# Patient Record
Sex: Male | Born: 1940 | Race: White | Hispanic: No | Marital: Married | Smoking: Never smoker
Health system: Southern US, Community
[De-identification: ages and names within clinical notes are randomized; demographics above are authoritative.]

## PROBLEM LIST (undated history)

## (undated) DIAGNOSIS — Z8546 Personal history of malignant neoplasm of prostate: Secondary | ICD-10-CM

## (undated) DIAGNOSIS — R55 Syncope and collapse: Secondary | ICD-10-CM

## (undated) DIAGNOSIS — T7840XA Allergy, unspecified, initial encounter: Secondary | ICD-10-CM

## (undated) DIAGNOSIS — E785 Hyperlipidemia, unspecified: Secondary | ICD-10-CM

## (undated) DIAGNOSIS — I2510 Atherosclerotic heart disease of native coronary artery without angina pectoris: Secondary | ICD-10-CM

## (undated) DIAGNOSIS — C801 Malignant (primary) neoplasm, unspecified: Secondary | ICD-10-CM

## (undated) DIAGNOSIS — R011 Cardiac murmur, unspecified: Secondary | ICD-10-CM

## (undated) DIAGNOSIS — N189 Chronic kidney disease, unspecified: Secondary | ICD-10-CM

## (undated) DIAGNOSIS — H269 Unspecified cataract: Secondary | ICD-10-CM

## (undated) DIAGNOSIS — Z9289 Personal history of other medical treatment: Secondary | ICD-10-CM

## (undated) DIAGNOSIS — M1990 Unspecified osteoarthritis, unspecified site: Secondary | ICD-10-CM

## (undated) DIAGNOSIS — R001 Bradycardia, unspecified: Secondary | ICD-10-CM

## (undated) DIAGNOSIS — Z923 Personal history of irradiation: Secondary | ICD-10-CM

## (undated) HISTORY — DX: Personal history of malignant neoplasm of prostate: Z85.46

## (undated) HISTORY — DX: Malignant (primary) neoplasm, unspecified: C80.1

## (undated) HISTORY — DX: Chronic kidney disease, unspecified: N18.9

## (undated) HISTORY — DX: Unspecified cataract: H26.9

## (undated) HISTORY — PX: TONSILLECTOMY: SUR1361

## (undated) HISTORY — PX: PROSTATECTOMY: SHX69

## (undated) HISTORY — PX: SKIN GRAFT: SHX250

## (undated) HISTORY — PX: ROBOT ASSISTED LAPAROSCOPIC RADICAL PROSTATECTOMY: SHX5141

## (undated) HISTORY — DX: Cardiac murmur, unspecified: R01.1

## (undated) HISTORY — DX: Allergy, unspecified, initial encounter: T78.40XA

## (undated) HISTORY — DX: Unspecified osteoarthritis, unspecified site: M19.90

## (undated) HISTORY — PX: SHOULDER SURGERY: SHX246

---

## 1999-08-05 ENCOUNTER — Encounter: Payer: Self-pay | Admitting: Neurological Surgery

## 1999-08-05 ENCOUNTER — Ambulatory Visit (HOSPITAL_COMMUNITY): Admission: RE | Admit: 1999-08-05 | Discharge: 1999-08-05 | Payer: Self-pay | Admitting: Neurological Surgery

## 2005-01-30 ENCOUNTER — Ambulatory Visit: Payer: Self-pay

## 2005-08-02 ENCOUNTER — Ambulatory Visit: Payer: Self-pay | Admitting: Gastroenterology

## 2005-08-12 ENCOUNTER — Encounter (INDEPENDENT_AMBULATORY_CARE_PROVIDER_SITE_OTHER): Payer: Self-pay | Admitting: Specialist

## 2005-08-12 ENCOUNTER — Ambulatory Visit: Payer: Self-pay | Admitting: Gastroenterology

## 2006-02-04 ENCOUNTER — Ambulatory Visit: Payer: Self-pay

## 2006-04-02 ENCOUNTER — Ambulatory Visit: Payer: Self-pay | Admitting: Cardiology

## 2006-11-15 ENCOUNTER — Ambulatory Visit (HOSPITAL_COMMUNITY): Admission: RE | Admit: 2006-11-15 | Discharge: 2006-11-15 | Payer: Self-pay | Admitting: Gastroenterology

## 2007-01-08 ENCOUNTER — Ambulatory Visit (HOSPITAL_COMMUNITY): Admission: RE | Admit: 2007-01-08 | Discharge: 2007-01-08 | Payer: Self-pay | Admitting: Urology

## 2007-01-28 ENCOUNTER — Ambulatory Visit (HOSPITAL_COMMUNITY): Admission: RE | Admit: 2007-01-28 | Discharge: 2007-01-28 | Payer: Self-pay | Admitting: Urology

## 2007-02-06 ENCOUNTER — Encounter (INDEPENDENT_AMBULATORY_CARE_PROVIDER_SITE_OTHER): Payer: Self-pay | Admitting: Urology

## 2007-02-06 ENCOUNTER — Inpatient Hospital Stay (HOSPITAL_COMMUNITY): Admission: RE | Admit: 2007-02-06 | Discharge: 2007-02-07 | Payer: Self-pay | Admitting: Urology

## 2008-03-22 ENCOUNTER — Ambulatory Visit: Admission: RE | Admit: 2008-03-22 | Discharge: 2008-06-20 | Payer: Self-pay | Admitting: Radiation Oncology

## 2010-05-29 ENCOUNTER — Encounter (INDEPENDENT_AMBULATORY_CARE_PROVIDER_SITE_OTHER): Payer: Self-pay | Admitting: *Deleted

## 2010-06-22 ENCOUNTER — Encounter (INDEPENDENT_AMBULATORY_CARE_PROVIDER_SITE_OTHER): Payer: Self-pay | Admitting: *Deleted

## 2010-06-26 ENCOUNTER — Ambulatory Visit: Payer: Self-pay | Admitting: Gastroenterology

## 2010-07-11 ENCOUNTER — Ambulatory Visit: Payer: Self-pay | Admitting: Gastroenterology

## 2010-07-11 DIAGNOSIS — Z8546 Personal history of malignant neoplasm of prostate: Secondary | ICD-10-CM

## 2010-07-12 LAB — CONVERTED CEMR LAB
ALT: 15 units/L (ref 0–53)
AST: 22 units/L (ref 0–37)
Albumin: 4 g/dL (ref 3.5–5.2)
Alkaline Phosphatase: 38 units/L — ABNORMAL LOW (ref 39–117)
BUN: 9 mg/dL (ref 6–23)
Basophils Absolute: 0 10*3/uL (ref 0.0–0.1)
Basophils Relative: 1.2 % (ref 0.0–3.0)
Bilirubin, Direct: 0.1 mg/dL (ref 0.0–0.3)
CO2: 30 meq/L (ref 19–32)
Calcium: 9.3 mg/dL (ref 8.4–10.5)
Chloride: 105 meq/L (ref 96–112)
Creatinine, Ser: 1.1 mg/dL (ref 0.4–1.5)
Eosinophils Absolute: 0.1 10*3/uL (ref 0.0–0.7)
Eosinophils Relative: 2.1 % (ref 0.0–5.0)
Ferritin: 69.5 ng/mL (ref 22.0–322.0)
Folate: 16.7 ng/mL
GFR calc non Af Amer: 72.98 mL/min (ref >60)
Glucose, Bld: 86 mg/dL (ref 70–99)
HCT: 42 % (ref 39.0–52.0)
Hemoglobin: 14.6 g/dL (ref 13.0–17.0)
Iron: 89 ug/dL (ref 42–165)
Lymphocytes Relative: 22.1 % (ref 12.0–46.0)
Lymphs Abs: 0.7 10*3/uL (ref 0.7–4.0)
MCHC: 34.8 g/dL (ref 30.0–36.0)
MCV: 93.4 fL (ref 78.0–100.0)
Monocytes Absolute: 0.3 10*3/uL (ref 0.1–1.0)
Monocytes Relative: 7.6 % (ref 3.0–12.0)
Neutro Abs: 2.2 10*3/uL (ref 1.4–7.7)
Neutrophils Relative %: 67 % (ref 43.0–77.0)
Platelets: 197 10*3/uL (ref 150.0–400.0)
Potassium: 5.1 meq/L (ref 3.5–5.1)
RBC: 4.5 M/uL (ref 4.22–5.81)
RDW: 13.7 % (ref 11.5–14.6)
Saturation Ratios: 29.4 % (ref 20.0–50.0)
Sodium: 141 meq/L (ref 135–145)
TSH: 1.57 microintl units/mL (ref 0.35–5.50)
Total Bilirubin: 1.1 mg/dL (ref 0.3–1.2)
Total Protein: 6.3 g/dL (ref 6.0–8.3)
Transferrin: 216.3 mg/dL (ref 212.0–360.0)
Vitamin B-12: 671 pg/mL (ref 211–911)
WBC: 3.4 10*3/uL — ABNORMAL LOW (ref 4.5–10.5)

## 2010-10-02 NOTE — Letter (Signed)
Summary: Pre Visit Letter Revised  [facility]  [address] [place], [place] [address]   Phone: [phone]  Fax: [phone]        05/29/2010 MRN: [id number] [place] Under[place] Under[place] Underwood, [place]  Underwood             Procedure Date:  07-11-10   Welcome to the Gastroenterology Division at [facility].    You are scheduled to see a nurse for your pre-procedure visit on 06-26-10 at 8:30a.m. on the 3rd floor at [facility], [address]  We ask that you try to arrive at our office 15 minutes prior to your appointment time to allow for check-in.  Please take a minute to review the attached form.  If you answer "Yes" to one or more of the questions on the first page, we ask that you call the person listed at your earliest opportunity.  If you answer "No" to all of the questions, please complete the rest of the form and bring it to your appointment.    Your nurse visit will consist of discussing your medical and surgical history, your immediate family medical history, and your medications.   If you are unable to list all of your medications on the form, please bring the medication bottles to your appointment and we will list them.  We will need to be aware of both prescribed and over the counter drugs.  We will need to know exact dosage information as well.    Please be prepared to read and sign documents such as consent forms, a financial agreement, and acknowledgement forms.  If necessary, and with your consent, a friend or relative is welcome to sit-in on the nurse visit with you.  Please bring your insurance card so that we may make a copy of it.  If your insurance requires a referral to see a specialist, please bring your referral form from your primary care physician.  No co-pay is required for this nurse visit.     If you cannot keep your appointment, please call [phone] to cancel or reschedule prior to your appointment date.  This allows [place]  the opportunity to schedule an appointment for another patient in need of care.    Thank you for choosing [facility] for your medical needs.  We appreciate the opportunity to care for you.  Please visit [place] at our website  to learn more about our practice.  Sincerely, The Gastroenterology Division

## 2010-10-02 NOTE — Miscellaneous (Signed)
Summary: [facility] Previsit/prep  Clinical Lists Changes  Medications: Added new medication of MOVIPREP 100 GM  SOLR (PEG-KCL-NACL-NASULF-NA ASC-C) As per prep instructions. - Signed Rx of MOVIPREP 100 GM  SOLR (PEG-KCL-NACL-NASULF-NA ASC-C) As per prep instructions.;  #1 x 0;  Signed;  Entered by: Wyona Almas RN;  Authorized by: Mardella Layman MD [facility];  Method used: [facility] [address] [phone]*, [address], [place], [place], [place]  [address], Ph: [phone] or [phone], Fax: [phone] Observations: Added new observation of NKA: T (06/26/2010 8:19)    Prescriptions: MOVIPREP 100 GM  SOLR (PEG-KCL-NACL-NASULF-NA ASC-C) As per prep instructions.  #1 x 0   Entered by:   Wyona Almas RN   Authorized by:   Mardella Layman MD [facility]   Signed by:   Wyona Almas RN on 06/26/2010   Method used:   [facility]        [facility] [address] [phone]* (retail)       [address]       [place], [place]  [address]       Ph: [phone] or [phone]       Fax: [phone]   RxID:   [id number]

## 2010-10-02 NOTE — Letter (Signed)
Summary: [facility] Instructions  [facility]  [address] [place], [place] [address]   Phone: [phone]  Fax: [phone]       [place]    1940/12/27    MRN: [id number]        Procedure Day /Date:  07/11/10  Wednesday     Arrival Time:  12:30pm      Procedure Time: 1:30pm     Location of Procedure:                    _ x_  [facility] (4th Floor)                        PREPARATION FOR COLONOSCOPY WITH MOVIPREP   Starting 5 days prior to your procedure _11/4/11 _ do not eat nuts, seeds, popcorn, corn, beans, peas,  salads, or any raw vegetables.  Do not take any fiber supplements (e.g. Metamucil, Citrucel, and Benefiber).  THE DAY BEFORE YOUR PROCEDURE         DATE:  07/10/10 DAY: Tuesday  1.  Drink clear liquids the entire day-NO SOLID FOOD  2.  Do not drink anything colored red or purple.  Avoid juices with pulp.  No orange juice.  3.  Drink at least 64 oz. (8 glasses) of fluid/clear liquids during the day to prevent dehydration and help the prep work efficiently.  CLEAR LIQUIDS INCLUDE: Water Jello Ice Popsicles Tea (sugar ok, no milk/cream) Powdered fruit flavored drinks Coffee (sugar ok, no milk/cream) Gatorade Juice: apple, white grape, white cranberry  Lemonade Clear bullion, consomm, broth Carbonated beverages (any kind) Strained chicken noodle soup Hard Candy                             4.  In the morning, mix first dose of MoviPrep solution:    Empty 1 Pouch A and 1 Pouch B into the disposable container    Add lukewarm drinking water to the top line of the container. Mix to dissolve    Refrigerate (mixed solution should be used within 24 hrs)  5.  Begin drinking the prep at 5:00 p.m. The MoviPrep container is divided by 4 marks.   Every 15 minutes drink the solution down to the next mark (approximately 8 oz) until the full liter is complete.   6.  Follow completed prep with 16 oz of clear liquid of your choice  (Nothing red or purple).  Continue to drink clear liquids until bedtime.  7.  Before going to bed, mix second dose of MoviPrep solution:    Empty 1 Pouch A and 1 Pouch B into the disposable container    Add lukewarm drinking water to the top line of the container. Mix to dissolve    Refrigerate  THE DAY OF YOUR PROCEDURE      DATE:  07/11/10  DAY:  Wednesday  Beginning at  8:30a.m. (5 hours before procedure):         1. Every 15 minutes, drink the solution down to the next mark (approx 8 oz) until the full liter is complete.  2. Follow completed prep with 16 oz. of clear liquid of your choice.    3. You may drink clear liquids until  11:30am  (2 HOURS BEFORE PROCEDURE).   MEDICATION INSTRUCTIONS  Unless otherwise instructed, you should take regular prescription medications with a small sip of water  as early as possible the morning of your procedure.        OTHER INSTRUCTIONS  You will need a responsible adult at least 69 years of age to accompany you and drive you home.   This person must remain in the waiting room during your procedure.  Wear loose fitting clothing that is easily removed.  Leave jewelry and other valuables at home.  However, you may wish to bring a book to read or  an iPod/MP3 player to listen to music as you wait for your procedure to start.  Remove all body piercing jewelry and leave at home.  Total time from sign-in until discharge is approximately 2-3 hours.  You should go home directly after your procedure and rest.  You can resume normal activities the  day after your procedure.  The day of your procedure you should not:   Drive   Make legal decisions   Operate machinery   Drink alcohol   Return to work  You will receive specific instructions about eating, activities and medications before you leave.    The above instructions have been reviewed and explained to me by  Wyona Almas RN  June 26, 2010 8:52 AM     I fully  understand and can verbalize these instructions _____________________________ Date _________

## 2010-10-02 NOTE — Procedures (Signed)
Summary: Colonoscopy  Patient: Coleby Yett Note: All result statuses are Final unless otherwise noted.  Tests: (1) Colonoscopy (COL)   COL Colonoscopy           DONE (C)     [facility]     [address]     [place], [place]  [address]           COLONOSCOPY PROCEDURE REPORT           PATIENT:  Anuar, Walgren  MR#:  [id number]     BIRTHDATE:  April 17, 1941, 69 yrs. old  GENDER:  male     ENDOSCOPIST:  Vania Rea. Jarold Motto, MD, [facility]     REF. BY:  Vania Rea. Jarold Motto, M.D.     PROCEDURE DATE:  07/11/2010     PROCEDURE:  Higher-risk screening colonoscopy G0105           ASA CLASS:  Class II     INDICATIONS:  Elevated Risk Screening AUNT WITH COLON CA.HX. OF     PROSRATE CA AND PRIOR SURGERY, AND EXTERNAL BEAM RADIATION.     MEDICATIONS:   Fentanyl 100 mcg IV, Versed 9 mg IV           DESCRIPTION OF PROCEDURE:   After the risks benefits and     alternatives of the procedure were thoroughly explained, informed     consent was obtained.  Digital rectal exam was performed and     revealed no abnormalities.   The LB 180AL [id number] endoscope was     introduced through the anus and advanced to the cecum, which was     identified by both the appendix and ileocecal valve, without     limitations.  The quality of the prep was good, using MoviPrep.     The instrument was then slowly withdrawn as the colon was fully     examined.     <<PROCEDUREIMAGES>>           FINDINGS:  No polyps or cancers were seen.  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     VASCULAR TELANGIECTASIAE IN DISTAL 5 CM. OF RECTUM.NO BLEEDING OR     FRIABILITY NOTED.SEE PICTURES.   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    The scope was then withdrawn from     the patient and the procedure completed.           COMPLICATIONS:  None     ENDOSCOPIC IMPRESSION:     1) No polyps or cancers     2) Radiation proctitis     RECOMMENDATIONS:  1) Continue current colorectal screening recommendations for     "routine risk" patients with a repeat colonoscopy in 10 years.     3 NEGATIVE COLON EXAMS OVER 17 YEARS.     REPEAT EXAM:  No           ______________________________     Vania Rea. Jarold Motto, MD, [facility]           CC:  Rodrigo Ran, MD           n.     REVISED:  07/11/2010 02:15 PM     eSIGNED:   Vania Rea. Olivya Sobol at 07/11/2010 02:15 PM           [name], [id number]  Note: An exclamation mark (!) indicates a result that was not dispersed into the flowsheet. 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) Orders: Added new Test order of TLB-B12, Serum-Total ONLY [phone]) - Signed Added new Test order of TLB-Ferritin (82728-FER) - Signed Added new Test order of TLB-Folic Acid (Folate) (82746-FOL) - Signed Added new Test order of TLB-Iron, (Fe) Total (83540-FE) - Signed Added new Test order of TLB-IBC Pnl (Iron/FE;Transferrin) (83550-IBC) - Signed Added new Test order of TLB-CBC Platelet - w/Differential (85025-CBCD) - Signed Added new Test order of TLB-TSH (Thyroid Stimulating Hormone) (84443-TSH) - Signed Added new Test order of TLB-BMP (Basic Metabolic Panel-BMET) (80048-METABOL) - Signed Added new Test order of TLB-Hepatic/Liver Function Pnl (80076-HEPATIC) - Signed      Appended Document: Colonoscopy    Clinical Lists Changes  Observations: Added new observation of COLONNXTDUE: 07/2020  (07/11/2010 15:08)

## 2010-12-18 ENCOUNTER — Other Ambulatory Visit: Payer: Self-pay | Admitting: Gastroenterology

## 2010-12-18 MED ORDER — MESALAMINE 1000 MG RE SUPP: 1000.0000 mg | Freq: Every day | RECTAL | Status: DC

## 2010-12-18 NOTE — Telephone Encounter (Signed)
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 I prescribed Canasa 1 g suppositories at bedtime as needed

## 2011-01-15 NOTE — Op Note (Signed)
NAME:  [place], [place] Under[place]              ACCOUNT NO.:  [id number]   MEDICAL RECORD NO.:  [id number]          PATIENT TYPE:  INP   LOCATION:  0001                         FACILITY:  [facility]   PHYSICIAN:  Ronald L. Earlene Plater, M.D.  DATE OF BIRTH:  1940/11/25   DATE OF PROCEDURE:  02/06/2007  DATE OF DISCHARGE:                               OPERATIVE REPORT   PREOPERATIVE DIAGNOSIS:  Adenocarcinoma of the prostate.   POSTOPERATIVE DIAGNOSIS:  Adenocarcinoma of the prostate.   OPERATIVE PROCEDURE:  Robotic radical prostatectomy with bilateral  pelvic lymphadenectomy.   SURGEON:  Lucrezia Starch. Earlene Plater, M.D.   ASSISTANT:  Heloise Purpura, M.D.   ANESTHESIA:  General endotracheal.   ESTIMATED BLOOD LOSS:  150 mL.   TUBES:  20-French Coude Foley catheter and large round Blake drain.   COMPLICATIONS:  None.   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  On  examination he had some firmness on the right apical and middle portion  of his prostate.  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  Metastatic workup was normal,  and after  understanding the risks, benefits, and alternatives, he has elected to  proceed with the above procedure.   DESCRIPTION OF PROCEDURE:  The patient was placed in the supine position  after proper general endotracheal anesthesia.  He was placed in  exaggerated lithotomy position and prepped and draped with Betadine in a  sterile fashion.  A 22-French Foley catheter was inserted.  A 30 mL  balloon in the bladder was drained.   The left periumbilical incision approximately 1.5 cm in diameter was  made.  With sharp dissection, this was carried down.  The anterior  rectus sheath was incised.  The peritoneum was punctured, and a 12-mm  port was placed and the peritoneum  was insufflated with carbon dioxide.  Next under direct vision, right and left robotic arm ports were placed  for 8 mm under direct vision as was the fourth arm port lateral to the  left arm port.  Two working ports were placed lateral and medial to the  right robotic arm port.  The right port was 12 mm and the left port was  5 mm.  Each had been injected with Xylocaine with epinephrine  previously, and the spot was visualized intraperitoneally.  The machine  was connected and the dissection was begun.  The bladder was filled with  approximately 200 mL of sterile water.  The anterior bladder flap was  taken down.  The space of Retzius was entered and dissected free of the  prostate.  The endopelvic fascia was taken down bilaterally.  The  puboprostatic ligaments were partially incised.  The pudendal vessels  were taken down lateral to the apex of the prostate, and the dorsal vein  complex was clipped with an Endo GIA staple, creating mobility of the  prostate.  Next, the bladder neck was approached and resected sharply  with  the hot scissors down to the Foley catheter.  It was used as a  traction device.  The posterior prostate was taken down.  The ampulla  and the vas deferens were dissected free and incised, and both seminal  vesicles were dissected and retracted anteriorly.  Denonvilliers' fascia  was then taken down posteriorly to the apex of prostate and endopelvic  fascia, and the lateral pedicles were developed.  Next utilizing cold  scissors, the neurovascular bundles were taken down posterolaterally  bilaterally utilizing a Vail technique, and the pedicles were taken in  serial clips with Hem-o-lock clips and taken down, maintaining a good  margin on the prostate but also maintaining the neurovascular bundles  intact.  This was taken down to the apex of the prostate.  The urethra  was sharply incised with the cold shears.  The specimen was placed in  the right lower quadrant.   Good hemostasis was noted to be present.  The  pelvis was irrigated, and the rectum was insufflated with air and there  was no leakage noted.  Next, bilateral pelvic lymphadenectomy was  performed.  The right pelvic lymphadenectomy was first performed.  All  lymphatics were clipped.  The obturator nerve was identified and  protected, and both obturator and external iliac nodes were taken and  grasped with the scoop and sent to pathology.  A similar node dissection  was performed on the left side at the apex.  The nerve was adherent to  the pedicle of the lymph nodes.  A Hem-o-lock clip was placed initially  across the nerve, was quickly removed, and the nerve was dissected off  and noted to be totally intact.  The specimen was submitted in a similar  manner.  Good hemostasis was noted to be present.  An ampule of indigo  carmine was given IV, and the anastomosis was performed.  A support  stitch was placed with a 3-0 Vicryl suture, tacking Denonvilliers'  fascia to the posterior urethral plate to decrease mobility.  A holding  stitch was then placed in the 6 o'clock position from the urethra to the  bladder neck, and the bladder neck anastomosis was performed with a  running 3-0 Monocryl suture, utilizing dyed and undyed suture with the  knot below the bladder neck.  The suture was tied anteriorly.  A 20-  French Coude catheter was passed.  The bladder was noted to irrigate  clear with blue dye.  Good hemostasis was noted to be present.  There  was no leakage.  A large round Blake drain was placed through the fourth  arm port and positioned in the pelvis.  The specimen was grasped with  the EndoCatch bag through the port, and under direct vision, the 12-mm  right working port was closed with a suture passer and 2-0 Vicryl  suture.  All ports were removed, and it was visualized that good  hemostasis was present.  The specimen was removed through the periumbilical camera port.  The fascia  was well-visualized and closed  with a running 2-0 Vicryl suture.  Each wound was thoroughly irrigated.  Good hemostasis was noted be present.  They were injected with 0.25%  Marcaine, and the skin was closed with skin staples.  The specimen was  submitted to pathology.   The patient was taken to the recovery room stable.      Ronald L. Earlene Plater, M.D.  Electronically Signed     RLD/MEDQ  D:  02/06/2007  T:  02/06/2007  Job:  [id number]

## 2011-01-18 NOTE — Assessment & Plan Note (Signed)
[facility]                            CARDIOLOGY OFFICE NOTE   [place] Under[place], [place] Underwood                       MRN:          [id number]  DATE:09/29/2006                            DOB:          11-26-1940    I spoke with [name] today about his Lipitor.  This is being  given for primary prevention.  He is having some potential side effects  from Lipitor with a low testosterone and some sexual dysfunction.  He  questions whether these effects may be related to the Lipitor and  whether his risk profile is low enough that he could come off of the  Lipitor.  We performed a risk profile on [place] Underwood through 1 of the web  sites, and his risk for cardiovascular events over the next 10 years is  relatively low (about 9%).  I think this is sufficiently low that it is  quite reasonable to stop the Lipitor, especially in view of the fact  that he is having some side effects.  I have recommended he have a  followup lipid profile in 4 to 8 weeks and we can reassess his lipid  profile and his risk at that time.     Bruce Elvera Lennox Juanda Chance, MD, [facility]  Electronically Signed    BRB/MedQ  DD: 09/29/2006  DT: 09/29/2006  Job #: [id number]

## 2011-01-18 NOTE — Assessment & Plan Note (Signed)
[facility]                              CARDIOLOGY OFFICE NOTE   MAITLAND, LESIAK                  MRN:          [id number]  DATE:04/02/2006                            DOB:          [birth year]    PRIMARY CARE PHYSICIAN:  Loraine Leriche A. Perini, MD   CLINICAL HISTORY:  S[place] Underwood is 70 years old, and has no prior history  of heart disease.  He recently developed some mild symptoms of erectile  dysfunction, and knowing the association with vascular disease, he called me  about further evaluation.  We decided to evaluate him with a stress  Cardiolite scan.  He has had no symptoms of chest pain, shortness of breath  or palpitations.   PAST MEDICAL HISTORY:  His risk factor profile is positive only in that his  father had a heart attack in his 58s, but he lived to his 40s.  He also has  hyperlipidemia, which has been treated with Lipitor.   ADDENDUM:  S[place] Underwood had a history of a heart murmur heard by Dr. Waynard Edwards in  2006, and we did an echocardiogram, and this showed only some mild  thickening of the mitral valve.   CURRENT MEDICATIONS:  Include Lipitor, Actonel, calcium, vitamins and  aspirin.   PHYSICAL EXAMINATION:  VITAL SIGNS:  Blood pressure is 126/82, the pulse is  53 and regular.  NECK:  There was no venous distention.  The carotid pulses were full, and  there were no bruits.  CHEST:  Clear.  CARDIAC:  Rhythm was regular.  The first and second heart sounds were  normal.  I can hear no murmurs or gallops.  ABDOMEN:  Soft without organomegaly.  EXTREMITIES:  Peripheral pulses were full, and there is no peripheral edema.   An electrocardiogram today was normal.   The recent stress test showed some inferior thinning, but no redistribution  and no evidence of ischemia.  The exercise capacity was excellent, and there  were no ST-T changes or chest pain.  The ejection fraction was 56%.   IMPRESSION:  1.  Nonischemic stress Myoview with no  evidence of ischemia.  2.  Hyperlipidemia, treated.  3.  Positive family history for coronary heart disease.   RECOMMENDATIONS:  I reassured [place] Underwood that his stress test looked quite good,  and there was no evidence of any ischemic heart disease.  His risk factors  are under excellent control with a very good lipid profile, which showed a  total cholesterol of 138, an HDL of 53, and an LDL of 56.  He also had a  previous Lipomed which showed a good pattern with low risk for LDL particle  number and LDL size.  His blood pressure is also under good control, and he  is at ideal weight and exercises regularly.  I reassured him that there was  no evidence of any significant vascular disease at present, and encouraged  him to continue his primary risk factor modification.  I will plan to see  him back on a p.r.n. basis.  Bruce Elvera Lennox Juanda Chance, MD, [facility]    BRB/MedQ  DD:  04/02/2006  DT:  04/03/2006  Job #:  [id number]   cc:   Loraine Leriche A. Perini, MD

## 2011-06-20 LAB — DIFFERENTIAL
Eosinophils Relative: 0
Lymphocytes Relative: 15
Lymphs Abs: 1.3
Monocytes Absolute: 0.7
Monocytes Relative: 8
Neutro Abs: 6.7

## 2011-06-20 LAB — COMPREHENSIVE METABOLIC PANEL
Alkaline Phosphatase: 39
BUN: 17
Glucose, Bld: 109 — ABNORMAL HIGH
Potassium: 4.5
Total Protein: 7

## 2011-06-20 LAB — CBC
HCT: 38.9 — ABNORMAL LOW
HCT: 43.8
Hemoglobin: 13.3
Hemoglobin: 14.8
MCHC: 33.9
RBC: 4.26
RDW: 14
RDW: 14.1 — ABNORMAL HIGH
WBC: 8.8

## 2011-06-20 LAB — BASIC METABOLIC PANEL
Calcium: 8 — ABNORMAL LOW
GFR calc Af Amer: >60
GFR calc non Af Amer: >60
Glucose, Bld: 123 — ABNORMAL HIGH
Potassium: 3.7
Sodium: 135

## 2011-06-20 LAB — PROTIME-INR
INR: 1
Prothrombin Time: 13.1

## 2011-06-20 LAB — TYPE AND SCREEN

## 2011-08-15 ENCOUNTER — Ambulatory Visit (INDEPENDENT_AMBULATORY_CARE_PROVIDER_SITE_OTHER): Payer: Medicare Other | Admitting: Cardiovascular Disease

## 2011-08-15 ENCOUNTER — Encounter: Payer: Self-pay | Admitting: Cardiovascular Disease

## 2011-08-15 VITALS — BP 136/82 | HR 58 | Resp 18 | Ht 70.0 in | Wt 166.1 lb

## 2011-08-15 DIAGNOSIS — E785 Hyperlipidemia, unspecified: Secondary | ICD-10-CM | POA: Insufficient documentation

## 2011-08-15 DIAGNOSIS — I2510 Atherosclerotic heart disease of native coronary artery without angina pectoris: Secondary | ICD-10-CM

## 2011-08-15 MED ORDER — ATORVASTATIN CALCIUM 10 MG PO TABS: 10.0000 mg | ORAL_TABLET | Freq: Every day | ORAL | Status: DC

## 2011-08-15 NOTE — Patient Instructions (Signed)
Your physician recommends that you schedule a follow-up appointment with Dr. Excell Seltzer as needed after [facility].  Your physician has requested that you have en exercise stress myoview. For further information please visit [email or web address] Please follow instruction sheet, as given.

## 2011-08-15 NOTE — Progress Notes (Signed)
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 He is a very nice 70 year old gentleman who underwent a CT scan of the chest, abdomen, and pelvis approximately one month ago. This was done to followup prostate cancer. On that study there was incidental notation of "atheromatous plaque identified along the course of the coronary vessels." He presents today for further evaluation.  The patient is completely asymptomatic from a cardiovascular perspective. He is physically active with regular exercise. He is able to carry a golf bag and walk 18 holes. He also does aerobic exercise at the gym. He specifically denies exertional chest pain or pressure. He denies orthopnea, PND, palpitations, lightheadedness, or syncope. He follows a healthy diet.  The patient brings in recent lab work which shows a fasting glucose of 88, total cholesterol 216, triglycerides 84, HDL cholesterol 59, and LDL cholesterol 140.  Outpatient Encounter Prescriptions as of 08/15/2011  Medication Sig Dispense Refill  . aspirin 81 MG tablet Take 81 mg by mouth daily.        . calcium-vitamin D (OSCAL WITH D) 500-200 MG-UNIT per tablet Take 1 tablet by mouth daily.        . Misc Natural Products (OSTEO BI-FLEX ADV DOUBLE ST PO) Take by mouth.        . Misc Natural Products (RED WINE EXTRACT PO) Take by mouth.        . STUDY MEDICATION Drug: [place]  @@ [facility].       . vitamin E (VITAMIN E) 1000 UNIT capsule Take 1,000 Units by mouth daily.        Marland Kitchen zolpidem (AMBIEN) 10 MG tablet Take 10 mg by mouth as needed.       Marland Kitchen atorvastatin (LIPITOR) 10 MG tablet Take 1 tablet (10 mg total) by mouth daily.  30 tablet  11  . mesalamine (CANASA) 1000 MG suppository Place 1 suppository (1,000 mg total) rectally at bedtime.  30 suppository  1    Review of patient's allergies indicates no known allergies.  Past Medical History  Diagnosis Date  . Personal history of malignant neoplasm of prostate     No  past surgical history on file.  History   Social History  . Marital Status: Married    Spouse Name: N/A    Number of Children: N/A  . Years of Education: N/A   Occupational History  . Not on file.   Social History Main Topics  . Smoking status: Never Smoker   . Smokeless tobacco: Not on file  . Alcohol Use: Not on file  . Drug Use: Not on file  . Sexually Active: Not on file   Other Topics Concern  . Not on file   Social History Narrative  . No narrative on file    No family history on file.  ROS: General: no fevers/chills/night sweats Eyes: no blurry vision, diplopia, or amaurosis ENT: no sore throat or hearing loss Resp: no cough, wheezing, or hemoptysis CV: no edema or palpitations GI: no abdominal pain, nausea, vomiting, diarrhea, or constipation GU: no dysuria, frequency, or hematuria Skin: no rash Neuro: no headache, numbness, tingling, or weakness of extremities Musculoskeletal: no joint pain or swelling Heme: no bleeding, DVT, or easy bruising Endo: no polydipsia or polyuria  BP 136/82  Pulse 58  Resp 18  Ht 5\' 10"  (1.778 m)  Wt 75.352 kg (166 lb 1.9 oz)  BMI 23.84 kg/m2  PHYSICAL EXAM: Pt is alert and oriented, Physically fit, WD, WN,  in no distress. HEENT: normal Neck: JVP normal. Carotid upstrokes normal without bruits. No thyromegaly. Lungs: equal expansion, clear bilaterally CV: Apex is discrete and nondisplaced, RRR with a grade 2/6 systolic ejection murmur at the left sternal border Abd: soft, NT, +BS, no bruit, no hepatosplenomegaly Back: no CVA tenderness Ext: no C/C/E        Femoral pulses 2+= without bruits        DP/PT pulses intact and = Skin: warm and dry without rash Neuro: CNII-XII intact             Strength intact = bilaterally  EKG:  Sinus bradycardia 58 beats per minute, otherwise within normal limits.  ASSESSMENT AND PLAN:

## 2011-08-15 NOTE — Assessment & Plan Note (Signed)
This is a 70 year old gentleman with subclinical coronary atherosclerosis. We discussed the implications of this. He has previously been on atorvastatin and tolerated it relatively well. This was discontinued several years ago because overall benefit was questioned. Now that the patient has a diagnosis of coronary disease, I would recommend treating him to a goal LDL of less than 100. We discussed a trial of low dose atorvastatin 10 mg. His lipids are followed regularly by Dr. Waynard Edwards.  I have also recommended an exercise nuclear scan to rule out silent ischemia in this patient with CT evidence of coronary atherosclerosis. I will plan on seeing him back as needed as long as his nuclear study is normal. I would be happy to see him back in the future if problems arise.

## 2011-08-20 ENCOUNTER — Encounter (HOSPITAL_COMMUNITY): Payer: Medicare Other | Admitting: Radiology

## 2011-08-21 ENCOUNTER — Ambulatory Visit (HOSPITAL_COMMUNITY): Payer: Medicare Other | Attending: Cardiology | Admitting: Radiology

## 2011-08-21 VITALS — BP 126/70 | Ht 70.0 in | Wt 158.0 lb

## 2011-08-21 DIAGNOSIS — Z8249 Family history of ischemic heart disease and other diseases of the circulatory system: Secondary | ICD-10-CM | POA: Insufficient documentation

## 2011-08-21 DIAGNOSIS — R9439 Abnormal result of other cardiovascular function study: Secondary | ICD-10-CM

## 2011-08-21 DIAGNOSIS — I2510 Atherosclerotic heart disease of native coronary artery without angina pectoris: Secondary | ICD-10-CM | POA: Insufficient documentation

## 2011-08-21 DIAGNOSIS — E785 Hyperlipidemia, unspecified: Secondary | ICD-10-CM | POA: Insufficient documentation

## 2011-08-21 DIAGNOSIS — I4949 Other premature depolarization: Secondary | ICD-10-CM

## 2011-08-21 MED ORDER — TECHNETIUM TC 99M TETROFOSMIN IV KIT
11.0000 | PACK | Freq: Once | INTRAVENOUS | Status: AC | PRN
  Administered 2011-08-21: 11 via INTRAVENOUS

## 2011-08-21 MED ORDER — TECHNETIUM TC 99M TETROFOSMIN IV KIT
33.0000 | PACK | Freq: Once | INTRAVENOUS | Status: AC | PRN
  Administered 2011-08-21: 33 via INTRAVENOUS

## 2011-08-21 NOTE — Progress Notes (Signed)
[facility] SITE 3 NUCLEAR MED [address] [place] [address] [phone]  Cardiology Nuclear Med Study  Spencer Underwood is a 70 y.o. male [id number] 17-Feb-1941   Nuclear Med Background Indication for Stress Test:  Evaluation for Ischemia and Recent CT scan with evidence of coronary atherosclerosis History:  '06 Echo:Normal LVF; '07 MPS:No ischemia, EF=56% Cardiac Risk Factors: Family History - CAD and Lipids  Symptoms:  No cardiac symptoms.   Nuclear Pre-Procedure Caffeine/Decaff Intake:  None NPO After: 9:00pm   Lungs:  Clear IV 0.9% NS with Angio Cath:  20g  IV Site: L Antecubital x 1, tolerated well IV Started by:  Irean Hong, RN  Chest Size (in):  40 Cup Size: N/A  Height: 5\' 10"  (1.778 m)  Weight:  158 lb (71.668 kg)  BMI:  Body mass index is 22.67 kg/(m^2). Tech Comments:  N/A    Nuclear Med Study 1 or 2 day study: 1 day  Stress Test Type:  Stress  Reading MD: Willa Rough, MD  Order Authorizing Provider:  Tonny Bollman, MD  Resting Radionuclide: Technetium 23m Tetrofosmin  Resting Radionuclide Dose: 11.0 mCi   Stress Radionuclide:  Technetium 75m Tetrofosmin  Stress Radionuclide Dose: 33.0 mCi           Stress Protocol Rest HR: 44 Stress HR: 131  Rest BP: Sitting:126/70  Standing:124/85 Stress BP: 161/72  Exercise Time (min): 12:01 METS: 13.4   Predicted Max HR: 151 bpm % Max HR: 86.75 bpm Rate Pressure Product: 52841   Dose of Adenosine (mg):  n/a Dose of Lexiscan: n/a mg  Dose of Atropine (mg): n/a Dose of Dobutamine: n/a mcg/kg/min (at max HR)  Stress Test Technologist: Smiley Houseman, CMA-N  Nuclear Technologist:  Domenic Polite, CNMT     Rest Procedure:  Myocardial perfusion imaging was performed at rest 45 minutes following the intravenous administration of Technetium 75m Tetrofosmin.   Rest ECG: Marked sinus bradycardia.  Stress Procedure:  The patient exercised for twelve minutes on the treadmill utilizing the  Bruce protocol.  The patient stopped due to fatigue and denied any chest pain.  There were no diagnostic ST-T wave changes.  There were occasional PAC's.  Technetium 19m Tetrofosmin was injected at peak exercise and myocardial perfusion imaging was performed after a brief delay.  Stress ECG: No significant change from baseline ECG  QPS Raw Data Images:  Normal; no motion artifact; normal heart/lung ratio. Stress Images:  Mild septal and inferior thinning Rest Images:  Same as stress Subtraction (SDS):  No evidence of ischemia. Transient Ischemic Dilatation (Normal <1.22):  0.94 Lung/Heart Ratio (Normal <0.45):  0.18  Quantitative Gated Spect Images QGS EDV:  109 ml QGS ESV:  41 ml QGS cine images:  Normal Wall Motion QGS EF: 63%  Impression Exercise Capacity:  Excellent exercise capacity. BP Response:  Normal blood pressure response. Clinical Symptoms:  No chest pain. ECG Impression:  No significant ST segment change suggestive of ischemia. Comparison with Prior Nuclear Study: No significant change from previous study (2007)  Overall Impression:  Normal stress nuclear study. There is mild septal and inferiior thinning. This has been seen on prior studies. There is no change. I doubt any significant abnormality.  Willa Rough, MD

## 2011-09-10 DIAGNOSIS — Z8249 Family history of ischemic heart disease and other diseases of the circulatory system: Secondary | ICD-10-CM | POA: Diagnosis not present

## 2011-10-16 DIAGNOSIS — Z006 Encounter for examination for normal comparison and control in clinical research program: Secondary | ICD-10-CM | POA: Diagnosis not present

## 2011-10-16 DIAGNOSIS — C61 Malignant neoplasm of prostate: Secondary | ICD-10-CM | POA: Diagnosis not present

## 2011-10-25 ENCOUNTER — Encounter: Payer: Self-pay | Admitting: Cardiovascular Disease

## 2011-10-25 DIAGNOSIS — E785 Hyperlipidemia, unspecified: Secondary | ICD-10-CM | POA: Diagnosis not present

## 2011-11-05 DIAGNOSIS — H259 Unspecified age-related cataract: Secondary | ICD-10-CM | POA: Diagnosis not present

## 2011-11-05 DIAGNOSIS — H52209 Unspecified astigmatism, unspecified eye: Secondary | ICD-10-CM | POA: Diagnosis not present

## 2011-11-05 DIAGNOSIS — H35379 Puckering of macula, unspecified eye: Secondary | ICD-10-CM | POA: Diagnosis not present

## 2011-11-14 DIAGNOSIS — H35379 Puckering of macula, unspecified eye: Secondary | ICD-10-CM | POA: Diagnosis not present

## 2011-11-14 DIAGNOSIS — H2510 Age-related nuclear cataract, unspecified eye: Secondary | ICD-10-CM | POA: Diagnosis not present

## 2012-01-08 DIAGNOSIS — C61 Malignant neoplasm of prostate: Secondary | ICD-10-CM | POA: Diagnosis not present

## 2012-01-08 DIAGNOSIS — Z006 Encounter for examination for normal comparison and control in clinical research program: Secondary | ICD-10-CM | POA: Diagnosis not present

## 2012-03-02 DIAGNOSIS — H2510 Age-related nuclear cataract, unspecified eye: Secondary | ICD-10-CM | POA: Diagnosis not present

## 2012-03-02 DIAGNOSIS — H35379 Puckering of macula, unspecified eye: Secondary | ICD-10-CM | POA: Diagnosis not present

## 2012-04-08 DIAGNOSIS — Z006 Encounter for examination for normal comparison and control in clinical research program: Secondary | ICD-10-CM | POA: Diagnosis not present

## 2012-04-08 DIAGNOSIS — C61 Malignant neoplasm of prostate: Secondary | ICD-10-CM | POA: Diagnosis not present

## 2012-05-12 DIAGNOSIS — Z23 Encounter for immunization: Secondary | ICD-10-CM | POA: Diagnosis not present

## 2012-05-25 DIAGNOSIS — C61 Malignant neoplasm of prostate: Secondary | ICD-10-CM | POA: Diagnosis not present

## 2012-05-25 DIAGNOSIS — Z8546 Personal history of malignant neoplasm of prostate: Secondary | ICD-10-CM | POA: Diagnosis not present

## 2012-05-25 DIAGNOSIS — Z006 Encounter for examination for normal comparison and control in clinical research program: Secondary | ICD-10-CM | POA: Diagnosis not present

## 2012-07-07 DIAGNOSIS — L821 Other seborrheic keratosis: Secondary | ICD-10-CM | POA: Diagnosis not present

## 2012-07-07 DIAGNOSIS — L570 Actinic keratosis: Secondary | ICD-10-CM | POA: Diagnosis not present

## 2012-08-03 ENCOUNTER — Other Ambulatory Visit: Payer: Self-pay | Admitting: Cardiovascular Disease

## 2012-08-10 ENCOUNTER — Other Ambulatory Visit: Payer: Self-pay | Admitting: Cardiovascular Disease

## 2012-09-07 DIAGNOSIS — H35379 Puckering of macula, unspecified eye: Secondary | ICD-10-CM | POA: Diagnosis not present

## 2012-09-07 DIAGNOSIS — H2510 Age-related nuclear cataract, unspecified eye: Secondary | ICD-10-CM | POA: Diagnosis not present

## 2012-10-29 DIAGNOSIS — C61 Malignant neoplasm of prostate: Secondary | ICD-10-CM | POA: Diagnosis not present

## 2012-10-29 DIAGNOSIS — M810 Age-related osteoporosis without current pathological fracture: Secondary | ICD-10-CM | POA: Diagnosis not present

## 2012-11-09 DIAGNOSIS — N529 Male erectile dysfunction, unspecified: Secondary | ICD-10-CM | POA: Diagnosis not present

## 2012-11-11 DIAGNOSIS — C61 Malignant neoplasm of prostate: Secondary | ICD-10-CM | POA: Diagnosis not present

## 2012-11-11 DIAGNOSIS — Z79899 Other long term (current) drug therapy: Secondary | ICD-10-CM | POA: Diagnosis not present

## 2012-11-25 DIAGNOSIS — H52209 Unspecified astigmatism, unspecified eye: Secondary | ICD-10-CM | POA: Diagnosis not present

## 2012-11-25 DIAGNOSIS — H35379 Puckering of macula, unspecified eye: Secondary | ICD-10-CM | POA: Diagnosis not present

## 2012-11-25 DIAGNOSIS — H25019 Cortical age-related cataract, unspecified eye: Secondary | ICD-10-CM | POA: Diagnosis not present

## 2013-03-08 DIAGNOSIS — H2510 Age-related nuclear cataract, unspecified eye: Secondary | ICD-10-CM | POA: Diagnosis not present

## 2013-03-08 DIAGNOSIS — H35379 Puckering of macula, unspecified eye: Secondary | ICD-10-CM | POA: Diagnosis not present

## 2013-03-16 DIAGNOSIS — L821 Other seborrheic keratosis: Secondary | ICD-10-CM | POA: Diagnosis not present

## 2013-03-16 DIAGNOSIS — L820 Inflamed seborrheic keratosis: Secondary | ICD-10-CM | POA: Diagnosis not present

## 2013-04-07 ENCOUNTER — Other Ambulatory Visit: Payer: Self-pay | Admitting: *Deleted

## 2013-05-12 DIAGNOSIS — C61 Malignant neoplasm of prostate: Secondary | ICD-10-CM | POA: Diagnosis not present

## 2013-05-13 DIAGNOSIS — Z23 Encounter for immunization: Secondary | ICD-10-CM | POA: Diagnosis not present

## 2013-07-19 DIAGNOSIS — L570 Actinic keratosis: Secondary | ICD-10-CM | POA: Diagnosis not present

## 2013-07-19 DIAGNOSIS — L821 Other seborrheic keratosis: Secondary | ICD-10-CM | POA: Diagnosis not present

## 2013-07-19 DIAGNOSIS — D239 Other benign neoplasm of skin, unspecified: Secondary | ICD-10-CM | POA: Diagnosis not present

## 2013-07-19 DIAGNOSIS — D1801 Hemangioma of skin and subcutaneous tissue: Secondary | ICD-10-CM | POA: Diagnosis not present

## 2013-07-19 DIAGNOSIS — L738 Other specified follicular disorders: Secondary | ICD-10-CM | POA: Diagnosis not present

## 2013-07-19 DIAGNOSIS — L819 Disorder of pigmentation, unspecified: Secondary | ICD-10-CM | POA: Diagnosis not present

## 2013-09-06 DIAGNOSIS — H2510 Age-related nuclear cataract, unspecified eye: Secondary | ICD-10-CM | POA: Diagnosis not present

## 2013-09-06 DIAGNOSIS — H35379 Puckering of macula, unspecified eye: Secondary | ICD-10-CM | POA: Diagnosis not present

## 2013-09-06 DIAGNOSIS — H33109 Unspecified retinoschisis, unspecified eye: Secondary | ICD-10-CM | POA: Diagnosis not present

## 2013-11-12 DIAGNOSIS — E785 Hyperlipidemia, unspecified: Secondary | ICD-10-CM | POA: Diagnosis not present

## 2013-11-12 DIAGNOSIS — Z79899 Other long term (current) drug therapy: Secondary | ICD-10-CM | POA: Diagnosis not present

## 2013-11-12 DIAGNOSIS — Z125 Encounter for screening for malignant neoplasm of prostate: Secondary | ICD-10-CM | POA: Diagnosis not present

## 2013-11-12 DIAGNOSIS — M810 Age-related osteoporosis without current pathological fracture: Secondary | ICD-10-CM | POA: Diagnosis not present

## 2013-11-17 DIAGNOSIS — C61 Malignant neoplasm of prostate: Secondary | ICD-10-CM | POA: Diagnosis not present

## 2013-11-19 DIAGNOSIS — M810 Age-related osteoporosis without current pathological fracture: Secondary | ICD-10-CM | POA: Diagnosis not present

## 2013-11-19 DIAGNOSIS — E785 Hyperlipidemia, unspecified: Secondary | ICD-10-CM | POA: Diagnosis not present

## 2013-11-19 DIAGNOSIS — C61 Malignant neoplasm of prostate: Secondary | ICD-10-CM | POA: Diagnosis not present

## 2013-11-19 DIAGNOSIS — K520 Gastroenteritis and colitis due to radiation: Secondary | ICD-10-CM | POA: Diagnosis not present

## 2013-11-19 DIAGNOSIS — R011 Cardiac murmur, unspecified: Secondary | ICD-10-CM | POA: Diagnosis not present

## 2013-11-19 DIAGNOSIS — Z1331 Encounter for screening for depression: Secondary | ICD-10-CM | POA: Diagnosis not present

## 2013-11-19 DIAGNOSIS — Z0000 Encounter for general adult medical examination without abnormal findings: Secondary | ICD-10-CM | POA: Diagnosis not present

## 2013-11-19 DIAGNOSIS — N529 Male erectile dysfunction, unspecified: Secondary | ICD-10-CM | POA: Diagnosis not present

## 2013-11-29 DIAGNOSIS — H52209 Unspecified astigmatism, unspecified eye: Secondary | ICD-10-CM | POA: Diagnosis not present

## 2013-11-29 DIAGNOSIS — H35379 Puckering of macula, unspecified eye: Secondary | ICD-10-CM | POA: Diagnosis not present

## 2013-11-29 DIAGNOSIS — H2510 Age-related nuclear cataract, unspecified eye: Secondary | ICD-10-CM | POA: Diagnosis not present

## 2014-03-21 DIAGNOSIS — H35379 Puckering of macula, unspecified eye: Secondary | ICD-10-CM | POA: Diagnosis not present

## 2014-03-21 DIAGNOSIS — H2510 Age-related nuclear cataract, unspecified eye: Secondary | ICD-10-CM | POA: Diagnosis not present

## 2014-03-21 DIAGNOSIS — H33109 Unspecified retinoschisis, unspecified eye: Secondary | ICD-10-CM | POA: Diagnosis not present

## 2014-04-10 ENCOUNTER — Other Ambulatory Visit: Payer: Self-pay | Admitting: Cardiovascular Disease

## 2014-05-23 DIAGNOSIS — Z23 Encounter for immunization: Secondary | ICD-10-CM | POA: Diagnosis not present

## 2014-05-23 DIAGNOSIS — C61 Malignant neoplasm of prostate: Secondary | ICD-10-CM | POA: Diagnosis not present

## 2014-07-05 DIAGNOSIS — H5213 Myopia, bilateral: Secondary | ICD-10-CM | POA: Diagnosis not present

## 2014-07-05 DIAGNOSIS — H25042 Posterior subcapsular polar age-related cataract, left eye: Secondary | ICD-10-CM | POA: Diagnosis not present

## 2014-07-05 DIAGNOSIS — H35372 Puckering of macula, left eye: Secondary | ICD-10-CM | POA: Diagnosis not present

## 2014-07-07 DIAGNOSIS — H33102 Unspecified retinoschisis, left eye: Secondary | ICD-10-CM | POA: Diagnosis not present

## 2014-07-07 DIAGNOSIS — H35372 Puckering of macula, left eye: Secondary | ICD-10-CM | POA: Diagnosis not present

## 2014-07-07 DIAGNOSIS — H2513 Age-related nuclear cataract, bilateral: Secondary | ICD-10-CM | POA: Diagnosis not present

## 2014-07-11 DIAGNOSIS — C61 Malignant neoplasm of prostate: Secondary | ICD-10-CM | POA: Diagnosis not present

## 2014-07-11 DIAGNOSIS — R935 Abnormal findings on diagnostic imaging of other abdominal regions, including retroperitoneum: Secondary | ICD-10-CM | POA: Diagnosis not present

## 2014-07-11 DIAGNOSIS — Z006 Encounter for examination for normal comparison and control in clinical research program: Secondary | ICD-10-CM | POA: Diagnosis not present

## 2014-07-19 DIAGNOSIS — D225 Melanocytic nevi of trunk: Secondary | ICD-10-CM | POA: Diagnosis not present

## 2014-07-19 DIAGNOSIS — L812 Freckles: Secondary | ICD-10-CM | POA: Diagnosis not present

## 2014-07-19 DIAGNOSIS — D1801 Hemangioma of skin and subcutaneous tissue: Secondary | ICD-10-CM | POA: Diagnosis not present

## 2014-07-19 DIAGNOSIS — L853 Xerosis cutis: Secondary | ICD-10-CM | POA: Diagnosis not present

## 2014-07-19 DIAGNOSIS — L821 Other seborrheic keratosis: Secondary | ICD-10-CM | POA: Diagnosis not present

## 2014-07-19 DIAGNOSIS — L570 Actinic keratosis: Secondary | ICD-10-CM | POA: Diagnosis not present

## 2014-07-20 DIAGNOSIS — Z006 Encounter for examination for normal comparison and control in clinical research program: Secondary | ICD-10-CM | POA: Diagnosis not present

## 2014-07-20 DIAGNOSIS — C61 Malignant neoplasm of prostate: Secondary | ICD-10-CM | POA: Diagnosis not present

## 2014-08-17 DIAGNOSIS — C61 Malignant neoplasm of prostate: Secondary | ICD-10-CM | POA: Diagnosis not present

## 2014-08-17 DIAGNOSIS — Z006 Encounter for examination for normal comparison and control in clinical research program: Secondary | ICD-10-CM | POA: Diagnosis not present

## 2014-09-14 DIAGNOSIS — C61 Malignant neoplasm of prostate: Secondary | ICD-10-CM | POA: Diagnosis not present

## 2014-09-14 DIAGNOSIS — Z006 Encounter for examination for normal comparison and control in clinical research program: Secondary | ICD-10-CM | POA: Diagnosis not present

## 2014-09-29 ENCOUNTER — Other Ambulatory Visit (HOSPITAL_COMMUNITY): Payer: Self-pay | Admitting: Cardiovascular Disease

## 2014-11-09 DIAGNOSIS — Z006 Encounter for examination for normal comparison and control in clinical research program: Secondary | ICD-10-CM | POA: Diagnosis not present

## 2014-11-09 DIAGNOSIS — C61 Malignant neoplasm of prostate: Secondary | ICD-10-CM | POA: Diagnosis not present

## 2014-11-09 DIAGNOSIS — R232 Flushing: Secondary | ICD-10-CM | POA: Diagnosis not present

## 2014-11-10 DIAGNOSIS — H25042 Posterior subcapsular polar age-related cataract, left eye: Secondary | ICD-10-CM | POA: Diagnosis not present

## 2014-11-10 DIAGNOSIS — H25812 Combined forms of age-related cataract, left eye: Secondary | ICD-10-CM | POA: Diagnosis not present

## 2014-12-05 DIAGNOSIS — E785 Hyperlipidemia, unspecified: Secondary | ICD-10-CM | POA: Diagnosis not present

## 2014-12-05 DIAGNOSIS — R7301 Impaired fasting glucose: Secondary | ICD-10-CM | POA: Diagnosis not present

## 2014-12-05 DIAGNOSIS — M810 Age-related osteoporosis without current pathological fracture: Secondary | ICD-10-CM | POA: Diagnosis not present

## 2014-12-05 DIAGNOSIS — Z125 Encounter for screening for malignant neoplasm of prostate: Secondary | ICD-10-CM | POA: Diagnosis not present

## 2014-12-05 DIAGNOSIS — Z008 Encounter for other general examination: Secondary | ICD-10-CM | POA: Diagnosis not present

## 2014-12-12 DIAGNOSIS — Z0000 Encounter for general adult medical examination without abnormal findings: Secondary | ICD-10-CM | POA: Diagnosis not present

## 2014-12-12 DIAGNOSIS — D126 Benign neoplasm of colon, unspecified: Secondary | ICD-10-CM | POA: Diagnosis not present

## 2014-12-12 DIAGNOSIS — R011 Cardiac murmur, unspecified: Secondary | ICD-10-CM | POA: Diagnosis not present

## 2014-12-12 DIAGNOSIS — C61 Malignant neoplasm of prostate: Secondary | ICD-10-CM | POA: Diagnosis not present

## 2014-12-12 DIAGNOSIS — R7301 Impaired fasting glucose: Secondary | ICD-10-CM | POA: Diagnosis not present

## 2014-12-12 DIAGNOSIS — N529 Male erectile dysfunction, unspecified: Secondary | ICD-10-CM | POA: Diagnosis not present

## 2014-12-12 DIAGNOSIS — H6121 Impacted cerumen, right ear: Secondary | ICD-10-CM | POA: Diagnosis not present

## 2014-12-12 DIAGNOSIS — M810 Age-related osteoporosis without current pathological fracture: Secondary | ICD-10-CM | POA: Diagnosis not present

## 2014-12-12 DIAGNOSIS — Z6822 Body mass index (BMI) 22.0-22.9, adult: Secondary | ICD-10-CM | POA: Diagnosis not present

## 2014-12-12 DIAGNOSIS — Z1389 Encounter for screening for other disorder: Secondary | ICD-10-CM | POA: Diagnosis not present

## 2014-12-12 DIAGNOSIS — E785 Hyperlipidemia, unspecified: Secondary | ICD-10-CM | POA: Diagnosis not present

## 2014-12-12 DIAGNOSIS — D7589 Other specified diseases of blood and blood-forming organs: Secondary | ICD-10-CM | POA: Diagnosis not present

## 2014-12-12 DIAGNOSIS — K627 Radiation proctitis: Secondary | ICD-10-CM | POA: Diagnosis not present

## 2014-12-28 ENCOUNTER — Other Ambulatory Visit (HOSPITAL_COMMUNITY): Payer: Self-pay | Admitting: Cardiovascular Disease

## 2015-01-02 DIAGNOSIS — H33102 Unspecified retinoschisis, left eye: Secondary | ICD-10-CM | POA: Diagnosis not present

## 2015-01-02 DIAGNOSIS — H35372 Puckering of macula, left eye: Secondary | ICD-10-CM | POA: Diagnosis not present

## 2015-01-04 DIAGNOSIS — Z006 Encounter for examination for normal comparison and control in clinical research program: Secondary | ICD-10-CM | POA: Diagnosis not present

## 2015-01-04 DIAGNOSIS — C61 Malignant neoplasm of prostate: Secondary | ICD-10-CM | POA: Diagnosis not present

## 2015-03-08 DIAGNOSIS — H33102 Unspecified retinoschisis, left eye: Secondary | ICD-10-CM | POA: Diagnosis not present

## 2015-03-08 DIAGNOSIS — H35372 Puckering of macula, left eye: Secondary | ICD-10-CM | POA: Diagnosis not present

## 2015-03-29 DIAGNOSIS — Z79899 Other long term (current) drug therapy: Secondary | ICD-10-CM | POA: Diagnosis not present

## 2015-03-29 DIAGNOSIS — C61 Malignant neoplasm of prostate: Secondary | ICD-10-CM | POA: Diagnosis not present

## 2015-03-29 DIAGNOSIS — Z006 Encounter for examination for normal comparison and control in clinical research program: Secondary | ICD-10-CM | POA: Diagnosis not present

## 2015-04-24 DIAGNOSIS — Z006 Encounter for examination for normal comparison and control in clinical research program: Secondary | ICD-10-CM | POA: Diagnosis not present

## 2015-04-24 DIAGNOSIS — C61 Malignant neoplasm of prostate: Secondary | ICD-10-CM | POA: Diagnosis not present

## 2015-05-19 DIAGNOSIS — E785 Hyperlipidemia, unspecified: Secondary | ICD-10-CM | POA: Diagnosis not present

## 2015-05-19 DIAGNOSIS — Z23 Encounter for immunization: Secondary | ICD-10-CM | POA: Diagnosis not present

## 2015-06-28 ENCOUNTER — Encounter: Payer: Self-pay | Admitting: Internal Medicine

## 2015-06-28 NOTE — Telephone Encounter (Signed)
Error

## 2015-06-29 ENCOUNTER — Telehealth: Payer: Self-pay

## 2015-06-29 NOTE — Telephone Encounter (Signed)
Appt cancelled and recall in epic for 2021.

## 2015-06-29 NOTE — Telephone Encounter (Signed)
-----   Message from Irene Shipper, MD sent at 06/29/2015  3:37 PM EDT ----- Regarding: RE: ? 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 He would not be due for colonoscopy until 2021 previously recommended by Dr. Sharlett Iles. Please cancel his appointment in January. Thank you and convert phone note for future reference   ----- Message -----    From: Algernon Huxley, RN    Sent: 06/29/2015  10:57 AM      To: Irene Shipper, MD Subject: ? recall colon                                 Dr. Henrene Pastor,  [name] would like for you to call him on his cell phone at [phone]. 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  Thanks, [employer]

## 2015-07-19 DIAGNOSIS — Z79899 Other long term (current) drug therapy: Secondary | ICD-10-CM | POA: Diagnosis not present

## 2015-07-19 DIAGNOSIS — C61 Malignant neoplasm of prostate: Secondary | ICD-10-CM | POA: Diagnosis not present

## 2015-08-01 DIAGNOSIS — L570 Actinic keratosis: Secondary | ICD-10-CM | POA: Diagnosis not present

## 2015-08-01 DIAGNOSIS — D1801 Hemangioma of skin and subcutaneous tissue: Secondary | ICD-10-CM | POA: Diagnosis not present

## 2015-08-01 DIAGNOSIS — L812 Freckles: Secondary | ICD-10-CM | POA: Diagnosis not present

## 2015-08-01 DIAGNOSIS — L821 Other seborrheic keratosis: Secondary | ICD-10-CM | POA: Diagnosis not present

## 2015-09-06 DIAGNOSIS — H33102 Unspecified retinoschisis, left eye: Secondary | ICD-10-CM | POA: Diagnosis not present

## 2015-09-06 DIAGNOSIS — H26492 Other secondary cataract, left eye: Secondary | ICD-10-CM | POA: Diagnosis not present

## 2015-09-06 DIAGNOSIS — H35372 Puckering of macula, left eye: Secondary | ICD-10-CM | POA: Diagnosis not present

## 2015-09-06 DIAGNOSIS — H2511 Age-related nuclear cataract, right eye: Secondary | ICD-10-CM | POA: Diagnosis not present

## 2015-09-11 ENCOUNTER — Ambulatory Visit: Payer: BLUE CROSS/BLUE SHIELD | Admitting: Internal Medicine

## 2015-09-11 DIAGNOSIS — G5603 Carpal tunnel syndrome, bilateral upper limbs: Secondary | ICD-10-CM | POA: Diagnosis not present

## 2015-09-11 DIAGNOSIS — G5601 Carpal tunnel syndrome, right upper limb: Secondary | ICD-10-CM | POA: Diagnosis not present

## 2015-09-11 DIAGNOSIS — M79642 Pain in left hand: Secondary | ICD-10-CM | POA: Diagnosis not present

## 2015-09-11 DIAGNOSIS — M79641 Pain in right hand: Secondary | ICD-10-CM | POA: Diagnosis not present

## 2015-10-11 DIAGNOSIS — C61 Malignant neoplasm of prostate: Secondary | ICD-10-CM | POA: Diagnosis not present

## 2016-01-09 ENCOUNTER — Encounter: Payer: Self-pay | Admitting: Gastroenterology

## 2016-01-17 DIAGNOSIS — C61 Malignant neoplasm of prostate: Secondary | ICD-10-CM | POA: Diagnosis not present

## 2016-01-19 DIAGNOSIS — M810 Age-related osteoporosis without current pathological fracture: Secondary | ICD-10-CM | POA: Diagnosis not present

## 2016-01-19 DIAGNOSIS — R7301 Impaired fasting glucose: Secondary | ICD-10-CM | POA: Diagnosis not present

## 2016-01-19 DIAGNOSIS — E784 Other hyperlipidemia: Secondary | ICD-10-CM | POA: Diagnosis not present

## 2016-01-31 DIAGNOSIS — Z0000 Encounter for general adult medical examination without abnormal findings: Secondary | ICD-10-CM | POA: Diagnosis not present

## 2016-01-31 DIAGNOSIS — C61 Malignant neoplasm of prostate: Secondary | ICD-10-CM | POA: Diagnosis not present

## 2016-01-31 DIAGNOSIS — R7301 Impaired fasting glucose: Secondary | ICD-10-CM | POA: Diagnosis not present

## 2016-01-31 DIAGNOSIS — J3089 Other allergic rhinitis: Secondary | ICD-10-CM | POA: Diagnosis not present

## 2016-01-31 DIAGNOSIS — E784 Other hyperlipidemia: Secondary | ICD-10-CM | POA: Diagnosis not present

## 2016-01-31 DIAGNOSIS — Z1389 Encounter for screening for other disorder: Secondary | ICD-10-CM | POA: Diagnosis not present

## 2016-01-31 DIAGNOSIS — N528 Other male erectile dysfunction: Secondary | ICD-10-CM | POA: Diagnosis not present

## 2016-01-31 DIAGNOSIS — M810 Age-related osteoporosis without current pathological fracture: Secondary | ICD-10-CM | POA: Diagnosis not present

## 2016-01-31 DIAGNOSIS — K627 Radiation proctitis: Secondary | ICD-10-CM | POA: Diagnosis not present

## 2016-01-31 DIAGNOSIS — Z6823 Body mass index (BMI) 23.0-23.9, adult: Secondary | ICD-10-CM | POA: Diagnosis not present

## 2016-01-31 DIAGNOSIS — D7589 Other specified diseases of blood and blood-forming organs: Secondary | ICD-10-CM | POA: Diagnosis not present

## 2016-01-31 DIAGNOSIS — R011 Cardiac murmur, unspecified: Secondary | ICD-10-CM | POA: Diagnosis not present

## 2016-02-01 DIAGNOSIS — Z1212 Encounter for screening for malignant neoplasm of rectum: Secondary | ICD-10-CM | POA: Diagnosis not present

## 2016-03-04 DIAGNOSIS — H26492 Other secondary cataract, left eye: Secondary | ICD-10-CM | POA: Diagnosis not present

## 2016-03-04 DIAGNOSIS — C61 Malignant neoplasm of prostate: Secondary | ICD-10-CM | POA: Diagnosis not present

## 2016-03-04 DIAGNOSIS — H35372 Puckering of macula, left eye: Secondary | ICD-10-CM | POA: Diagnosis not present

## 2016-03-04 DIAGNOSIS — H5213 Myopia, bilateral: Secondary | ICD-10-CM | POA: Diagnosis not present

## 2016-03-04 DIAGNOSIS — H2511 Age-related nuclear cataract, right eye: Secondary | ICD-10-CM | POA: Diagnosis not present

## 2016-03-06 DIAGNOSIS — H33102 Unspecified retinoschisis, left eye: Secondary | ICD-10-CM | POA: Diagnosis not present

## 2016-03-06 DIAGNOSIS — H33309 Unspecified retinal break, unspecified eye: Secondary | ICD-10-CM | POA: Diagnosis not present

## 2016-03-06 DIAGNOSIS — H35372 Puckering of macula, left eye: Secondary | ICD-10-CM | POA: Diagnosis not present

## 2016-03-20 DIAGNOSIS — C61 Malignant neoplasm of prostate: Secondary | ICD-10-CM | POA: Diagnosis not present

## 2016-04-23 DIAGNOSIS — Z9079 Acquired absence of other genital organ(s): Secondary | ICD-10-CM | POA: Diagnosis not present

## 2016-04-23 DIAGNOSIS — C61 Malignant neoplasm of prostate: Secondary | ICD-10-CM | POA: Diagnosis not present

## 2016-04-24 DIAGNOSIS — C61 Malignant neoplasm of prostate: Secondary | ICD-10-CM | POA: Diagnosis not present

## 2016-05-27 DIAGNOSIS — H35372 Puckering of macula, left eye: Secondary | ICD-10-CM | POA: Diagnosis not present

## 2016-05-27 DIAGNOSIS — Z09 Encounter for follow-up examination after completed treatment for conditions other than malignant neoplasm: Secondary | ICD-10-CM | POA: Diagnosis not present

## 2016-06-01 DIAGNOSIS — Z23 Encounter for immunization: Secondary | ICD-10-CM | POA: Diagnosis not present

## 2016-06-26 DIAGNOSIS — C61 Malignant neoplasm of prostate: Secondary | ICD-10-CM | POA: Diagnosis not present

## 2016-08-02 DIAGNOSIS — D225 Melanocytic nevi of trunk: Secondary | ICD-10-CM | POA: Diagnosis not present

## 2016-08-02 DIAGNOSIS — L821 Other seborrheic keratosis: Secondary | ICD-10-CM | POA: Diagnosis not present

## 2016-08-02 DIAGNOSIS — L570 Actinic keratosis: Secondary | ICD-10-CM | POA: Diagnosis not present

## 2016-08-02 DIAGNOSIS — D1801 Hemangioma of skin and subcutaneous tissue: Secondary | ICD-10-CM | POA: Diagnosis not present

## 2016-08-21 DIAGNOSIS — C61 Malignant neoplasm of prostate: Secondary | ICD-10-CM | POA: Diagnosis not present

## 2016-09-04 DIAGNOSIS — H35372 Puckering of macula, left eye: Secondary | ICD-10-CM | POA: Diagnosis not present

## 2016-09-04 DIAGNOSIS — H33302 Unspecified retinal break, left eye: Secondary | ICD-10-CM | POA: Diagnosis not present

## 2016-09-04 DIAGNOSIS — H26492 Other secondary cataract, left eye: Secondary | ICD-10-CM | POA: Diagnosis not present

## 2016-09-04 DIAGNOSIS — H40003 Preglaucoma, unspecified, bilateral: Secondary | ICD-10-CM | POA: Diagnosis not present

## 2016-12-16 DIAGNOSIS — Z79899 Other long term (current) drug therapy: Secondary | ICD-10-CM | POA: Diagnosis not present

## 2016-12-16 DIAGNOSIS — C61 Malignant neoplasm of prostate: Secondary | ICD-10-CM | POA: Diagnosis not present

## 2016-12-26 ENCOUNTER — Observation Stay (HOSPITAL_COMMUNITY): Payer: Medicare Other

## 2016-12-26 ENCOUNTER — Emergency Department (HOSPITAL_COMMUNITY): Payer: Medicare Other

## 2016-12-26 ENCOUNTER — Observation Stay (HOSPITAL_COMMUNITY)
Admission: EM | Admit: 2016-12-26 | Discharge: 2016-12-27 | Disposition: A | Discharge status: Home / Self Care | Payer: Medicare Other | Attending: Internal Medicine | Admitting: Internal Medicine

## 2016-12-26 ENCOUNTER — Encounter (HOSPITAL_COMMUNITY): Payer: Self-pay

## 2016-12-26 DIAGNOSIS — M50221 Other cervical disc displacement at C4-C5 level: Secondary | ICD-10-CM | POA: Diagnosis not present

## 2016-12-26 DIAGNOSIS — R7989 Other specified abnormal findings of blood chemistry: Secondary | ICD-10-CM | POA: Diagnosis not present

## 2016-12-26 DIAGNOSIS — Z79899 Other long term (current) drug therapy: Secondary | ICD-10-CM | POA: Diagnosis not present

## 2016-12-26 DIAGNOSIS — M542 Cervicalgia: Secondary | ICD-10-CM | POA: Diagnosis not present

## 2016-12-26 DIAGNOSIS — R778 Other specified abnormalities of plasma proteins: Secondary | ICD-10-CM

## 2016-12-26 DIAGNOSIS — R55 Syncope and collapse: Principal | ICD-10-CM | POA: Insufficient documentation

## 2016-12-26 DIAGNOSIS — Z8249 Family history of ischemic heart disease and other diseases of the circulatory system: Secondary | ICD-10-CM

## 2016-12-26 DIAGNOSIS — Z8546 Personal history of malignant neoplasm of prostate: Secondary | ICD-10-CM | POA: Diagnosis not present

## 2016-12-26 DIAGNOSIS — E785 Hyperlipidemia, unspecified: Secondary | ICD-10-CM | POA: Diagnosis present

## 2016-12-26 HISTORY — DX: Personal history of other medical treatment: Z92.89

## 2016-12-26 HISTORY — DX: Atherosclerotic heart disease of native coronary artery without angina pectoris: I25.10

## 2016-12-26 HISTORY — DX: Personal history of irradiation: Z92.3

## 2016-12-26 HISTORY — DX: Hyperlipidemia, unspecified: E78.5

## 2016-12-26 HISTORY — DX: Bradycardia, unspecified: R00.1

## 2016-12-26 HISTORY — DX: Syncope and collapse: R55

## 2016-12-26 LAB — TROPONIN I: TROPONIN I: 0.03 ng/mL — AB (ref <0.03)

## 2016-12-26 LAB — CBG MONITORING, ED: GLUCOSE-CAPILLARY: 86 mg/dL (ref 65–99)

## 2016-12-26 LAB — CBC
HCT: 40.7 % (ref 39.0–52.0)
HEMATOCRIT: 38.5 % — AB (ref 39.0–52.0)
HEMOGLOBIN: 13.1 g/dL (ref 13.0–17.0)
Hemoglobin: 13.5 g/dL (ref 13.0–17.0)
MCH: 30.3 pg (ref 26.0–34.0)
MCH: 30.9 pg (ref 26.0–34.0)
MCHC: 33.2 g/dL (ref 30.0–36.0)
MCHC: 34 g/dL (ref 30.0–36.0)
MCV: 91.5 fL (ref 78.0–100.0)
MCV: 90.8 fL (ref 78.0–100.0)
PLATELETS: 198 10*3/uL (ref 150–400)
Platelets: 182 10*3/uL (ref 150–400)
RBC: 4.45 MIL/uL (ref 4.22–5.81)
RBC: 4.24 MIL/uL (ref 4.22–5.81)
RDW: 13.3 % (ref 11.5–15.5)
RDW: 13.4 % (ref 11.5–15.5)
WBC: 5.9 10*3/uL (ref 4.0–10.5)
WBC: 6.6 10*3/uL (ref 4.0–10.5)

## 2016-12-26 LAB — URINALYSIS, ROUTINE W REFLEX MICROSCOPIC
Bilirubin Urine: NEGATIVE
Glucose, UA: NEGATIVE mg/dL
HGB URINE DIPSTICK: NEGATIVE
Ketones, ur: NEGATIVE mg/dL
LEUKOCYTES UA: NEGATIVE
Nitrite: NEGATIVE
PROTEIN: NEGATIVE mg/dL
Specific Gravity, Urine: 1.013 (ref 1.005–1.030)
pH: 6 (ref 5.0–8.0)

## 2016-12-26 LAB — BASIC METABOLIC PANEL
Anion gap: 7 (ref 5–15)
BUN: 20 mg/dL (ref 6–20)
CALCIUM: 9.3 mg/dL (ref 8.9–10.3)
CO2: 26 mmol/L (ref 22–32)
Chloride: 105 mmol/L (ref 101–111)
Creatinine, Ser: 0.93 mg/dL (ref 0.61–1.24)
Glucose, Bld: 97 mg/dL (ref 65–99)
Potassium: 4 mmol/L (ref 3.5–5.1)
SODIUM: 138 mmol/L (ref 135–145)

## 2016-12-26 LAB — CREATININE, SERUM
Creatinine, Ser: 0.97 mg/dL (ref 0.61–1.24)
GFR calc Af Amer: >60 mL/min (ref >60)

## 2016-12-26 LAB — MAGNESIUM: Magnesium: 1.8 mg/dL (ref 1.7–2.4)

## 2016-12-26 LAB — PHOSPHORUS: Phosphorus: 3.3 mg/dL (ref 2.5–4.6)

## 2016-12-26 MED ORDER — VITAMIN D 1000 UNITS PO TABS
1000.0000 [IU] | ORAL_TABLET | Freq: Every day | ORAL | Status: DC
  Administered 2016-12-27: 1000 [IU] via ORAL
  Filled 2016-12-26 (×2): qty 1

## 2016-12-26 MED ORDER — ENOXAPARIN SODIUM 40 MG/0.4ML ~~LOC~~ SOLN
40.0000 mg | SUBCUTANEOUS | Status: DC
  Administered 2016-12-26: 40 mg via SUBCUTANEOUS
  Filled 2016-12-26 (×2): qty 0.4

## 2016-12-26 MED ORDER — ZOLPIDEM TARTRATE 5 MG PO TABS: 10.0000 mg | ORAL_TABLET | Freq: Every evening | ORAL | Status: DC | PRN

## 2016-12-26 MED ORDER — ASPIRIN EC 81 MG PO TBEC
81.0000 mg | DELAYED_RELEASE_TABLET | Freq: Every day | ORAL | Status: DC
  Administered 2016-12-27: 81 mg via ORAL
  Filled 2016-12-26: qty 1

## 2016-12-26 MED ORDER — CYCLOBENZAPRINE HCL 10 MG PO TABS: 5.0000 mg | ORAL_TABLET | Freq: Three times a day (TID) | ORAL | Status: DC | PRN

## 2016-12-26 MED ORDER — IBUPROFEN 200 MG PO TABS
400.0000 mg | ORAL_TABLET | ORAL | Status: DC | PRN
  Administered 2016-12-26 (×2): 400 mg via ORAL
  Filled 2016-12-26 (×2): qty 2

## 2016-12-26 MED ORDER — SODIUM CHLORIDE 0.9% FLUSH
3.0000 mL | Freq: Two times a day (BID) | INTRAVENOUS | Status: DC
  Administered 2016-12-26 – 2016-12-27 (×2): 3 mL via INTRAVENOUS

## 2016-12-26 MED ORDER — ZOLPIDEM TARTRATE 5 MG PO TABS
5.0000 mg | ORAL_TABLET | Freq: Every evening | ORAL | Status: DC | PRN
  Administered 2016-12-26: 5 mg via ORAL
  Filled 2016-12-26: qty 1

## 2016-12-26 MED ORDER — ATORVASTATIN CALCIUM 10 MG PO TABS
10.0000 mg | ORAL_TABLET | Freq: Every day | ORAL | Status: DC
  Administered 2016-12-27: 10 mg via ORAL
  Filled 2016-12-26: qty 1

## 2016-12-26 NOTE — ED Notes (Signed)
Dr. Hobbs at bedside  

## 2016-12-26 NOTE — ED Notes (Signed)
Lattie Haw, [place], made aware of troponin.  EDP at bedside at this time.

## 2016-12-26 NOTE — ED Triage Notes (Signed)
Pt arrives POV with c/o syncope x 2. 1st episode after noting pain at right neck and shoulder and  Sat  at side of bed, passed out and  fell to back on bed. Then eased to knees on floor to get back on bed and again passed out landing on back on floor. Pt states hx of left neck and shoulder pain yesterday treated with physical therapy. c/o lights being very bright and c/o sweating and paleness during episode. Pt states pulse 41 10 minutes after episode with bp of 124/67.

## 2016-12-26 NOTE — ED Provider Notes (Signed)
[facility] DEPT Provider Note   CSN: [id number] Arrival date & time: 12/26/16  0831     History   Chief Complaint Chief Complaint  Patient presents with  . Near Syncope    HPI Spencer Underwood is a 76 y.o. male.  The history is provided by the patient and medical records.    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  Patient reports he has been having some trouble with spasms in the right side of his neck. States he did see physical therapy yesterday and had some massage therapy as well which really seem to help with his pain. States he slept well last night but when he tried to get up this morning he had some increased pain in his neck, again along the right side, mildly on the left. States he sat up and got very lightheaded and fell back onto the bed. He did not lose consciousness, eyes remained open per wife who is present. States he laid there for a minute and tried to sit up and get out of bed, however fell to his knees and lost consciousness. Wife denies any significant head injury. States he went to the floor for management but ultimately was able to get up and walk to the bathroom. States once he got there he noticed that the lites appeared very bright and he felt somewhat sweaty and appeared pale.  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  Wife checked his BP which was stable, but HR was low in the 40's.  Patient is very active-- plays golf a few times a week and was previously an avid runner and reports his baseline heart rate is in the 50s. He denies any chest pain or shortness of breath. He denies any focal numbness or weakness. He has no known cardiac history. Does have a family cardiac history-- father and uncle both  had MIs. He is not a smoker. He is on ADT therapy. He has no history of DVT or PE. No history of peripheral vascular disease. He denies any current headache, dizziness, confusion, changes in speech, or blurred vision presently.  Past Medical History:  Diagnosis Date  . Hx of radiation therapy   . Hyperlipidemia   . Personal history of malignant neoplasm of prostate     Patient Active Problem List   Diagnosis Date Noted  . Coronary atherosclerosis of native coronary artery 08/15/2011  . PERSONAL HISTORY MALIGNANT NEOPLASM PROSTATE 07/11/2010    Past Surgical History:  Procedure Laterality Date  . PROSTATECTOMY         Home Medications    Prior to Admission medications   Medication Sig Start Date End Date Taking? Authorizing Provider  aspirin 81 MG tablet Take 81 mg by mouth daily.      Historical Provider, MD  atorvastatin (LIPITOR) 10 MG tablet TAKE 1 TABLET EVERY DAY 09/30/14   Sherren Mocha, MD  calcium-vitamin D (OSCAL WITH D) 500-200 MG-UNIT per tablet Take 1 tablet by mouth daily.      Historical Provider, MD  mesalamine (CANASA) 1000 MG suppository Place 1 suppository (1,000 mg total) rectally at bedtime. 12/18/10 12/18/11  Sable Feil, MD  Misc Natural Products (OSTEO BI-FLEX ADV DOUBLE ST PO) Take by mouth.  Historical Provider, MD  Misc Natural Products (RED WINE EXTRACT PO) Take by mouth.      Historical Provider, MD  STUDY MEDICATION Drug: [place]  [phone] [place] Provider, MD  vitamin E (VITAMIN E) 1000 UNIT capsule Take 1,000 Units by mouth daily.      Historical Provider, MD  zolpidem (AMBIEN) 10 MG tablet Take 10 mg by mouth as needed.  07/19/11   Historical Provider, MD    Family History Family History  Problem Relation Age of Onset  . Hypertension Mother   . Thyroid disease Mother   . Stroke Father   . Hypertension Father   . Coronary artery disease Father     Social History Social History  Substance Use Topics  . Smoking  status: Never Smoker  . Smokeless tobacco: Never Used  . Alcohol use 0.6 oz/week    1 Cans of beer per week     Comment: daily     Allergies   Patient has no known allergies.   Review of Systems Review of Systems  Musculoskeletal: Positive for neck pain.  Neurological: Positive for syncope.  All other systems reviewed and are negative.    Physical Exam Updated Vital Signs BP (!) 143/84   Pulse (!) 50   Temp 97.6 F (36.4 C) (Oral)   Resp 13   Ht 5\' 10"  (1.778 m)   Wt 72.6 kg   SpO2 100%   BMI 22.96 kg/m   Physical Exam  Constitutional: He is oriented to person, place, and time. He appears well-developed and well-nourished.  HENT:  Head: Normocephalic and atraumatic.  Mouth/Throat: Oropharynx is clear and moist.  Eyes: Conjunctivae and EOM are normal. Pupils are equal, round, and reactive to light.  Neck: Normal range of motion.  No JVD, carotid pulses intact, no bruits noted  Cardiovascular: Normal rate, regular rhythm and normal heart sounds.   Pulmonary/Chest: Effort normal and breath sounds normal. No respiratory distress. He has no wheezes.  Abdominal: Soft. Bowel sounds are normal. There is no tenderness. There is no rebound.  Musculoskeletal: Normal range of motion.  Tenderness and apparent spasm along the right cervical paraspinal muscles with extension to right shoulder  Neurological: He is alert and oriented to person, place, and time.  AAOx3, answering questions and following commands appropriately; equal strength UE and LE bilaterally; CN grossly intact; moves all extremities appropriately without ataxia; no focal neuro deficits or facial asymmetry appreciated  Skin: Skin is warm and dry.  Psychiatric: He has a normal mood and affect.  Nursing note and vitals reviewed.    ED Treatments / Results  Labs (all labs ordered are listed, but only abnormal results are displayed) Labs Reviewed  TROPONIN I - Abnormal; Notable for the following:       Result  Value   Troponin I 0.03 (*)    All other components within normal limits  CBC - Abnormal; Notable for the following:    HCT 38.5 (*)    All other components within normal limits  BASIC METABOLIC PANEL  CBC  MAGNESIUM  PHOSPHORUS  URINALYSIS, ROUTINE W REFLEX MICROSCOPIC  CREATININE, SERUM  TROPONIN I  TROPONIN I  TROPONIN I  CBG MONITORING, ED    EKG  EKG Interpretation  Date/Time:  Thursday December 26 2016 08:39:25 EDT Ventricular Rate:  47 PR Interval:  210 QRS Duration: 88 QT Interval:  438 QTC Calculation: 387 R Axis:   64 Text Interpretation:  Sinus bradycardia with  1st degree A-V block Cannot rule out Anterior infarct , age undetermined Abnormal ECG Confirmed by Jeneen Rinks  MD, [place] ([id number]) on 12/26/2016 9:31:08 AM       Radiology Dg Chest 2 View  Result Date: 12/26/2016 CLINICAL DATA:  Syncope, no cardiac history.  Neck spasms. EXAM: CHEST  2 VIEW COMPARISON:  01/28/2007 FINDINGS: The heart size and mediastinal contours are within normal limits. Both lungs are clear. The visualized skeletal structures are unremarkable. IMPRESSION: No active cardiopulmonary disease. Electronically Signed   By: Kathreen Devoid   On: 12/26/2016 09:46    Procedures Procedures (including critical care time)  Medications Ordered in ED Medications - No data to display   Initial Impression / Assessment and Plan / ED Course  I have reviewed the triage vital signs and the nursing notes.  Pertinent labs & imaging results that were available during my care of the patient were reviewed by me and considered in my medical decision making (see chart for details).  76 year old male here with syncope. Reports some right-sided neck pain which she describes as spasm. This does seem reproducible on exam. He is awake, alert, fully oriented. He denies any chest pain or shortness of breath. His heart rate is now in the 50s which he reports is normal for him. He denies any known cardiac history. Has history of  prostate cancer status post prostatectomy, currently in remission on maintenance Lupron. No history of DVT or PE.  EKG without acute ischemic changes. Labwork as above, troponin is mildly elevated at 0.03. Chest x-ray is clear. He remains without any tachycardia or hypoxia to suggest PE.  Given his elevated troponin, I do recommend observation admission for cycling of enzymes. He does report that he has seen Dr. Sherren Mocha with cardiology in the past. Will also consult cardiology.  Patient in agreement with this plan.  Admitted to hospitalist service.  Cardiology consult pending.  Final Clinical Impressions(s) / ED Diagnoses   Final diagnoses:  Syncope, unspecified syncope type  Elevated troponin  Neck pain  History of prostate cancer    New Prescriptions New Prescriptions   No medications on file     Kathryne Hitch 12/26/16 1333    Tanna Furry, MD 01/08/17 [phone]

## 2016-12-26 NOTE — ED Notes (Signed)
Per Lattie Haw, RN, okay to feed pt. Pt given [place] sandwich and a soda.

## 2016-12-26 NOTE — ED Notes (Signed)
States his neck is feeling a little better, warm compress applied to neck.

## 2016-12-26 NOTE — H&P (Signed)
History and Physical    Dodd Schmid GMW:[id number] DOB: 04/10/1941 DOA: 12/26/2016  PCP: Jerlyn Ly, MD  Patient coming from: Home  Chief Complaint: near syncope  HPI: Spencer Underwood is a 76 y.o. male, retired urologist with medical history significant of Prostate CA treated in 2008 with prostatectomy, followed by radiation therapy and currently on Lupron injections, hyperlipidemia who presented to the ED [facility] with complaints of syncope.  Patient is very physically active and has been a runner and golf player all his life. He lately started having issues with right sided neck pain and now goes through PT sessions. The last session was yesterday and he felt well after it. The patient  reported having an uneventful last night, however, this morning when he attempted to get up from bed, he started having severe right  sided neck pain and lightheadedness forcing him to lay back to bed. He did not lose his consciousness, but was very weak.  After few minutes he was trying to get up from bed by turning on the stomach and attempting to slide to his knees in order to be able to support himself with the arms when trying to stand up, but fell on his back. At that time he became unconscious. His wife who witnessed the event and did not report any significant head injury or jerking. After a brief period of unconsciousness patient came around and managed to go to the bathroom where the light was too bright for his eyes, he felt really diaphoretic and noticed that his skin was very pale.  ED Course: on arrival patient was noted to be bradycardic, with HR 46-52, otherwise VS were stable EKG showed normal sinus rhythm without ischemic changes Blood work was unremarkable except mildly abnormal troponin 0.03 Chest XRay without acute findings  Review of Systems: As per HPI otherwise 10 point review of systems negative.   Ambulatory Status: Independent  Past Medical History:  Diagnosis Date  .  Hx of radiation therapy   . Hyperlipidemia   . Personal history of malignant neoplasm of prostate     Past Surgical History:  Procedure Laterality Date  . PROSTATECTOMY      Social History   Social History  . Marital status: Married    Spouse name: N/A  . Number of children: N/A  . Years of education: N/A   Occupational History  . Not on file.   Social History Main Topics  . Smoking status: Never Smoker  . Smokeless tobacco: Never Used  . Alcohol use 0.6 oz/week    1 Cans of beer per week     Comment: daily  . Drug use: No  . Sexual activity: Not on file   Other Topics Concern  . Not on file   Social History Narrative  . No narrative on file    No Known Allergies  Family History  Problem Relation Age of Onset  . Hypertension Mother   . Thyroid disease Mother   . Stroke Father   . Hypertension Father   . Coronary artery disease Father     Prior to Admission medications   Medication Sig Start Date End Date Taking? Authorizing Provider  aspirin 81 MG tablet Take 81 mg by mouth daily.      Historical Provider, MD  atorvastatin (LIPITOR) 10 MG tablet TAKE 1 TABLET EVERY DAY 09/30/14   Sherren Mocha, MD  calcium-vitamin D (OSCAL WITH D) 500-200 MG-UNIT per tablet Take 1 tablet by mouth daily.  Historical Provider, MD  mesalamine (CANASA) 1000 MG suppository Place 1 suppository (1,000 mg total) rectally at bedtime. 12/18/10 12/18/11  Sable Feil, MD  Misc Natural Products (OSTEO BI-FLEX ADV DOUBLE ST PO) Take by mouth.      Historical Provider, MD  Misc Natural Products (RED WINE EXTRACT PO) Take by mouth.      Historical Provider, MD  STUDY MEDICATION Drug: [place]  [phone] [place] Provider, MD  vitamin E (VITAMIN E) 1000 UNIT capsule Take 1,000 Units by mouth daily.      Historical Provider, MD  zolpidem (AMBIEN) 10 MG tablet Take 10 mg by mouth as needed.  07/19/11   Historical Provider, MD    Physical Exam: Vitals:   12/26/16 0915  12/26/16 1000 12/26/16 1030 12/26/16 1045  BP: (!) 142/82 134/86 128/83 (!) 127/98  Pulse: (!) 52 (!) 46 (!) 51 (!) 51  Resp: 15 17 14 12   Temp:      TempSrc:      SpO2: 100% 100% 100% 98%  Weight:      Height:         General: Appears calm and comfortable Eyes: PERRLA, EOMI, normal lids, iris ENT:  grossly normal hearing, lips & tongue, mucous membranes moist and intact Neck: no lymphoadenopathy, masses or thyromegaly, muscle rigidity on both sides of the neck noted with limited range of motion Cardiovascular: RRR, no m/r/g. No JVD, carotid bruits. No LE edema.  Respiratory: bilateral no wheezes, rales, rhonchi or cracles. Normal respiratory effort. No accessory muscle use observed Abdomen: soft, non-tender, non-distended, no organomegaly or masses appreciated. BS present in all quadrants Skin: no rash, ulcers or induration seen on limited exam Musculoskeletal: grossly normal tone BUE/BLE, good ROM, no bony abnormality or joint deformities observed Psychiatric: grossly normal mood and affect, speech fluent and appropriate, alert and oriented x3 Neurologic: CN II-XII grossly intact, moves all extremities in coordinated fashion, sensation intact  Labs on Admission: I have personally reviewed following labs and imaging studies  CBC, BMP  GFR: Estimated Creatinine Clearance: 71.6 mL/min (by C-G formula based on SCr of 0.93 mg/dL).   Creatinine Clearance: Estimated Creatinine Clearance: 71.6 mL/min (by C-G formula based on SCr of 0.93 mg/dL).   Radiological Exams on Admission: Dg Chest 2 View  Result Date: 12/26/2016 CLINICAL DATA:  Syncope, no cardiac history.  Neck spasms. EXAM: CHEST  2 VIEW COMPARISON:  01/28/2007 FINDINGS: The heart size and mediastinal contours are within normal limits. Both lungs are clear. The visualized skeletal structures are unremarkable. IMPRESSION: No active cardiopulmonary disease. Electronically Signed   By: Kathreen Devoid   On: 12/26/2016 09:46     EKG: Independently reviewed - sinus bradycardia without ischemic changes  Assessment/Plan Principal Problem:   Syncope Active Problems:   PERSONAL HISTORY MALIGNANT NEOPLASM PROSTATE   Hyperlipidemia   Family history of early CAD   Syncope - etiology unclear, could be associated with profound bradycardia and severe pain vs. TIA vs structural abnormality Will check orthostatic vital signs Obtain brain CT w/o contrast and carotid ultrasound, Echo, electrolytes - Magnesium and phosphorus  Hyperlipidemia - continue statin therapy  Abnormal troponin - possibly related to brief episode of hypoperfusion He has been seen by Dr. Burt Knack in the past and had normal stress test Given his strong family history of CAD, cardiology consult was requested by EDP   DVT prophylaxis: Lovenox Code Status: full Family Communication: none Disposition Plan: cardiac tele Consults called: cardiology by [facility] Admission  status: observation   York Grice, [place] Pager: [phone] [facility]  If 7PM-7AM, please contact night-coverage [email or web address] Password [facility]  12/26/2016, 12:22 PM

## 2016-12-26 NOTE — Consult Note (Signed)
Cardiology Consult    Patient ID: Draven Natter MRN: [id number], DOB/AGE: 1940-09-15   Admit date: 12/26/2016 Date of Consult: 12/26/2016  Primary Physician: Jerlyn Ly, MD Primary Cardiologist: Jerilynn Mages. Burt Knack, MD  08/2011 Requesting Provider: Dub Amis, PA-C  Patient Profile    Spencer Underwood is a 76 y.o. male with a history of prostate cancer, hyperlipidemia, sinus bradycardia, and previously normal echo and stress test, who is being seen today for the evaluation of syncope at the request of M. Gerald Dexter, PA-C.  Past Medical History   Past Medical History:  Diagnosis Date  . Coronary atherosclerosis    a. 2012 - incidentally noted on CT scan of chest;  b. 08/2011 Myoview: normal; c. 03/2015 CPX @ [facility]: normal w/ PAC's, PVC's, and a 3 beat run of NSVT in recovery.  Marland Kitchen History of echocardiogram    a. 12/2004 Echo: Nl EF, trace TR/MR.  Marland Kitchen Hx of radiation therapy   . Hyperlipidemia   . Personal history of malignant neoplasm of prostate    a. 2008 s/p prostatecomtomy-->radiation-->currently on Lupron injections.  . Sinus bradycardia   . Syncope    a. 12/2016    Past Surgical History:  Procedure Laterality Date  . PROSTATECTOMY    . ROBOT ASSISTED LAPAROSCOPIC RADICAL PROSTATECTOMY       Allergies  No Known Allergies  History of Present Illness    76 y/o ? with a h/o prostate cancer s/p prostatectomy in 2008 with subsequent radiation and ongoing lupron injections.  He is followed closely @ [facility].  He has previously been evaluated by cardiology in the setting of Systolic murmur, with echocardiogram in 2006 showing only trace TR and MR with normal LV function. 2012, he was evaluated after an incidental finding of coronary atherosclerosis on CT of the chest. He was asymptomatic at that time. Stress testing was undertaken and was normal. He has not been seen by our practice since.  He underwent CPX testing at [facility] in July of 2, which was read out as normal. He has since done  well. He continues to exercise most days a week, either using an elliptical or playing golf. He generally walks the course and has no issues.  He was in his usual state of health until Tuesday, April 24, when he began to experience intermittent left-sided neck pain, that was worsened with rotation of his head to the right. Pain was sharp and fleeting in nature. On April 25, he noted right-sided neck pain. He was seen by physical therapy and after therapy, he had improvement in bilateral neck pain. This morning, upon awakening, he moved to sit up from the bed but immediately noted severe right neck pain. Upon becoming upright, felt lightheaded and was unable to stand and he fell back into the bed. He says now that he does not necessarily remember falling back into the bed. His wife reported to him that his eyes were open during that time. At that point, he rolled over onto his abdomen and slid his knees onto the floor. He then fell backwards onto his back. He does not recall how he got on the floor. He says he is pretty sure he lost consciousness after falling onto his back as he was shocked to hear his wife on the phone with 911 when he regained consciousness. Because he regained consciousness, they did not ask EMS to come out to the house. His wife attended to him and he was able to stand up. When he went  in the bathroom, he felt lightheaded and clammy. He was not having any chest pain, dyspnea, nausea, or vomiting. He tested himself or stroke symptoms did not have any. His wife checked his blood pressure and he had a systolic in the 213Y. Heart rate was 41. Due to bradycardia, he decided to seek help. He was initially going to present to his primary care provider but opted to come to the emergency room instead. Here, he was found to be in sinus bradycardia with a heart rate of 47 and a first-degree AV block with a PR interval of 210 ms. No other high-grade block noted. Head CT is without fractures but was  notable for cervical foraminal narrowing (Mild RIGHT C2-3, mild RIGHT C4-5, moderate RIGHT C5-6 and mild bilateral C6-7 neural foraminal narrowing). He is currently symptomatically and hemodynamically stable. No events on telemetry since arrival.  Home Medications    Prior to Admission medications   Medication Sig Start Date End Date Taking? Authorizing Provider  aspirin 81 MG tablet Take 81 mg by mouth daily.     Yes Historical Provider, MD  atorvastatin (LIPITOR) 10 MG tablet TAKE 1 TABLET EVERY DAY 09/30/14  Yes Sherren Mocha, MD  B Complex Vitamins (VITAMIN B COMPLEX PO) Take 1 tablet by mouth daily.   Yes Historical Provider, MD  cholecalciferol (VITAMIN D) 1000 units tablet Take 1,000 Units by mouth daily.   Yes Historical Provider, MD  leuprolide (LUPRON) 30 MG injection Inject 30 mg into the muscle every 4 (four) months.   Yes Historical Provider, MD  Misc Natural Products (OSTEO BI-FLEX ADV DOUBLE ST PO) Take 1 tablet by mouth daily.    Yes Historical Provider, MD  zolpidem (AMBIEN) 10 MG tablet Take 10 mg by mouth as needed.  07/19/11  Yes Historical Provider, MD  mesalamine (CANASA) 1000 MG suppository Place 1 suppository (1,000 mg total) rectally at bedtime. Patient not taking: Reported on 12/26/2016 12/18/10 12/18/11  Sable Feil, MD     Family History    Family History  Problem Relation Age of Onset  . Hypertension Mother   . Thyroid disease Mother   . Stroke Father   . Hypertension Father   . Coronary artery disease Father     Social History    Social History   Social History  . Marital status: Married    Spouse name: N/A  . Number of children: N/A  . Years of education: N/A   Occupational History  . Retired Engineer, drilling    Social History Main Topics  . Smoking status: Never Smoker  . Smokeless tobacco: Never Used  . Alcohol use 0.6 oz/week    1 Cans of beer per week     Comment: daily  . Drug use: No  . Sexual activity: Not on file   Other Topics  Concern  . Not on file   Social History Narrative   Lives in [place] with wife.  Exercises regularly - elliptical, play golf.     Review of Systems    General:  Felt clammy following syncope.  No chills, fever, night sweats or weight changes.  Cardiovascular:  +++ presyncope and syncope as outlined above.  No chest pain, dyspnea on exertion, edema, orthopnea, palpitations, paroxysmal nocturnal dyspnea. Dermatological: No rash, lesions/masses Respiratory: No cough, dyspnea Urologic: No hematuria, dysuria Abdominal:   No nausea, vomiting, diarrhea, bright red blood per rectum, melena, or hematemesis Neurologic:  No visual changes, wkns, changes in mental status. All other systems reviewed and  are otherwise negative except as noted above.  Physical Exam    Blood pressure 131/84, pulse (!) 50, temperature 97.6 F (36.4 C), temperature source Oral, resp. rate 17, height 5\' 10"  (1.778 m), weight 160 lb (72.6 kg), SpO2 100 %.  General: Pleasant, NAD Psych: Normal affect. Neuro: Alert and oriented X 3. Moves all extremities spontaneously. HEENT: Normal  Neck: Supple without bruits or JVD. Lungs:  Resp regular and unlabored, CTA. Heart: RRR no s3, s4, 1/6 syst murmur LLSB  apex. Abdomen: Soft, non-tender, non-distended, BS + x 4.  Extremities: No clubbing, cyanosis or edema. DP/PT/Radials 2+ and equal bilaterally.  Labs    Recent Labs  12/26/16 0945 12/26/16 1254  TROPONINI 0.03* <0.03   Lab Results  Component Value Date   WBC 6.6 12/26/2016   HGB 13.1 12/26/2016   HCT 38.5 (L) 12/26/2016   MCV 90.8 12/26/2016   PLT 182 12/26/2016     Recent Labs Lab 12/26/16 0911 12/26/16 1254  NA 138  --   K 4.0  --   CL 105  --   CO2 26  --   BUN 20  --   CREATININE 0.93 0.97  CALCIUM 9.3  --   GLUCOSE 97  --     Radiology Studies    Dg Chest 2 View  Result Date: 12/26/2016 CLINICAL DATA:  Syncope, no cardiac history.  Neck spasms. EXAM: CHEST  2 VIEW COMPARISON:  01/28/2007  FINDINGS: The heart size and mediastinal contours are within normal limits. Both lungs are clear. The visualized skeletal structures are unremarkable. IMPRESSION: No active cardiopulmonary disease. Electronically Signed   By: Kathreen Devoid   On: 12/26/2016 09:46   Ct Head and Cervical Spine Wo Contrast  Result Date: 12/26/2016 CLINICAL DATA:  Woke up with RIGHT neck spasms, presyncope. History of prostate cancer, hyperlipidemia and syncope. EXAM: CT HEAD WITHOUT CONTRAST CT CERVICAL SPINE WITHOUT CONTRAST TECHNIQUE: Multidetector CT imaging of the head and cervical spine was performed following the standard protocol without intravenous contrast. Multiplanar CT image reconstructions of the cervical spine were also generated. COMPARISON:  Bone scan Jan 08, 2007 FINDINGS: CT HEAD FINDINGS BRAIN: No intraparenchymal hemorrhage, mass effect nor midline shift. The ventricles and sulci are normal for age. Patchy supratentorial white matter hypodensities less than expected for patient's age, though non-specific are most compatible with chronic small vessel ischemic disease. No acute large vascular territory infarcts. No abnormal extra-axial fluid collections. Basal cisterns are patent. VASCULAR: Mild calcific atherosclerosis of the carotid siphons. SKULL: No skull fracture. No significant scalp soft tissue swelling. SINUSES/ORBITS: Mild paranasal sinus mucosal thickening without air-fluid levels. Mastoid air cells are well aerated.The included ocular globes and orbital contents are non-suspicious. Status post LEFT ocular lens implant. OTHER: None. CT CERVICAL SPINE FINDINGS ALIGNMENT: Maintained lordosis. Vertebral bodies in alignment. Mild broad levoscoliosis. SKULL BASE AND VERTEBRAE: Cervical vertebral bodies and posterior elements are intact. Moderate to severe C5-6 and moderate C6-7 disc height loss with endplate spurring and uncovertebral hypertrophy compatible with degenerative discs. Moderate LEFT C4-5 facet  arthropathy. No destructive bony lesions. C1-2 articulation maintained with mild arthropathy. SOFT TISSUES AND SPINAL CANAL: Normal. DISC LEVELS: No significant osseous canal stenosis. Mild RIGHT C2-3, mild RIGHT C4-5, moderate RIGHT C5-6 and mild bilateral C6-7 neural foraminal narrowing. UPPER CHEST: Lung apices are clear. OTHER: None. IMPRESSION: CT HEAD: Normal CT HEAD for age. CT CERVICAL SPINE: No fracture mild 1 minutes. Predominately RIGHT-sided neural foraminal narrowing, moderate at C5-6. Electronically Signed   By:  Elon Alas M.D.   On: 12/26/2016 14:08    ECG & Cardiac Imaging    Sinus brady, 47, 1st deg avb, delayed R progression - similar to 08/2011 ecg though 1st deg avb is new.  Assessment & Plan    1.  Syncope:  Patient presented to the emergency department early this morning after experiencing lightheadedness followed by syncope in the setting of severe right-sided neck pain. This was followed by feeling cool and clammy. He was found to be bradycardic on admission with a first-degree AV block and PR interval of 210 ms. He does have a history of sinus bradycardia and this was noted on his ECG and December 2012 as well. Heart rates in the ER have been in the high 40s to 50s and has been hemodynamically stable. He is being admitted to the medicine service. CT of the head was normal. Carotid ultrasound is pending. Echocardiogram is also ordered. Check orthostatics vs. Provided that echo was normal and there are no significant events on telemetry, we will plan on outpatient event monitoring.  2.  Elevated troponin: Upon presentation, troponin was minimally elevated at 0.03. Follow-up troponin is normal at less than 0.03. He has no recent history of chest pain is very active without any recent change in exercise tolerance. He had a normal stress test in 2012 and underwent cardiopulmonary exercise testing in 2016 at [facility], which was also read out as normal. As above, follow-up  echocardiogram. If EF is normal and troponins also remain normal, would not pursue further ischemic evaluation.  3. Bilateral Neck Pain:  Initially developed L neck pain on 4/24 followed by R neck pain on 4/25, which worsened this morning.  CT neck notable for Mild RIGHT C2-3, mild RIGHT C4-5, moderate RIGHT C5-6 and mild bilateral C6-7 neural foraminal narrowing.  Outpt f/u.  4.  Prostate cancer: This is followed closely at [facility].  He is on q4 mos Lupron injections.  5.  HL:  Cont statin therapy - followed by [name].  Signed, Murray Hodgkins, NP 12/26/2016, 3:15 PM  History and all data above reviewed.  Patient examined.  I agree with the findings as above.  The patient had a syncopal episode as described above.  He had sudden neck pain and then frank syncope.  No orthostatic symptoms.  Aside from coronary calcium he has had no past cardiac history.  The patient denies any new symptoms such as chest discomfort, neck or arm discomfort. There has been no new shortness of breath, PND or orthopnea. There have been no reported palpitations, presyncope or syncope.  He is very active. The patient exam reveals COR:RRR, no rub  ,  Lungs: clear  ,  Abd: Positive bowel sounds, no rebound no guarding, Ext No edema  .  All available labs, radiology testing, previous records reviewed. Agree with documented assessment and plan. Syncope:  Probably vagal.  No evidence conduction disease.  Orthostatic BPs have been checked.  Other recent work up negative.  He can be observed overnight. Home event monitor will be arranged.   Elevated troponin:  Non specific  Minus Breeding  3:48 PM  12/26/2016

## 2016-12-27 ENCOUNTER — Observation Stay (HOSPITAL_BASED_OUTPATIENT_CLINIC_OR_DEPARTMENT_OTHER): Payer: Medicare Other

## 2016-12-27 ENCOUNTER — Other Ambulatory Visit: Payer: Self-pay | Admitting: Cardiology

## 2016-12-27 DIAGNOSIS — R748 Abnormal levels of other serum enzymes: Secondary | ICD-10-CM

## 2016-12-27 DIAGNOSIS — R778 Other specified abnormalities of plasma proteins: Secondary | ICD-10-CM

## 2016-12-27 DIAGNOSIS — R55 Syncope and collapse: Secondary | ICD-10-CM

## 2016-12-27 DIAGNOSIS — R7989 Other specified abnormal findings of blood chemistry: Secondary | ICD-10-CM

## 2016-12-27 LAB — TROPONIN I: Troponin I: <0.03 ng/mL (ref <0.03)

## 2016-12-27 LAB — COMPREHENSIVE METABOLIC PANEL
ALK PHOS: 42 U/L (ref 38–126)
ALT: 16 U/L — ABNORMAL LOW (ref 17–63)
ANION GAP: 8 (ref 5–15)
AST: 23 U/L (ref 15–41)
Albumin: 3.5 g/dL (ref 3.5–5.0)
BILIRUBIN TOTAL: 0.5 mg/dL (ref 0.3–1.2)
BUN: 13 mg/dL (ref 6–20)
CALCIUM: 9.1 mg/dL (ref 8.9–10.3)
CO2: 26 mmol/L (ref 22–32)
Chloride: 101 mmol/L (ref 101–111)
Creatinine, Ser: 0.96 mg/dL (ref 0.61–1.24)
GFR calc Af Amer: >60 mL/min (ref >60)
Glucose, Bld: 103 mg/dL — ABNORMAL HIGH (ref 65–99)
POTASSIUM: 3.7 mmol/L (ref 3.5–5.1)
Sodium: 135 mmol/L (ref 135–145)
TOTAL PROTEIN: 5.7 g/dL — AB (ref 6.5–8.1)

## 2016-12-27 LAB — VAS US CAROTID
LCCAPDIAS: 29 cm/s
LCCAPSYS: 106 cm/s
LEFT ECA DIAS: -20 cm/s
LEFT VERTEBRAL DIAS: 17 cm/s
Left ICA dist dias: -22 cm/s
Left ICA dist sys: -71 cm/s
Left ICA prox dias: -11 cm/s
Left ICA prox sys: -75 cm/s
RCCADSYS: -85 cm/s
RCCAPDIAS: 19 cm/s
RCCAPSYS: 96 cm/s
RIGHT ECA DIAS: -12 cm/s
RIGHT VERTEBRAL DIAS: 10 cm/s

## 2016-12-27 LAB — CBC
HEMATOCRIT: 35.9 % — AB (ref 39.0–52.0)
Hemoglobin: 12.1 g/dL — ABNORMAL LOW (ref 13.0–17.0)
MCH: 30.3 pg (ref 26.0–34.0)
MCHC: 33.7 g/dL (ref 30.0–36.0)
MCV: 90 fL (ref 78.0–100.0)
Platelets: 176 10*3/uL (ref 150–400)
RBC: 3.99 MIL/uL — ABNORMAL LOW (ref 4.22–5.81)
RDW: 13.1 % (ref 11.5–15.5)
WBC: 4.9 10*3/uL (ref 4.0–10.5)

## 2016-12-27 LAB — GLUCOSE, CAPILLARY: Glucose-Capillary: 98 mg/dL (ref 65–99)

## 2016-12-27 LAB — ECHOCARDIOGRAM COMPLETE
HEIGHTINCHES: 70 in
Weight: 2600 oz

## 2016-12-27 MED ORDER — IBUPROFEN 400 MG PO TABS: 400.0000 mg | ORAL_TABLET | ORAL | 0 refills | Status: DC | PRN

## 2016-12-27 MED ORDER — CYCLOBENZAPRINE HCL 5 MG PO TABS: 5.0000 mg | ORAL_TABLET | Freq: Three times a day (TID) | ORAL | 0 refills | Status: DC | PRN

## 2016-12-27 NOTE — Discharge Summary (Signed)
Spencer Underwood, is a 76 y.o. male  DOB 11/20/1940  MRN [id number].  Admission date:  12/26/2016  Admitting Physician  Elwin Mocha, MD  Discharge Date:  12/27/2016   Primary MD  Jerlyn Ly, MD  Recommendations for primary care physician for things to follow:   Syncope - etiology unclear, ?vasovagal CT brain 4/26=> no acute process CXR 4/26  => no active cardiopulmonary disease Carotid ultrasound => negative for significant stenosis, prel  and cardiac echo EF wnl Continue telemetry Please f/u with cardiology Dr. Burt Knack in 1 week  Hyperlipidemia - continue statin therapy  Abnormal troponin - possibly related to brief episode of hypoperfusion He has been seen by Dr. Burt Knack in the past and had normal stress test Cardiology thinks that this is nonspecific.    Admission Diagnosis  Neck pain [M54.2] Syncope [R55] Elevated troponin [R74.8] History of prostate cancer [Z85.46] Syncope, unspecified syncope type [R55]   Discharge Diagnosis  Neck pain [M54.2] Syncope [R55] Elevated troponin [R74.8] History of prostate cancer [Z85.46] Syncope, unspecified syncope type [R55]      Principal Problem:   Syncope Active Problems:   PERSONAL HISTORY MALIGNANT NEOPLASM PROSTATE   Hyperlipidemia   Family history of early CAD      Past Medical History:  Diagnosis Date  . Coronary atherosclerosis    a. 2012 - incidentally noted on CT scan of chest;  b. 08/2011 Myoview: normal; c. 03/2015 CPX @ [facility]: normal w/ PAC's, PVC's, and a 3 beat run of NSVT in recovery.  Spencer Underwood History of echocardiogram    a. 12/2004 Echo: Nl EF, trace TR/MR.  Spencer Underwood Hx of radiation therapy   . Hyperlipidemia   . Personal history of malignant neoplasm of prostate    a. 2008 s/p prostatecomtomy-->radiation-->currently on Lupron injections.  . Sinus bradycardia   . Syncope    a. 12/2016    Past Surgical History:  Procedure  Laterality Date  . PROSTATECTOMY    . ROBOT ASSISTED LAPAROSCOPIC RADICAL PROSTATECTOMY         HPI  from the history and physical done on the day of admission:     76 y.o. male, retired Dealer with medical history significant of Prostate CA treated in 2008 with prostatectomy, followed by radiation therapy and currently on Lupron injections, hyperlipidemia who presented to the ED [facility] with complaints of syncope.  Patient is very physically active and has been a runner and golf player all his life. He lately started having issues with right sided neck pain and now goes through PT sessions. The last session was yesterday and he felt well after it. The patient  reported having an uneventful last night, however, this morning when he attempted to get up from bed, he started having severe right  sided neck pain and lightheadedness forcing him to lay back to bed. He did not lose his consciousness, but was very weak.  After few minutes he was trying to get up from bed by turning on the stomach and attempting  to slide to his knees in order to be able to support himself with the arms when trying to stand up, but fell on his back. At that time he became unconscious. His wife who witnessed the event and did not report any significant head injury or jerking. After a brief period of unconsciousness patient came around and managed to go to the bathroom where the light was too bright for his eyes, he felt really diaphoretic and noticed that his skin was very pale.  ED Course: on arrival patient was noted to be bradycardic, with HR 46-52, otherwise VS were stable EKG showed normal sinus rhythm without ischemic changes Blood work was unremarkable except mildly abnormal troponin 0.03 Chest XRay without acute findings    Hospital Course:     Pt was admitted to hospital CT brain 4/26=> negative for acute process CT c spine 4/26=>RIGHT-sided neural foraminal narrowing, moderate at 5-6.  Pt had no further  syncope.  Pt notes that he is bradycardic at baseline.  Cardiology was consulted and thought that his syncope was secondary to vasovagal episode.   Orthostatic bp wnl.   Pt denies cp, palp, sob, n/v, diarrhea, focal neurological symptoms.  Cardiac echo 4/26=> Carotid ultrasound pending this am. prelimiary result negative.    Pt feels stable and would like to be discharged home today.     Follow UP  Follow-up Information    PERINI,MARK A, MD Follow up in 2 week(s).   Specialty:  Internal Medicine Contact information: [place] [address] [phone]        Sherren Mocha, MD Follow up in 1 week(s).   Specialty:  Cardiology Contact information: [address] [place] [address] [phone]            Consults obtained - cardiology  Discharge Condition:  stable  Diet and Activity recommendation: See Discharge Instructions below  Discharge Instructions         Discharge Medications     Allergies as of 12/27/2016   No Known Allergies     Medication List    TAKE these medications   aspirin 81 MG tablet Take 81 mg by mouth daily.   atorvastatin 10 MG tablet Commonly known as:  LIPITOR TAKE 1 TABLET EVERY DAY   cholecalciferol 1000 units tablet Commonly known as:  VITAMIN D Take 1,000 Units by mouth daily.   cyclobenzaprine 5 MG tablet Commonly known as:  FLEXERIL Take 1 tablet (5 mg total) by mouth 3 (three) times daily as needed for muscle spasms.   ibuprofen 400 MG tablet Commonly known as:  ADVIL,MOTRIN Take 1 tablet (400 mg total) by mouth every 4 (four) hours as needed for mild pain or moderate pain.   leuprolide 30 MG injection Commonly known as:  LUPRON Inject 30 mg into the muscle every 4 (four) months.   mesalamine 1000 MG suppository Commonly known as:  CANASA Place 1 suppository (1,000 mg total) rectally at bedtime.   OSTEO BI-FLEX ADV DOUBLE ST PO Take 1 tablet by mouth daily.   VITAMIN B  COMPLEX PO Take 1 tablet by mouth daily.   zolpidem 10 MG tablet Commonly known as:  AMBIEN Take 10 mg by mouth as needed.       Major procedures and Radiology Reports - PLEASE review detailed and final reports for all details, in brief -      Dg Chest 2 View  Result Date: 12/26/2016 CLINICAL DATA:  Syncope, no cardiac history.  Neck  spasms. EXAM: CHEST  2 VIEW COMPARISON:  01/28/2007 FINDINGS: The heart size and mediastinal contours are within normal limits. Both lungs are clear. The visualized skeletal structures are unremarkable. IMPRESSION: No active cardiopulmonary disease. Electronically Signed   By: Kathreen Devoid   On: 12/26/2016 09:46   Ct Head Wo Contrast  Result Date: 12/26/2016 CLINICAL DATA:  Woke up with RIGHT neck spasms, presyncope. History of prostate cancer, hyperlipidemia and syncope. EXAM: CT HEAD WITHOUT CONTRAST CT CERVICAL SPINE WITHOUT CONTRAST TECHNIQUE: Multidetector CT imaging of the head and cervical spine was performed following the standard protocol without intravenous contrast. Multiplanar CT image reconstructions of the cervical spine were also generated. COMPARISON:  Bone scan Jan 08, 2007 FINDINGS: CT HEAD FINDINGS BRAIN: No intraparenchymal hemorrhage, mass effect nor midline shift. The ventricles and sulci are normal for age. Patchy supratentorial white matter hypodensities less than expected for patient's age, though non-specific are most compatible with chronic small vessel ischemic disease. No acute large vascular territory infarcts. No abnormal extra-axial fluid collections. Basal cisterns are patent. VASCULAR: Mild calcific atherosclerosis of the carotid siphons. SKULL: No skull fracture. No significant scalp soft tissue swelling. SINUSES/ORBITS: Mild paranasal sinus mucosal thickening without air-fluid levels. Mastoid air cells are well aerated.The included ocular globes and orbital contents are non-suspicious. Status post LEFT ocular lens implant. OTHER:  None. CT CERVICAL SPINE FINDINGS ALIGNMENT: Maintained lordosis. Vertebral bodies in alignment. Mild broad levoscoliosis. SKULL BASE AND VERTEBRAE: Cervical vertebral bodies and posterior elements are intact. Moderate to severe C5-6 and moderate C6-7 disc height loss with endplate spurring and uncovertebral hypertrophy compatible with degenerative discs. Moderate LEFT C4-5 facet arthropathy. No destructive bony lesions. C1-2 articulation maintained with mild arthropathy. SOFT TISSUES AND SPINAL CANAL: Normal. DISC LEVELS: No significant osseous canal stenosis. Mild RIGHT C2-3, mild RIGHT C4-5, moderate RIGHT C5-6 and mild bilateral C6-7 neural foraminal narrowing. UPPER CHEST: Lung apices are clear. OTHER: None. IMPRESSION: CT HEAD: Normal CT HEAD for age. CT CERVICAL SPINE: No fracture mild 1 minutes. Predominately RIGHT-sided neural foraminal narrowing, moderate at C5-6. Electronically Signed   By: Elon Alas M.D.   On: 12/26/2016 14:08   Ct Cervical Spine Wo Contrast  Result Date: 12/26/2016 CLINICAL DATA:  Woke up with RIGHT neck spasms, presyncope. History of prostate cancer, hyperlipidemia and syncope. EXAM: CT HEAD WITHOUT CONTRAST CT CERVICAL SPINE WITHOUT CONTRAST TECHNIQUE: Multidetector CT imaging of the head and cervical spine was performed following the standard protocol without intravenous contrast. Multiplanar CT image reconstructions of the cervical spine were also generated. COMPARISON:  Bone scan Jan 08, 2007 FINDINGS: CT HEAD FINDINGS BRAIN: No intraparenchymal hemorrhage, mass effect nor midline shift. The ventricles and sulci are normal for age. Patchy supratentorial white matter hypodensities less than expected for patient's age, though non-specific are most compatible with chronic small vessel ischemic disease. No acute large vascular territory infarcts. No abnormal extra-axial fluid collections. Basal cisterns are patent. VASCULAR: Mild calcific atherosclerosis of the carotid  siphons. SKULL: No skull fracture. No significant scalp soft tissue swelling. SINUSES/ORBITS: Mild paranasal sinus mucosal thickening without air-fluid levels. Mastoid air cells are well aerated.The included ocular globes and orbital contents are non-suspicious. Status post LEFT ocular lens implant. OTHER: None. CT CERVICAL SPINE FINDINGS ALIGNMENT: Maintained lordosis. Vertebral bodies in alignment. Mild broad levoscoliosis. SKULL BASE AND VERTEBRAE: Cervical vertebral bodies and posterior elements are intact. Moderate to severe C5-6 and moderate C6-7 disc height loss with endplate spurring and uncovertebral hypertrophy compatible with degenerative discs. Moderate LEFT C4-5 facet arthropathy.  No destructive bony lesions. C1-2 articulation maintained with mild arthropathy. SOFT TISSUES AND SPINAL CANAL: Normal. DISC LEVELS: No significant osseous canal stenosis. Mild RIGHT C2-3, mild RIGHT C4-5, moderate RIGHT C5-6 and mild bilateral C6-7 neural foraminal narrowing. UPPER CHEST: Lung apices are clear. OTHER: None. IMPRESSION: CT HEAD: Normal CT HEAD for age. CT CERVICAL SPINE: No fracture mild 1 minutes. Predominately RIGHT-sided neural foraminal narrowing, moderate at C5-6. Electronically Signed   By: Elon Alas M.D.   On: 12/26/2016 14:08    Micro Results     No results found for this or any previous visit (from the past 240 hour(s)).     Today   Subjective    Spencer Underwood today has had no further syncope.   no headache,no chest pain, no sob, no abdominal pain,no new weakness tingling or numbness, feels much better wants to go home today.    Objective   Blood pressure (!) 109/56, pulse (!) 45, temperature 97.4 F (36.3 C), temperature source Oral, resp. rate 14, height 5\' 10"  (1.778 m), weight 73.7 kg (162 lb 8 oz), SpO2 99 %.   Intake/Output Summary (Last 24 hours) at 12/27/16 0759 Last data filed at 12/26/16 2100  Gross per 24 hour  Intake              480 ml  Output                 0 ml  Net              480 ml    Exam Awake Alert, Oriented x 3, No new F.N deficits, Normal affect [place].AT,PERRAL Supple Neck,No JVD, No cervical lymphadenopathy appriciated.  Symmetrical Chest wall movement, Good air movement bilaterally, CTAB Bradycardic, s1, s2, no m/g/r +ve B.Sounds, Abd Soft, Non tender, No organomegaly appriciated, No rebound -guarding or rigidity. No Cyanosis, Clubbing or edema, No new Rash or bruise No pronator drift.  CN2-12 intact, reflexes 2+ symmetric, diffuse with no clonus Motor 5/5 in all 4 ext    Data Review   CBC w Diff: Lab Results  Component Value Date   WBC 4.9 12/27/2016   HGB 12.1 (L) 12/27/2016   HCT 35.9 (L) 12/27/2016   PLT 176 12/27/2016   LYMPHOPCT 22.1 07/11/2010   MONOPCT 7.6 07/11/2010   EOSPCT 2.1 07/11/2010   BASOPCT 1.2 07/11/2010    CMP: Lab Results  Component Value Date   NA 135 12/27/2016   K 3.7 12/27/2016   CL 101 12/27/2016   CO2 26 12/27/2016   BUN 13 12/27/2016   CREATININE 0.96 12/27/2016   PROT 5.7 (L) 12/27/2016   ALBUMIN 3.5 12/27/2016   BILITOT 0.5 12/27/2016   ALKPHOS 42 12/27/2016   AST 23 12/27/2016   ALT 16 (L) 12/27/2016  .   Total Time in preparing paper work, data evaluation and todays exam - 2 minutes  Jani Gravel M.D on 12/27/2016 at [place]  [phone]

## 2016-12-27 NOTE — Care Management Note (Signed)
Case Management Note  Patient Details  Name: Spencer Underwood MRN: [id number] Date of Birth: 1941-01-05  Subjective/Objective:                 Independent patient from home in obs for syncope. Spoke to patient at the bedside. No CM consults, needs, orders identified at this time.    Action/Plan:  DC to home self care.  Expected Discharge Date:  12/27/16               Expected Discharge Plan:  Home/Self Care  In-House Referral:     Discharge planning Services  CM Consult  Post Acute Care Choice:    Choice offered to:     DME Arranged:    DME Agency:     HH Arranged:    HH Agency:     Status of Service:  Completed, signed off  If discussed at [employer] of Stay Meetings, dates discussed:    Additional Comments:  Carles Collet, RN 12/27/2016, 1:10 PM

## 2016-12-27 NOTE — Progress Notes (Signed)
*  PRELIMINARY RESULTS* Vascular Ultrasound Carotid Duplex (Doppler) has been completed.   Findings suggest 1-39% internal carotid artery stenosis bilaterally. Vertebral arteries are patent with antegrade flow.  12/27/2016 11:13 AM Maudry Mayhew, BS, RVT, RDCS, RDMS

## 2016-12-27 NOTE — Progress Notes (Signed)
Progress Note  Patient Name: Spencer Underwood Date of Encounter: 12/27/2016  Primary Cardiologist: Dr. Burt Knack  Subjective   No pain or SOB or presyncope overnight.    Inpatient Medications    Scheduled Meds: . aspirin EC  81 mg Oral Daily  . atorvastatin  10 mg Oral q1800  . cholecalciferol  1,000 Units Oral Daily  . enoxaparin (LOVENOX) injection  40 mg Subcutaneous Q24H  . sodium chloride flush  3 mL Intravenous Q12H   Continuous Infusions:  PRN Meds: cyclobenzaprine, ibuprofen, zolpidem   Vital Signs    Vitals:   12/26/16 1639 12/26/16 1643 12/26/16 2020 12/27/16 0526  BP:  119/71 111/72 (!) 109/56  Pulse:   (!) 53 (!) 45  Resp:  16 18 14   Temp:  98.7 F (37.1 C) 98.1 F (36.7 C) 97.4 F (36.3 C)  TempSrc:  Oral Oral Oral  SpO2:  99% 100% 99%  Weight: 163 lb (73.9 kg)   162 lb 8 oz (73.7 kg)  Height: 5\' 10"  (1.778 m)       Intake/Output Summary (Last 24 hours) at 12/27/16 1128 Last data filed at 12/27/16 0900  Gross per 24 hour  Intake              840 ml  Output                0 ml  Net              840 ml   Filed Weights   12/26/16 0838 12/26/16 1639 12/27/16 0526  Weight: 160 lb (72.6 kg) 163 lb (73.9 kg) 162 lb 8 oz (73.7 kg)    Telemetry    NSR, SB, rare ectopy - Personally Reviewed  ECG     - Personally Reviewed  Physical Exam   GEN: No acute distress.   Neck: No  JVD Cardiac: RRR, 2/6 apical systolic murmur without diastolic murmurs, rubs, or gallops.  Respiratory: Clear  to auscultation bilaterally. GI: Soft, nontender, non-distended  MS: No  edema; No deformity. Neuro:  Nonfocal  Psych: Normal affect   Labs    Chemistry Recent Labs Lab 12/26/16 0911 12/26/16 1254 12/27/16 0552  NA 138  --  135  K 4.0  --  3.7  CL 105  --  101  CO2 26  --  26  GLUCOSE 97  --  103*  BUN 20  --  13  CREATININE 0.93 0.97 0.96  CALCIUM 9.3  --  9.1  PROT  --   --  5.7*  ALBUMIN  --   --  3.5  AST  --   --  23  ALT  --   --  16*    ALKPHOS  --   --  42  BILITOT  --   --  0.5  GFRNONAA >60 >60 >60  GFRAA >60 >60 >60  ANIONGAP 7  --  8     Hematology Recent Labs Lab 12/26/16 0911 12/26/16 1254 12/27/16 0552  WBC 5.9 6.6 4.9  RBC 4.45 4.24 3.99*  HGB 13.5 13.1 12.1*  HCT 40.7 38.5* 35.9*  MCV 91.5 90.8 90.0  MCH 30.3 30.9 30.3  MCHC 33.2 34.0 33.7  RDW 13.3 13.4 13.1  PLT 198 182 176    Cardiac Enzymes Recent Labs Lab 12/26/16 0945 12/26/16 1254 12/26/16 1827 12/27/16 0056  TROPONINI 0.03* <0.03 <0.03 <0.03   No results for input(s): TROPIPOC in the last 168 hours.   BNPNo results  for input(s): BNP, PROBNP in the last 168 hours.   DDimer No results for input(s): DDIMER in the last 168 hours.   Radiology    Dg Chest 2 View  Result Date: 12/26/2016 CLINICAL DATA:  Syncope, no cardiac history.  Neck spasms. EXAM: CHEST  2 VIEW COMPARISON:  01/28/2007 FINDINGS: The heart size and mediastinal contours are within normal limits. Both lungs are clear. The visualized skeletal structures are unremarkable. IMPRESSION: No active cardiopulmonary disease. Electronically Signed   By: Kathreen Devoid   On: 12/26/2016 09:46   Ct Head Wo Contrast  Result Date: 12/26/2016 CLINICAL DATA:  Woke up with RIGHT neck spasms, presyncope. History of prostate cancer, hyperlipidemia and syncope. EXAM: CT HEAD WITHOUT CONTRAST CT CERVICAL SPINE WITHOUT CONTRAST TECHNIQUE: Multidetector CT imaging of the head and cervical spine was performed following the standard protocol without intravenous contrast. Multiplanar CT image reconstructions of the cervical spine were also generated. COMPARISON:  Bone scan Jan 08, 2007 FINDINGS: CT HEAD FINDINGS BRAIN: No intraparenchymal hemorrhage, mass effect nor midline shift. The ventricles and sulci are normal for age. Patchy supratentorial white matter hypodensities less than expected for patient's age, though non-specific are most compatible with chronic small vessel ischemic disease. No acute  large vascular territory infarcts. No abnormal extra-axial fluid collections. Basal cisterns are patent. VASCULAR: Mild calcific atherosclerosis of the carotid siphons. SKULL: No skull fracture. No significant scalp soft tissue swelling. SINUSES/ORBITS: Mild paranasal sinus mucosal thickening without air-fluid levels. Mastoid air cells are well aerated.The included ocular globes and orbital contents are non-suspicious. Status post LEFT ocular lens implant. OTHER: None. CT CERVICAL SPINE FINDINGS ALIGNMENT: Maintained lordosis. Vertebral bodies in alignment. Mild broad levoscoliosis. SKULL BASE AND VERTEBRAE: Cervical vertebral bodies and posterior elements are intact. Moderate to severe C5-6 and moderate C6-7 disc height loss with endplate spurring and uncovertebral hypertrophy compatible with degenerative discs. Moderate LEFT C4-5 facet arthropathy. No destructive bony lesions. C1-2 articulation maintained with mild arthropathy. SOFT TISSUES AND SPINAL CANAL: Normal. DISC LEVELS: No significant osseous canal stenosis. Mild RIGHT C2-3, mild RIGHT C4-5, moderate RIGHT C5-6 and mild bilateral C6-7 neural foraminal narrowing. UPPER CHEST: Lung apices are clear. OTHER: None. IMPRESSION: CT HEAD: Normal CT HEAD for age. CT CERVICAL SPINE: No fracture mild 1 minutes. Predominately RIGHT-sided neural foraminal narrowing, moderate at C5-6. Electronically Signed   By: Elon Alas M.D.   On: 12/26/2016 14:08   Ct Cervical Spine Wo Contrast  Result Date: 12/26/2016 CLINICAL DATA:  Woke up with RIGHT neck spasms, presyncope. History of prostate cancer, hyperlipidemia and syncope. EXAM: CT HEAD WITHOUT CONTRAST CT CERVICAL SPINE WITHOUT CONTRAST TECHNIQUE: Multidetector CT imaging of the head and cervical spine was performed following the standard protocol without intravenous contrast. Multiplanar CT image reconstructions of the cervical spine were also generated. COMPARISON:  Bone scan Jan 08, 2007 FINDINGS: CT HEAD  FINDINGS BRAIN: No intraparenchymal hemorrhage, mass effect nor midline shift. The ventricles and sulci are normal for age. Patchy supratentorial white matter hypodensities less than expected for patient's age, though non-specific are most compatible with chronic small vessel ischemic disease. No acute large vascular territory infarcts. No abnormal extra-axial fluid collections. Basal cisterns are patent. VASCULAR: Mild calcific atherosclerosis of the carotid siphons. SKULL: No skull fracture. No significant scalp soft tissue swelling. SINUSES/ORBITS: Mild paranasal sinus mucosal thickening without air-fluid levels. Mastoid air cells are well aerated.The included ocular globes and orbital contents are non-suspicious. Status post LEFT ocular lens implant. OTHER: None. CT CERVICAL SPINE FINDINGS ALIGNMENT: Maintained  lordosis. Vertebral bodies in alignment. Mild broad levoscoliosis. SKULL BASE AND VERTEBRAE: Cervical vertebral bodies and posterior elements are intact. Moderate to severe C5-6 and moderate C6-7 disc height loss with endplate spurring and uncovertebral hypertrophy compatible with degenerative discs. Moderate LEFT C4-5 facet arthropathy. No destructive bony lesions. C1-2 articulation maintained with mild arthropathy. SOFT TISSUES AND SPINAL CANAL: Normal. DISC LEVELS: No significant osseous canal stenosis. Mild RIGHT C2-3, mild RIGHT C4-5, moderate RIGHT C5-6 and mild bilateral C6-7 neural foraminal narrowing. UPPER CHEST: Lung apices are clear. OTHER: None. IMPRESSION: CT HEAD: Normal CT HEAD for age. CT CERVICAL SPINE: No fracture mild 1 minutes. Predominately RIGHT-sided neural foraminal narrowing, moderate at C5-6. Electronically Signed   By: Elon Alas M.D.   On: 12/26/2016 14:08    Cardiac Studies   ECHO:  Final results pending.  (Prelim with NL LV.  Mild MR with two jets of flow.)   Patient Profile     76 y.o. male with a history of prostate cancer, hyperlipidemia, sinus  bradycardia, and previously normal echo and stress test, who is being seen for the evaluation of syncope.    Assessment & Plan    SYNCOPE:  Probably vagal.  He did have some orthostatic BP drop with standing.  We will arrange a follow up event monitor. And follow up with Dr. Burt Knack.  Enzymes negative.  MR:  Mild MR on echo being done. Soft murmur.  This can be followed clinically.    Signed, Minus Breeding, MD  12/27/2016, 11:28 AM

## 2016-12-27 NOTE — Progress Notes (Signed)
Patient ambulated around the unit on room air. Patient's oxygen levels remained >93% at all times.

## 2016-12-27 NOTE — Progress Notes (Signed)
  Echocardiogram 2D Echocardiogram has been performed.  Darlina Sicilian M 12/27/2016, 11:41 AM

## 2016-12-27 NOTE — Care Management Obs Status (Signed)
[place] NOTIFICATION   Patient Details  Name: Spencer Underwood MRN: [id number] Date of Birth: 1941/04/10   Medicare Observation Status Notification Given:  Yes    Carles Collet, RN 12/27/2016, 10:27 AM

## 2017-01-01 ENCOUNTER — Ambulatory Visit (INDEPENDENT_AMBULATORY_CARE_PROVIDER_SITE_OTHER): Payer: Medicare Other

## 2017-01-01 DIAGNOSIS — R55 Syncope and collapse: Secondary | ICD-10-CM

## 2017-01-02 ENCOUNTER — Telehealth: Payer: Self-pay | Admitting: Cardiovascular Disease

## 2017-01-02 NOTE — Telephone Encounter (Signed)
Patient had problem adjusting to [employer] monitor, was not able to charge the cell phone, but was able to after trying his own charger.  Cell phone read poor connection when not charged, but seems to be working fine now.  Patient appreciative of return call.

## 2017-01-02 NOTE — Telephone Encounter (Signed)
New message   Pt is calling for shelly   He has some questions that he did not want to disclose with me   He is asking for a call back he said its an issue with his life watch and that the company cannot help him

## 2017-01-13 ENCOUNTER — Telehealth: Payer: Self-pay | Admitting: Cardiovascular Disease

## 2017-01-13 NOTE — Telephone Encounter (Signed)
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 The pt would like to hold off on making an appointment with Dr Burt Knack at this time until his monitor is resulted. If monitor is normal then he would like to hold off on appointment because he thinks his syncopal episode was a vagal response.  I advised him that we will contact him with results of monitor and if an appointment is needed then we can get this scheduled. Pt agreed with plan.

## 2017-01-13 NOTE — Telephone Encounter (Signed)
New message   Pt returning your phone call.

## 2017-02-04 DIAGNOSIS — S70362A Insect bite (nonvenomous), left thigh, initial encounter: Secondary | ICD-10-CM | POA: Diagnosis not present

## 2017-02-10 DIAGNOSIS — H2511 Age-related nuclear cataract, right eye: Secondary | ICD-10-CM | POA: Diagnosis not present

## 2017-02-10 DIAGNOSIS — H52203 Unspecified astigmatism, bilateral: Secondary | ICD-10-CM | POA: Diagnosis not present

## 2017-02-10 DIAGNOSIS — H26492 Other secondary cataract, left eye: Secondary | ICD-10-CM | POA: Diagnosis not present

## 2017-02-10 DIAGNOSIS — H35372 Puckering of macula, left eye: Secondary | ICD-10-CM | POA: Diagnosis not present

## 2017-02-11 ENCOUNTER — Telehealth: Payer: Self-pay | Admitting: Internal Medicine

## 2017-02-11 NOTE — Telephone Encounter (Signed)
Spencer Underwood, I spoke with [name]. 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 His last colonoscopy was 2011. He will contact you to arrange outpatient colonoscopy with me in the [place] to evaluate his rectal bleeding. Thanks for helping out.

## 2017-02-13 ENCOUNTER — Encounter: Payer: Self-pay | Admitting: Internal Medicine

## 2017-02-13 NOTE — Telephone Encounter (Signed)
Pt scheduled  

## 2017-04-21 ENCOUNTER — Ambulatory Visit (AMBULATORY_SURGERY_CENTER): Payer: Self-pay

## 2017-04-21 VITALS — Ht 69.0 in | Wt 162.0 lb

## 2017-04-21 DIAGNOSIS — K625 Hemorrhage of anus and rectum: Secondary | ICD-10-CM

## 2017-04-21 MED ORDER — NA SULFATE-K SULFATE-MG SULF 17.5-3.13-1.6 GM/177ML PO SOLN: 1.0000 | Freq: Once | ORAL | 0 refills | Status: AC

## 2017-04-21 NOTE — Progress Notes (Signed)
Denies allergies to eggs or soy products. Denies complication of anesthesia or sedation. Denies use of weight loss medication. Denies use of O2.   Emmi instructions given for colonoscopy.  

## 2017-04-23 DIAGNOSIS — R7301 Impaired fasting glucose: Secondary | ICD-10-CM | POA: Diagnosis not present

## 2017-04-23 DIAGNOSIS — E784 Other hyperlipidemia: Secondary | ICD-10-CM | POA: Diagnosis not present

## 2017-04-24 DIAGNOSIS — C801 Malignant (primary) neoplasm, unspecified: Secondary | ICD-10-CM | POA: Insufficient documentation

## 2017-04-24 DIAGNOSIS — M810 Age-related osteoporosis without current pathological fracture: Secondary | ICD-10-CM | POA: Diagnosis not present

## 2017-04-24 DIAGNOSIS — M1990 Unspecified osteoarthritis, unspecified site: Secondary | ICD-10-CM | POA: Insufficient documentation

## 2017-04-24 DIAGNOSIS — Z125 Encounter for screening for malignant neoplasm of prostate: Secondary | ICD-10-CM | POA: Diagnosis not present

## 2017-04-25 DIAGNOSIS — Z23 Encounter for immunization: Secondary | ICD-10-CM | POA: Diagnosis not present

## 2017-04-29 ENCOUNTER — Encounter: Payer: Self-pay | Admitting: Internal Medicine

## 2017-04-29 ENCOUNTER — Ambulatory Visit (AMBULATORY_SURGERY_CENTER): Payer: Medicare Other | Admitting: Internal Medicine

## 2017-04-29 VITALS — BP 120/78 | HR 49 | Temp 95.7°F | Resp 10 | Ht 69.0 in | Wt 162.0 lb

## 2017-04-29 DIAGNOSIS — K5730 Diverticulosis of large intestine without perforation or abscess without bleeding: Secondary | ICD-10-CM | POA: Diagnosis not present

## 2017-04-29 DIAGNOSIS — Z86010 Personal history of colonic polyps: Secondary | ICD-10-CM | POA: Diagnosis not present

## 2017-04-29 DIAGNOSIS — K625 Hemorrhage of anus and rectum: Secondary | ICD-10-CM

## 2017-04-29 DIAGNOSIS — K627 Radiation proctitis: Secondary | ICD-10-CM

## 2017-04-29 DIAGNOSIS — R55 Syncope and collapse: Secondary | ICD-10-CM | POA: Diagnosis not present

## 2017-04-29 MED ORDER — SODIUM CHLORIDE 0.9 % IV SOLN: 500.0000 mL | INTRAVENOUS | Status: DC

## 2017-04-29 NOTE — Op Note (Signed)
[place] Patient Name: Spencer Underwood Procedure Date: 04/29/2017 1:05 PM MRN: [id number] Endoscopist: Docia Chuck. Henrene Pastor , MD Age: 76 Referring MD:  Date of Birth: 15-Nov-1940 Gender: Male Account #: [id number] Procedure:                Colonoscopy Indications:              High risk colon cancer surveillance: Personal                            history of non-advanced adenoma. Minor rectal                            bleeding. Previous examinations 1994, 1998, 2002,                            2006, and 2011 Medicines:                Monitored Anesthesia Care Procedure:                Pre-Anesthesia Assessment:                           - Prior to the procedure, a History and Physical                            was performed, and patient medications and                            allergies were reviewed. The patient's tolerance of                            previous anesthesia was also reviewed. The risks                            and benefits of the procedure and the sedation                            options and risks were discussed with the patient.                            All questions were answered, and informed consent                            was obtained. Prior Anticoagulants: The patient has                            taken no previous anticoagulant or antiplatelet                            agents. ASA Grade Assessment: II - A patient with                            mild systemic disease. After reviewing the risks  and benefits, the patient was deemed in                            satisfactory condition to undergo the procedure.                           After obtaining informed consent, the colonoscope                            was passed under direct vision. Throughout the                            procedure, the patient's blood pressure, pulse, and                            oxygen saturations were monitored continuously. The                         Colonoscope was introduced through the anus and                            advanced to the the cecum, identified by                            appendiceal orifice and ileocecal valve. The                            ileocecal valve, appendiceal orifice, and rectum                            were photographed. The quality of the bowel                            preparation was excellent. The colonoscopy was                            performed without difficulty. The patient tolerated                            the procedure well. The bowel preparation used was                            SUPREP. Scope In: 1:09:51 PM Scope Out: 1:32:36 PM Scope Withdrawal Time: 0 hours 17 minutes 15 seconds  Total Procedure Duration: 0 hours 22 minutes 45 seconds  Findings:                 A few angioectasias consistent with known radiation                            proctitis without bleeding were found in the distal                            rectum.  A few small-mouthed diverticula were found in the                            sigmoid colon.                           The exam was otherwise without abnormality on                            direct and retroflexion views. Complications:            No immediate complications. Estimated blood loss:                            None. Estimated Blood Loss:     Estimated blood loss: none. Impression:               - Mild radiation proctitis.                           - Diverticulosis in the sigmoid colon.                           - The examination was otherwise normal on direct                            and retroflexion views.                           - No specimens collected. Recommendation:           - Repeat colonoscopy is not recommended for                            surveillance.                           - Patient has a contact number available for                            emergencies. The signs and  symptoms of potential                            delayed complications were discussed with the                            patient. Return to normal activities tomorrow.                            Written discharge instructions were provided to the                            patient.                           - Resume previous diet.                           -  Continue present medications. Docia Chuck. Henrene Pastor, MD 04/29/2017 1:40:45 PM This report has been signed electronically.

## 2017-04-29 NOTE — Progress Notes (Signed)
Pt. Reports no change in his medical or surgical history since pre-visit 04/21/2017.

## 2017-04-29 NOTE — Progress Notes (Signed)
Report to PACU, RN, vss, BBS= Clear.  

## 2017-04-29 NOTE — Patient Instructions (Signed)
**  Handout given on Diverticulosis**   YOU HAD AN ENDOSCOPIC PROCEDURE TODAY: Refer to the procedure report and other information in the discharge instructions given to you for any specific questions about what was found during the examination. If this information does not answer your questions, please call [place] office at [phone] to clarify.   YOU SHOULD EXPECT: Some feelings of bloating in the abdomen. Passage of more gas than usual. Walking can help get rid of the air that was put into your GI tract during the procedure and reduce the bloating. If you had a lower endoscopy (such as a colonoscopy or flexible sigmoidoscopy) you may notice spotting of blood in your stool or on the toilet paper. Some abdominal soreness may be present for a day or two, also.  DIET: Your first meal following the procedure should be a light meal and then it is ok to progress to your normal diet. A half-sandwich or bowl of soup is an example of a good first meal. Heavy or fried foods are harder to digest and may make you feel nauseous or bloated. Drink plenty of fluids but you should avoid alcoholic beverages for 24 hours. If you had a esophageal dilation, please see attached instructions for diet.    ACTIVITY: Your care partner should take you home directly after the procedure. You should plan to take it easy, moving slowly for the rest of the day. You can resume normal activity the day after the procedure however YOU SHOULD NOT DRIVE, use power tools, machinery or perform tasks that involve climbing or major physical exertion for 24 hours (because of the sedation medicines used during the test).   SYMPTOMS TO REPORT IMMEDIATELY: A gastroenterologist can be reached at any hour. Please call [phone]  for any of the following symptoms:  Following lower endoscopy (colonoscopy, flexible sigmoidoscopy) Excessive amounts of blood in the stool  Significant tenderness, worsening of abdominal pains  Swelling of the  abdomen that is new, acute  Fever of 100 or higher    FOLLOW UP:  If any biopsies were taken you will be contacted by phone or by letter within the next 1-3 weeks. Call [phone]  if you have not heard about the biopsies in 3 weeks.  Please also call with any specific questions about appointments or follow up tests.  

## 2017-04-30 ENCOUNTER — Telehealth: Payer: Self-pay | Admitting: *Deleted

## 2017-04-30 DIAGNOSIS — R011 Cardiac murmur, unspecified: Secondary | ICD-10-CM | POA: Diagnosis not present

## 2017-04-30 DIAGNOSIS — N528 Other male erectile dysfunction: Secondary | ICD-10-CM | POA: Diagnosis not present

## 2017-04-30 DIAGNOSIS — E784 Other hyperlipidemia: Secondary | ICD-10-CM | POA: Diagnosis not present

## 2017-04-30 DIAGNOSIS — J3089 Other allergic rhinitis: Secondary | ICD-10-CM | POA: Diagnosis not present

## 2017-04-30 DIAGNOSIS — M810 Age-related osteoporosis without current pathological fracture: Secondary | ICD-10-CM | POA: Diagnosis not present

## 2017-04-30 DIAGNOSIS — D7589 Other specified diseases of blood and blood-forming organs: Secondary | ICD-10-CM | POA: Diagnosis not present

## 2017-04-30 DIAGNOSIS — Z6823 Body mass index (BMI) 23.0-23.9, adult: Secondary | ICD-10-CM | POA: Diagnosis not present

## 2017-04-30 DIAGNOSIS — Z0000 Encounter for general adult medical examination without abnormal findings: Secondary | ICD-10-CM | POA: Diagnosis not present

## 2017-04-30 DIAGNOSIS — K627 Radiation proctitis: Secondary | ICD-10-CM | POA: Diagnosis not present

## 2017-04-30 DIAGNOSIS — C61 Malignant neoplasm of prostate: Secondary | ICD-10-CM | POA: Diagnosis not present

## 2017-04-30 DIAGNOSIS — Z1389 Encounter for screening for other disorder: Secondary | ICD-10-CM | POA: Diagnosis not present

## 2017-04-30 DIAGNOSIS — R7301 Impaired fasting glucose: Secondary | ICD-10-CM | POA: Diagnosis not present

## 2017-04-30 NOTE — Telephone Encounter (Signed)
  Follow up Call-  Call back number 04/29/2017  Post procedure Call Back phone  # [phone]  Permission to leave phone message Yes  Some recent data might be hidden    No answer at # given.  LM on VM.

## 2017-06-16 DIAGNOSIS — C61 Malignant neoplasm of prostate: Secondary | ICD-10-CM | POA: Diagnosis not present

## 2017-08-15 DIAGNOSIS — D2272 Melanocytic nevi of left lower limb, including hip: Secondary | ICD-10-CM | POA: Diagnosis not present

## 2017-08-15 DIAGNOSIS — L570 Actinic keratosis: Secondary | ICD-10-CM | POA: Diagnosis not present

## 2017-08-15 DIAGNOSIS — L821 Other seborrheic keratosis: Secondary | ICD-10-CM | POA: Diagnosis not present

## 2017-08-15 DIAGNOSIS — D1801 Hemangioma of skin and subcutaneous tissue: Secondary | ICD-10-CM | POA: Diagnosis not present

## 2017-08-15 DIAGNOSIS — D2261 Melanocytic nevi of right upper limb, including shoulder: Secondary | ICD-10-CM | POA: Diagnosis not present

## 2017-09-10 DIAGNOSIS — H35372 Puckering of macula, left eye: Secondary | ICD-10-CM | POA: Diagnosis not present

## 2017-09-10 DIAGNOSIS — H33302 Unspecified retinal break, left eye: Secondary | ICD-10-CM | POA: Diagnosis not present

## 2017-09-10 DIAGNOSIS — H40003 Preglaucoma, unspecified, bilateral: Secondary | ICD-10-CM | POA: Diagnosis not present

## 2017-09-10 DIAGNOSIS — H26492 Other secondary cataract, left eye: Secondary | ICD-10-CM | POA: Diagnosis not present

## 2017-09-15 DIAGNOSIS — H26492 Other secondary cataract, left eye: Secondary | ICD-10-CM | POA: Diagnosis not present

## 2017-09-18 DIAGNOSIS — H26492 Other secondary cataract, left eye: Secondary | ICD-10-CM | POA: Diagnosis not present

## 2017-12-08 DIAGNOSIS — C61 Malignant neoplasm of prostate: Secondary | ICD-10-CM | POA: Diagnosis not present

## 2018-02-13 DIAGNOSIS — H52203 Unspecified astigmatism, bilateral: Secondary | ICD-10-CM | POA: Diagnosis not present

## 2018-02-13 DIAGNOSIS — H35372 Puckering of macula, left eye: Secondary | ICD-10-CM | POA: Diagnosis not present

## 2018-02-13 DIAGNOSIS — H2511 Age-related nuclear cataract, right eye: Secondary | ICD-10-CM | POA: Diagnosis not present

## 2018-05-09 DIAGNOSIS — Z23 Encounter for immunization: Secondary | ICD-10-CM | POA: Diagnosis not present

## 2018-06-15 DIAGNOSIS — C61 Malignant neoplasm of prostate: Secondary | ICD-10-CM | POA: Diagnosis not present

## 2018-06-17 DIAGNOSIS — M810 Age-related osteoporosis without current pathological fracture: Secondary | ICD-10-CM | POA: Diagnosis not present

## 2018-06-17 DIAGNOSIS — Z125 Encounter for screening for malignant neoplasm of prostate: Secondary | ICD-10-CM | POA: Diagnosis not present

## 2018-06-17 DIAGNOSIS — R82998 Other abnormal findings in urine: Secondary | ICD-10-CM | POA: Diagnosis not present

## 2018-06-17 DIAGNOSIS — E7849 Other hyperlipidemia: Secondary | ICD-10-CM | POA: Diagnosis not present

## 2018-06-17 DIAGNOSIS — R7301 Impaired fasting glucose: Secondary | ICD-10-CM | POA: Diagnosis not present

## 2018-06-22 DIAGNOSIS — E7849 Other hyperlipidemia: Secondary | ICD-10-CM | POA: Diagnosis not present

## 2018-06-24 DIAGNOSIS — Z1389 Encounter for screening for other disorder: Secondary | ICD-10-CM | POA: Diagnosis not present

## 2018-06-24 DIAGNOSIS — N528 Other male erectile dysfunction: Secondary | ICD-10-CM | POA: Diagnosis not present

## 2018-06-24 DIAGNOSIS — E7849 Other hyperlipidemia: Secondary | ICD-10-CM | POA: Diagnosis not present

## 2018-06-24 DIAGNOSIS — C61 Malignant neoplasm of prostate: Secondary | ICD-10-CM | POA: Diagnosis not present

## 2018-06-24 DIAGNOSIS — Z0000 Encounter for general adult medical examination without abnormal findings: Secondary | ICD-10-CM | POA: Diagnosis not present

## 2018-06-24 DIAGNOSIS — D126 Benign neoplasm of colon, unspecified: Secondary | ICD-10-CM | POA: Diagnosis not present

## 2018-06-24 DIAGNOSIS — R7301 Impaired fasting glucose: Secondary | ICD-10-CM | POA: Diagnosis not present

## 2018-06-24 DIAGNOSIS — Z6823 Body mass index (BMI) 23.0-23.9, adult: Secondary | ICD-10-CM | POA: Diagnosis not present

## 2018-06-24 DIAGNOSIS — J309 Allergic rhinitis, unspecified: Secondary | ICD-10-CM | POA: Diagnosis not present

## 2018-06-24 DIAGNOSIS — D7589 Other specified diseases of blood and blood-forming organs: Secondary | ICD-10-CM | POA: Diagnosis not present

## 2018-06-26 DIAGNOSIS — Z1212 Encounter for screening for malignant neoplasm of rectum: Secondary | ICD-10-CM | POA: Diagnosis not present

## 2018-07-15 DIAGNOSIS — M810 Age-related osteoporosis without current pathological fracture: Secondary | ICD-10-CM | POA: Diagnosis not present

## 2018-08-07 DIAGNOSIS — L821 Other seborrheic keratosis: Secondary | ICD-10-CM | POA: Diagnosis not present

## 2018-08-07 DIAGNOSIS — L853 Xerosis cutis: Secondary | ICD-10-CM | POA: Diagnosis not present

## 2018-08-07 DIAGNOSIS — D1801 Hemangioma of skin and subcutaneous tissue: Secondary | ICD-10-CM | POA: Diagnosis not present

## 2018-08-07 DIAGNOSIS — L570 Actinic keratosis: Secondary | ICD-10-CM | POA: Diagnosis not present

## 2018-08-07 DIAGNOSIS — B070 Plantar wart: Secondary | ICD-10-CM | POA: Diagnosis not present

## 2018-08-07 DIAGNOSIS — L812 Freckles: Secondary | ICD-10-CM | POA: Diagnosis not present

## 2018-09-13 DIAGNOSIS — H40003 Preglaucoma, unspecified, bilateral: Secondary | ICD-10-CM | POA: Diagnosis not present

## 2018-09-13 DIAGNOSIS — G43809 Other migraine, not intractable, without status migrainosus: Secondary | ICD-10-CM | POA: Diagnosis not present

## 2018-09-13 DIAGNOSIS — H33102 Unspecified retinoschisis, left eye: Secondary | ICD-10-CM | POA: Diagnosis not present

## 2018-09-13 DIAGNOSIS — H35372 Puckering of macula, left eye: Secondary | ICD-10-CM | POA: Diagnosis not present

## 2018-09-17 ENCOUNTER — Encounter (HOSPITAL_COMMUNITY): Payer: Self-pay

## 2018-09-17 ENCOUNTER — Emergency Department (HOSPITAL_COMMUNITY)
Admission: EM | Admit: 2018-09-17 | Discharge: 2018-09-18 | Disposition: A | Discharge status: Home / Self Care | Payer: Medicare Other | Attending: Emergency Medicine | Admitting: Emergency Medicine

## 2018-09-17 ENCOUNTER — Emergency Department (HOSPITAL_COMMUNITY): Payer: Medicare Other

## 2018-09-17 DIAGNOSIS — N189 Chronic kidney disease, unspecified: Secondary | ICD-10-CM | POA: Insufficient documentation

## 2018-09-17 DIAGNOSIS — I2510 Atherosclerotic heart disease of native coronary artery without angina pectoris: Secondary | ICD-10-CM | POA: Diagnosis not present

## 2018-09-17 DIAGNOSIS — R413 Other amnesia: Secondary | ICD-10-CM | POA: Diagnosis not present

## 2018-09-17 DIAGNOSIS — Z8546 Personal history of malignant neoplasm of prostate: Secondary | ICD-10-CM | POA: Diagnosis not present

## 2018-09-17 DIAGNOSIS — R4701 Aphasia: Secondary | ICD-10-CM | POA: Diagnosis not present

## 2018-09-17 DIAGNOSIS — R001 Bradycardia, unspecified: Secondary | ICD-10-CM | POA: Diagnosis not present

## 2018-09-17 DIAGNOSIS — Z79899 Other long term (current) drug therapy: Secondary | ICD-10-CM | POA: Insufficient documentation

## 2018-09-17 DIAGNOSIS — R4182 Altered mental status, unspecified: Secondary | ICD-10-CM | POA: Diagnosis not present

## 2018-09-17 DIAGNOSIS — G454 Transient global amnesia: Secondary | ICD-10-CM | POA: Diagnosis not present

## 2018-09-17 LAB — DIFFERENTIAL
Abs Immature Granulocytes: 0.04 10*3/uL (ref 0.00–0.07)
BASOS ABS: 0.1 10*3/uL (ref 0.0–0.1)
Basophils Relative: 1 %
Eosinophils Absolute: 0.1 10*3/uL (ref 0.0–0.5)
Eosinophils Relative: 2 %
Immature Granulocytes: 1 %
Lymphocytes Relative: 27 %
Lymphs Abs: 1.4 10*3/uL (ref 0.7–4.0)
Monocytes Absolute: 0.5 10*3/uL (ref 0.1–1.0)
Monocytes Relative: 9 %
Neutro Abs: 3.1 10*3/uL (ref 1.7–7.7)
Neutrophils Relative %: 60 %

## 2018-09-17 LAB — CBC
HEMATOCRIT: 40.5 % (ref 39.0–52.0)
Hemoglobin: 13.2 g/dL (ref 13.0–17.0)
MCH: 30.3 pg (ref 26.0–34.0)
MCHC: 32.6 g/dL (ref 30.0–36.0)
MCV: 92.9 fL (ref 80.0–100.0)
Platelets: 235 10*3/uL (ref 150–400)
RBC: 4.36 MIL/uL (ref 4.22–5.81)
RDW: 13 % (ref 11.5–15.5)
WBC: 5.1 10*3/uL (ref 4.0–10.5)
nRBC: 0 % (ref 0.0–0.2)

## 2018-09-17 LAB — COMPREHENSIVE METABOLIC PANEL
ALT: 19 U/L (ref 0–44)
AST: 28 U/L (ref 15–41)
Albumin: 4.2 g/dL (ref 3.5–5.0)
Alkaline Phosphatase: 42 U/L (ref 38–126)
Anion gap: 11 (ref 5–15)
BUN: 18 mg/dL (ref 8–23)
CALCIUM: 9.4 mg/dL (ref 8.9–10.3)
CO2: 26 mmol/L (ref 22–32)
CREATININE: 1.07 mg/dL (ref 0.61–1.24)
Chloride: 99 mmol/L (ref 98–111)
GFR calc Af Amer: >60 mL/min (ref >60)
GFR calc non Af Amer: >60 mL/min (ref >60)
Glucose, Bld: 109 mg/dL — ABNORMAL HIGH (ref 70–99)
Potassium: 3.9 mmol/L (ref 3.5–5.1)
Sodium: 136 mmol/L (ref 135–145)
Total Bilirubin: 0.8 mg/dL (ref 0.3–1.2)
Total Protein: 7.4 g/dL (ref 6.5–8.1)

## 2018-09-17 LAB — I-STAT CHEM 8, ED
BUN: 21 mg/dL (ref 8–23)
CALCIUM ION: 1.21 mmol/L (ref 1.15–1.40)
Chloride: 99 mmol/L (ref 98–111)
Creatinine, Ser: 0.9 mg/dL (ref 0.61–1.24)
Glucose, Bld: 104 mg/dL — ABNORMAL HIGH (ref 70–99)
HCT: 41 % (ref 39.0–52.0)
Hemoglobin: 13.9 g/dL (ref 13.0–17.0)
Potassium: 3.9 mmol/L (ref 3.5–5.1)
SODIUM: 135 mmol/L (ref 135–145)
TCO2: 26 mmol/L (ref 22–32)

## 2018-09-17 LAB — I-STAT TROPONIN, ED: Troponin i, poc: 0.01 ng/mL (ref 0.00–0.08)

## 2018-09-17 LAB — PROTIME-INR
INR: 1.03
Prothrombin Time: 13.4 seconds (ref 11.4–15.2)

## 2018-09-17 LAB — APTT: aPTT: 32 seconds (ref 24–36)

## 2018-09-17 MED ORDER — LORAZEPAM 2 MG/ML IJ SOLN
1.0000 mg | Freq: Once | INTRAMUSCULAR | Status: AC
  Administered 2018-09-17: 1 mg via INTRAVENOUS
  Filled 2018-09-17: qty 1

## 2018-09-17 NOTE — ED Notes (Signed)
Patient transported to MRI 

## 2018-09-17 NOTE — ED Triage Notes (Signed)
Pt endorses 20 minute period of aphasia just prior to arrival where pt could not recall anyone's names. Sx now resolved. Pt had similar episode on Sunday. No neuro deficits at this time. Axox4.

## 2018-09-17 NOTE — Discharge Instructions (Addendum)
Please call Dr Joylene Draft in the morning for follow up

## 2018-09-18 ENCOUNTER — Other Ambulatory Visit: Payer: Self-pay | Admitting: Internal Medicine

## 2018-09-18 ENCOUNTER — Other Ambulatory Visit (HOSPITAL_COMMUNITY): Payer: Self-pay | Admitting: Internal Medicine

## 2018-09-18 DIAGNOSIS — Z6823 Body mass index (BMI) 23.0-23.9, adult: Secondary | ICD-10-CM | POA: Diagnosis not present

## 2018-09-18 DIAGNOSIS — G458 Other transient cerebral ischemic attacks and related syndromes: Secondary | ICD-10-CM | POA: Diagnosis not present

## 2018-09-18 DIAGNOSIS — G459 Transient cerebral ischemic attack, unspecified: Secondary | ICD-10-CM

## 2018-09-18 MED ORDER — ASPIRIN EC 81 MG PO TBEC
81.0000 mg | DELAYED_RELEASE_TABLET | Freq: Once | ORAL | Status: AC
  Administered 2018-09-18: 81 mg via ORAL
  Filled 2018-09-18: qty 1

## 2018-09-18 NOTE — ED Provider Notes (Signed)
[facility] EMERGENCY DEPARTMENT Provider Note   CSN: [id number] Arrival date & time: 09/17/18  1632     History   Chief Complaint Chief Complaint  Patient presents with  . Transient Ischemic Attack    HPI Spencer Underwood is a 78 y.o. male.  HPI Patient is a 78 year old male with no prior history of TIA or stroke who presents the emergency department with approximately 15 to 20 minutes of difficulty thinking of close friends names.  He felt somewhat foggy headed.  This was not normal for the patient.  He denied weakness of his arms or legs.  He had no dysarthria or abnormal facial appearance as per his wife.  No headache at the time.  No history of similar symptoms.  He is concerned about the possibility of TIA/stroke and thus he presented to the emergency department for further evaluation.  He does have a history of hyperlipidemia.  No history of tobacco abuse.  Denies a history of hypertension diabetes.  He recently traveled to [place] and is still catching up on his sleep and felt somewhat jet lag this week.  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  He works out 3 days a week.  He has had no significant health issues over the past year.  No recent illness this week.  States he feels back to normal at this time.  Wife reports no dysarthric speech.  She noted no facial asymmetry either.  He did have transient issues with vision out of his eye on Monday that was transient as well and was described as a transient visual change in which there was a slight cutting of his vision.  He was seen by his ophthalmologist and no clear etiology was found at that time.  He felt as though maybe he was having a retinal detachment but was found to have a normal retina.   Past Medical History:  Diagnosis Date  . Allergy   . Arthritis   . Cancer ([place])   . Cataract   . Chronic kidney disease   . Coronary atherosclerosis    a. 2012 - incidentally noted on  CT scan of chest;  b. 08/2011 Myoview: normal; c. 03/2015 CPX @ [facility]: normal w/ PAC's, PVC's, and a 3 beat run of NSVT in recovery.  Marland Kitchen Heart murmur    asymptomatic function murmur.  Marland Kitchen History of echocardiogram    a. 12/2004 Echo: Nl EF, trace TR/MR.  Marland Kitchen Hx of radiation therapy   . Hyperlipidemia   . Personal history of malignant neoplasm of prostate    a. 2008 s/p prostatecomtomy-->radiation-->currently on Lupron injections.  . Sinus bradycardia   . Syncope    a. 12/2016    Patient Active Problem List   Diagnosis Date Noted  . Arthritis   . Cancer ([place])   . Elevated troponin   . Syncope 12/26/2016  . Family history of early CAD 12/26/2016  . Hyperlipidemia 08/15/2011  . PERSONAL HISTORY MALIGNANT NEOPLASM PROSTATE 07/11/2010    Past Surgical History:  Procedure Laterality Date  . PROSTATECTOMY    . ROBOT ASSISTED LAPAROSCOPIC RADICAL PROSTATECTOMY    . SHOULDER SURGERY Right   . SKIN GRAFT Left   . TONSILLECTOMY          Home Medications    Prior to Admission medications   Medication Sig Start Date End Date Taking? Authorizing Provider  atorvastatin (LIPITOR) 20 MG tablet Take 20 mg by mouth  daily. 08/09/18  Yes [provider]  B Complex Vitamins (VITAMIN B COMPLEX PO) Take 1 tablet by mouth daily.   Yes [provider]  cholecalciferol (VITAMIN D) 1000 units tablet Take 1,000 Units by mouth daily.   Yes [provider]  ibuprofen (ADVIL,MOTRIN) 400 MG tablet Take 1 tablet (400 mg total) by mouth every 4 (four) hours as needed for mild pain or moderate pain. 12/27/16  Yes Jani Gravel, MD  leuprolide (LUPRON) 30 MG injection Inject 30 mg into the muscle See admin instructions. Inject 30 mg IM every 6 months   Yes [provider]  magnesium oxide (MAG-OX) 400 MG tablet Take 400 mg by mouth daily.   Yes [provider]  Misc Natural Products (OSTEO BI-FLEX ADV DOUBLE ST PO) Take 1 tablet by mouth daily.    Yes [provider]   zolpidem (AMBIEN) 10 MG tablet Take 10 mg by mouth at bedtime as needed for sleep.  07/19/11  Yes [provider]  atorvastatin (LIPITOR) 10 MG tablet TAKE 1 TABLET EVERY DAY Patient not taking: No sig reported 09/30/14   Sherren Mocha, MD  mesalamine (CANASA) 1000 MG suppository Place 1 suppository (1,000 mg total) rectally at bedtime. Patient not taking: Reported on 09/17/2018 12/18/10 09/17/18  Sable Feil, MD    Family History Family History  Problem Relation Age of Onset  . Hypertension Mother   . Thyroid disease Mother   . Stroke Father   . Hypertension Father   . Coronary artery disease Father   . Colon cancer Neg Hx   . Esophageal cancer Neg Hx   . Pancreatic cancer Neg Hx   . Rectal cancer Neg Hx   . Stomach cancer Neg Hx     Social History Social History   Tobacco Use  . Smoking status: Never Smoker  . Smokeless tobacco: Never Used  Substance Use Topics  . Alcohol use: Yes    Alcohol/week: 1.0 standard drinks    Types: 1 Cans of beer per week    Comment: daily  . Drug use: No     Allergies   Patient has no known allergies.   Review of Systems Review of Systems  All other systems reviewed and are negative.    Physical Exam Updated Vital Signs BP (!) 156/88   Pulse (!) 57   Temp 97.7 F (36.5 C) (Oral)   Resp 16   SpO2 100%   Physical Exam Vitals signs and nursing note reviewed.  Constitutional:      Appearance: He is well-developed.  HENT:     Head: Normocephalic and atraumatic.  Eyes:     Pupils: Pupils are equal, round, and reactive to light.  Neck:     Musculoskeletal: Normal range of motion.  Cardiovascular:     Rate and Rhythm: Normal rate and regular rhythm.     Heart sounds: Normal heart sounds.  Pulmonary:     Effort: Pulmonary effort is normal. No respiratory distress.     Breath sounds: Normal breath sounds.  Abdominal:     General: There is no distension.     Palpations: Abdomen is soft.     Tenderness:  There is no abdominal tenderness.  Musculoskeletal: Normal range of motion.  Skin:    General: Skin is warm and dry.  Neurological:     Mental Status: He is alert and oriented to person, place, and time.     Comments: 5/5 strength in major muscle groups of  bilateral upper and lower extremities. Speech normal. No facial asymetry.   Psychiatric:        Judgment: Judgment normal.      ED Treatments / Results  Labs (all labs ordered are listed, but only abnormal results are displayed) Labs Reviewed  COMPREHENSIVE METABOLIC PANEL - Abnormal; Notable for the following components:      Result Value   Glucose, Bld 109 (*)    All other components within normal limits  I-STAT CHEM 8, ED - Abnormal; Notable for the following components:   Glucose, Bld 104 (*)    All other components within normal limits  PROTIME-INR  APTT  CBC  DIFFERENTIAL  I-STAT TROPONIN, ED    EKG EKG Interpretation  Date/Time:  Thursday September 17 2018 16:57:14 EST Ventricular Rate:  55 PR Interval:  194 QRS Duration: 86 QT Interval:  408 QTC Calculation: 390 R Axis:   64 Text Interpretation:  Sinus bradycardia Septal infarct , age undetermined Abnormal ECG No significant change was found Confirmed by Jola Schmidt [phone]) on 09/17/2018 9:47:46 PM   Radiology Ct Head Wo Contrast  Result Date: 09/17/2018 CLINICAL DATA:  Mental status changes. EXAM: CT HEAD WITHOUT CONTRAST TECHNIQUE: Contiguous axial images were obtained from the base of the skull through the vertex without intravenous contrast. COMPARISON:  12/26/2016 FINDINGS: Brain: There is no evidence for acute hemorrhage, hydrocephalus, mass lesion, or abnormal extra-axial fluid collection. No definite CT evidence for acute infarction. Diffuse loss of parenchymal volume is consistent with atrophy. Patchy low attenuation in the deep hemispheric and periventricular white matter is nonspecific, but likely reflects chronic microvascular ischemic  demyelination. Vascular: No hyperdense vessel or unexpected calcification. Skull: No evidence for fracture. No worrisome lytic or sclerotic lesion. Sinuses/Orbits: Trace fluid noted posterior left mastoid air cells. Remaining mastoid air cells and paranasal sinuses are clear. Visualized portions of the globes and intraorbital fat are unremarkable. Other: None. IMPRESSION: 1. No acute intracranial abnormality. 2. Mild atrophy with chronic small vessel white matter ischemic disease. Electronically Signed   By: Misty Stanley M.D.   On: 09/17/2018 18:20   Mr Jodene Nam Head Wo Contrast  Result Date: 09/17/2018 CLINICAL DATA:  Twenty minute aphasia episode, now resolved. EXAM: MRI HEAD WITHOUT CONTRAST MRA HEAD WITHOUT CONTRAST TECHNIQUE: Multiplanar, multiecho pulse sequences of the brain and surrounding structures were obtained without intravenous contrast. Angiographic images of the head were obtained using MRA technique without contrast. COMPARISON:  None. FINDINGS: MRI HEAD FINDINGS BRAIN: There is no acute infarct, acute hemorrhage or mass effect. The midline structures are normal. There are no old infarcts. Multifocal white matter hyperintensity, most commonly due to chronic ischemic microangiopathy. Generalized atrophy without lobar predilection. Susceptibility-sensitive sequences show no chronic microhemorrhage or superficial siderosis. SKULL AND UPPER CERVICAL SPINE: The visualized skull base, calvarium, upper cervical spine and extracranial soft tissues are normal. SINUSES/ORBITS: No fluid levels or advanced mucosal thickening. No mastoid or middle ear effusion. The orbits are normal. MRA HEAD FINDINGS POSTERIOR CIRCULATION: --Basilar artery: Normal. --Posterior cerebral arteries: Normal. The left PCA is predominantly supplied by the posterior communicating artery. --Superior cerebellar arteries: Normal. --Inferior cerebellar arteries: Shared origin of AICA and PICA bilaterally. ANTERIOR CIRCULATION:  --Intracranial internal carotid arteries: Normal. --Anterior cerebral arteries: Normal. Both A1 segments are present. Patent anterior communicating artery. --Middle cerebral arteries: Normal. --Posterior communicating arteries: Present on the left, absent on the right. IMPRESSION: 1. No acute intracranial abnormality. 2. Chronic small vessel ischemia changes of the white matter and generalized atrophy. 3. Normal  intracranial MRA. Electronically Signed   By: Ulyses Jarred M.D.   On: 09/17/2018 23:52   Mr Brain Wo Contrast  Result Date: 09/17/2018 CLINICAL DATA:  Twenty minute aphasia episode, now resolved. EXAM: MRI HEAD WITHOUT CONTRAST MRA HEAD WITHOUT CONTRAST TECHNIQUE: Multiplanar, multiecho pulse sequences of the brain and surrounding structures were obtained without intravenous contrast. Angiographic images of the head were obtained using MRA technique without contrast. COMPARISON:  None. FINDINGS: MRI HEAD FINDINGS BRAIN: There is no acute infarct, acute hemorrhage or mass effect. The midline structures are normal. There are no old infarcts. Multifocal white matter hyperintensity, most commonly due to chronic ischemic microangiopathy. Generalized atrophy without lobar predilection. Susceptibility-sensitive sequences show no chronic microhemorrhage or superficial siderosis. SKULL AND UPPER CERVICAL SPINE: The visualized skull base, calvarium, upper cervical spine and extracranial soft tissues are normal. SINUSES/ORBITS: No fluid levels or advanced mucosal thickening. No mastoid or middle ear effusion. The orbits are normal. MRA HEAD FINDINGS POSTERIOR CIRCULATION: --Basilar artery: Normal. --Posterior cerebral arteries: Normal. The left PCA is predominantly supplied by the posterior communicating artery. --Superior cerebellar arteries: Normal. --Inferior cerebellar arteries: Shared origin of AICA and PICA bilaterally. ANTERIOR CIRCULATION: --Intracranial internal carotid arteries: Normal. --Anterior  cerebral arteries: Normal. Both A1 segments are present. Patent anterior communicating artery. --Middle cerebral arteries: Normal. --Posterior communicating arteries: Present on the left, absent on the right. IMPRESSION: 1. No acute intracranial abnormality. 2. Chronic small vessel ischemia changes of the white matter and generalized atrophy. 3. Normal intracranial MRA. Electronically Signed   By: Ulyses Jarred M.D.   On: 09/17/2018 23:52    Procedures Procedures (including critical care time)  Medications Ordered in ED Medications  aspirin EC tablet 81 mg (has no administration in time range)  LORazepam (ATIVAN) injection 1 mg (1 mg Intravenous Given 09/17/18 2242)     Initial Impression / Assessment and Plan / ED Course  I have reviewed the triage vital signs and the nursing notes.  Pertinent labs & imaging results that were available during my care of the patient were reviewed by me and considered in my medical decision making (see chart for details).     Nonfocal examination at this time.  Initial labs and CT imaging performed prior to my evaluation demonstrate no significant abnormality.  MRI of his brain as well as MRA head demonstrate no acute abnormality and no acute stroke.  This could have represented TIA.  Patient understands this.  He has a primary care physician in the community who he will contact tomorrow for close follow-up.  He will need the remainder of his TIA work-up performed as an outpatient which his primary care office performs routinely as he follows with [facility].  Patient understands return to the emergency department for new or worsening symptoms or recurrent/new neurologic symptoms.  He will be given 81 mg aspirin at this time.  He will need to stay on 81 mg aspirin until he is seen evaluated by his primary care physician.  All questions answered.  Final Clinical Impressions(s) / ED Diagnoses   Final diagnoses:  Transient memory loss    ED Discharge  Orders    None       Jola Schmidt, MD 09/18/18 [phone]

## 2018-09-18 NOTE — ED Notes (Signed)
ED Provider at bedside. 

## 2018-09-22 ENCOUNTER — Ambulatory Visit (HOSPITAL_COMMUNITY): Payer: Medicare Other | Attending: Cardiovascular Disease

## 2018-09-22 ENCOUNTER — Other Ambulatory Visit (HOSPITAL_COMMUNITY): Payer: Medicare Other

## 2018-09-22 ENCOUNTER — Other Ambulatory Visit: Payer: Self-pay

## 2018-09-22 DIAGNOSIS — G459 Transient cerebral ischemic attack, unspecified: Secondary | ICD-10-CM | POA: Diagnosis not present

## 2018-09-23 ENCOUNTER — Other Ambulatory Visit (HOSPITAL_COMMUNITY): Payer: Medicare Other

## 2018-10-27 DIAGNOSIS — H40003 Preglaucoma, unspecified, bilateral: Secondary | ICD-10-CM | POA: Diagnosis not present

## 2018-10-27 DIAGNOSIS — H35372 Puckering of macula, left eye: Secondary | ICD-10-CM | POA: Diagnosis not present

## 2018-10-27 DIAGNOSIS — H532 Diplopia: Secondary | ICD-10-CM | POA: Diagnosis not present

## 2018-10-27 DIAGNOSIS — G43809 Other migraine, not intractable, without status migrainosus: Secondary | ICD-10-CM | POA: Diagnosis not present

## 2018-12-08 ENCOUNTER — Telehealth: Payer: Self-pay | Admitting: Neurology

## 2018-12-08 ENCOUNTER — Encounter: Payer: Self-pay | Admitting: Neurology

## 2018-12-08 NOTE — Telephone Encounter (Signed)
Called the patient to inform them that our office has placed new protocols in place for our office visits. Due to the virus pandemic our office is reducing our number of office visits in order to minimize the risk to our patients and healthcare providers. Advised that our office is now providing the capability to offer the patients phone visits at this time. Informed of what that process looks like and informed that the telephone office visit will still be billed through insurance and due to [place] we need them to know since the appointment is taking place over the phone, we can't guarantee the security of the phone line. With that said if we do move forward I would have to get verbal consent to completed the call over the phone.  Patient gave verbal consent for the video visit and verbalized understanding of above. I reviewed the patient's chart with him and make sure everything is updated. The patient has not had a sleep study completed before.  Pt's email is [email or web address]. Pt understands that the [employer] webex software must be downloaded and operational on the device pt plans to use for the visit. Pt verbalized understanding of completing the sleep scale and neck measurement prior to the apt and replying the answers on mychart.

## 2018-12-14 DIAGNOSIS — M8589 Other specified disorders of bone density and structure, multiple sites: Secondary | ICD-10-CM | POA: Diagnosis not present

## 2018-12-14 DIAGNOSIS — C61 Malignant neoplasm of prostate: Secondary | ICD-10-CM | POA: Diagnosis not present

## 2018-12-16 ENCOUNTER — Telehealth: Payer: Self-pay | Admitting: Neurology

## 2018-12-16 ENCOUNTER — Other Ambulatory Visit: Payer: Self-pay

## 2018-12-16 ENCOUNTER — Ambulatory Visit (INDEPENDENT_AMBULATORY_CARE_PROVIDER_SITE_OTHER): Payer: Medicare Other | Admitting: Neurology

## 2018-12-16 DIAGNOSIS — R208 Other disturbances of skin sensation: Secondary | ICD-10-CM | POA: Diagnosis not present

## 2018-12-16 DIAGNOSIS — G4700 Insomnia, unspecified: Secondary | ICD-10-CM

## 2018-12-16 DIAGNOSIS — Z8249 Family history of ischemic heart disease and other diseases of the circulatory system: Secondary | ICD-10-CM

## 2018-12-16 DIAGNOSIS — R351 Nocturia: Secondary | ICD-10-CM | POA: Diagnosis not present

## 2018-12-16 DIAGNOSIS — H532 Diplopia: Secondary | ICD-10-CM

## 2018-12-16 DIAGNOSIS — R0683 Snoring: Secondary | ICD-10-CM

## 2018-12-16 DIAGNOSIS — Z8546 Personal history of malignant neoplasm of prostate: Secondary | ICD-10-CM

## 2018-12-16 DIAGNOSIS — G3184 Mild cognitive impairment, so stated: Secondary | ICD-10-CM | POA: Diagnosis not present

## 2018-12-16 DIAGNOSIS — G459 Transient cerebral ischemic attack, unspecified: Secondary | ICD-10-CM | POA: Diagnosis not present

## 2018-12-16 NOTE — Telephone Encounter (Signed)
Called the patient. He had difficulty with getting onto his apt wth Dr Brett Fairy today. Dr Brett Fairy has asked that the patient reschedule to Friday at 9:30 am. I will keep this apt on for today and Dr Dohmeier will document on this visit and complete the video visit on Friday. Pt verbalized understanding. The test went well between the patient and I and shouldn't have any problems for the visit on Friday.

## 2018-12-16 NOTE — Progress Notes (Signed)
Virtual Visit via Video Note  I connected with Spencer Cree, Spencer Underwood on 12/16/18 at 11:00 AM EDT by a video enabled telemedicine application and verified that I am speaking with the correct person using two identifiers.   I discussed the limitations of evaluation and management by telemedicine and the availability of in person appointments. The patient expressed understanding and agreed to proceed. We postponed our meeting to Friday 12-18-2018 due to technical difficulties.    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.  Chief complaint according to patient : There are several concerns that Dr. Hessie Diener wanted to bring to my attention.  He considers himself a lifelong snorer he has used bite guard he had a bilateral turbinate reduction and had jaw surgery which have improved snoring and over the years has developed more fragmented sleep.  He himself has raised the question of apnea may be a cause.  In addition he has noted that he has some memory slips, and his eye doctor had noted changes in the left eye related to the development of an epiretinal membrane which could be associated with the development of OSA.  In April 2018 he had shooting pains into the neck and lower face and 2 years ago this seems to have precipitated the syncope he was diagnosed with an upper respiratory infect and cervical neuralgia.  In fall 2019 he developed diplopia, he notices first and Churchman images per horizontal to each other the object of his focus more than 20 feet away.  The diplopia improved after atorvastatin was just continued.  He states that on 13 September 2018 he had noticed left eye blurring after 30 hours of travel  being sleep deprived returning from [place].  Dr. [name] spoke to  him and recommended first magnesium p.o.- on 16 January he had certainly suddenly word finding problems- and his affect had changed, according to his wife.   He also stated that he felt disoriented while driving for seconds or minutes. Sometimes he experienced the inability to visualize the way to the destination and he had to literally think about which route to take before he drove off the parking lot.  This was followed by the emergency room visit where a  CT of the head was obtained, ( negative)  followed by MRI and MRA at [facility]. These showed no acute stroke but chronic small vessel disease and some brain atrophy. On 17th January 2020 he received a diagnosis of a possible TIA or ophthalmic migraine and was asked to start on 81 mg of aspirin daily.  He remained for a while this difficulties recalling names and he would replace words of Sinemet in the legs and syllable count is another, after Dr. Haynes Kerns discontinued the atorvastatin most of this has returned to normal and he remains on no statin at all at this time. On April 5 the magnesium was discontinued after he had developed tingling and dysesthesias of his back the tingling got better.  He also advised me that he has a longstanding history of some spinal disk degenerative disease and had back pain even before his retirement 8 years ago.  What he is  concerned about is that he still may walk to the afternoon but ends up opening the fridge.  He states that he would turn the wrong way into the wrong room sometimes.  He still has to think about the root of driving that may take several seconds up to a minute before he drives a familiar distance and way.  He named the example of the friendly shopping center back to his home when he has to actively think work to turn next.  The same is true for his drive to the post office.  This seems only to be a problem when he initiates the  drive, not in long distance driving. He has had no problems to reach his  prostate cancer follow-ups at [facility] and it is not difficult for him to find his way back and forth.   He has been followed for prostate cancer at [facility] after radical prostatectomy, radiation therapy and Lupron therapy his PSA has remained very low the diagnosis of cancer was made in 2008.   Medical history : see above   Family sleep/ medical history: His father was born in 73, he suffered in his 31s of myocardial infarction had been diagnosed with coronary artery disease in his 44s and suffered late in his life a stroke but died at 77 years of age.  His mother had hypertension. children are healthy.   Social history: married for 44 years to [place] , born in [place], the  adult children living in other states.  Daughter 52 years of age with 3 teenage children lives in [place] is some 23 with 2 children lives in [place] in [place].  The patient states that he has never used tobacco he drinks 1 glass of wine a day on weekends maybe 2 or 3 in 1 night, he uses coffee 4 cups a day usually dark roast sometimes diet Coke at lunch while working over eating out.  He is retired since 2012. Regular exerciser, running, golfing or walking daily.    Sleep habits are as follows: Sleep habits for the couple have changed since the corona crisis began, doing quarantine times dinner is at 6:30 PM bedtime between 10 and 11 PM in a cool, quiet and dark bedroom.  The patient likes to sleep on his sides he does avoid prolonged sleep he also avoid supine sleep for back reasons he uses only one pillow and sleeps on a flat non-adjusted bed.  His usual sleep latency maybe 15 minutes but he always wakes up between 1230 and 1 AM and has a bathroom break he may have another one at 2 and at 4 AM after which she sometimes sleeps through until 7 AM he states that he is reasonably refreshed and restored but sometimes wakes with a dry mouth.  He  also states that he used to have vivid dreams and less of that is now evident for him.  He has no dream enactment history and he does not report recurrent dreams or dreams of nightmarish character.  His overall total sleep time is estimated only 5 hours but his bedtime is about 8 hours.  2 nights a week he will wake up with a tingling it that is concentrated between the shoulder blades on his back.  Bathroom breaks on average 3 times at night.  He has of those nights when the tingling starts begun to take an antihistamine.  I mentioned my concern about memory being possibly affected by anticholinergic side effects  medications and he is taking an over-the-counter [facility] and antihistamine was mildly apt.  He denies any naps in daytime.     Review of Systems: Out of a complete 14 system review, the patient complains of only the following symptoms, and all other reviewed systems are negative. How likely are you to doze in the following situations: 0 = not likely, 1 = slight chance, 2 = moderate chance, 3 = high chance  Sitting and Reading? Watching Television? Sitting inactive in a public place (theater or meeting)? Lying down in the afternoon when circumstances permit? Sitting and talking to someone? Sitting quietly after lunch without alcohol? In a car, while stopped for a few minutes in traffic? As a passenger in a car for an hour without a break?  Total = endorsed at 7 points,fatigue 22/63 points. Nocturia  attributed to prostate cancer.   Memory loss- MCI- diplopia, tingling numbness.    Social History   Socioeconomic History   Marital status: Married    Spouse name: Not on file   Number of children: Not on file   Years of education: Not on file   Highest education level: Not on file  Occupational History   Occupation: Retired Engineer, drilling  Social Designer, fashion/clothing strain: Not on file   Food insecurity:    Worry: Not on file    Inability:  Not on file   Transportation needs:    Medical: Not on file    Non-medical: Not on file  Tobacco Use   Smoking status: Never Smoker   Smokeless tobacco: Never Used  Substance and Sexual Activity   Alcohol use: Yes    Alcohol/week: 1.0 standard drinks    Types: 1 Cans of beer per week    Comment: daily   Drug use: No   Sexual activity: Not on file  Lifestyle   Physical activity:    Days per week: Not on file    Minutes per session: Not on file   Stress: Not on file  Relationships   Social connections:    Talks on phone: Not on file    Gets together: Not on file    Attends religious service: Not on file    Active member of club or organization: Not on file    Attends meetings of clubs or organizations: Not on file    Relationship status: Not on file   Intimate partner violence:    Fear of current or ex partner: Not on file    Emotionally abused: Not on file    Physically abused: Not on file    Forced sexual activity: Not on file  Other Topics Concern   Not on file  Social History Narrative   Lives in [place] with wife.  Exercises regularly - elliptical, play golf.    Family History  Problem Relation Age of Onset   Hypertension Mother    Thyroid disease Mother    Stroke Father    Coronary artery disease Father    Colon cancer Neg Hx    Esophageal cancer Neg Hx    Pancreatic cancer Neg Hx    Rectal cancer Neg Hx    Stomach cancer Neg Hx     Past Medical History:  Diagnosis Date   Allergy    Arthritis    Cancer ([place])    Cataract    Chronic kidney disease    atrophic left kidney   Coronary atherosclerosis    a. 2012 - incidentally noted  on CT scan of chest;  b. 08/2011 Myoview: normal; c. 03/2015 CPX @ [facility]: normal w/ PAC's, PVC's, and a 3 beat run of NSVT in recovery.   Heart murmur    asymptomatic function murmur.   History of echocardiogram    a. 12/2004 Echo: Nl EF, trace TR/MR.   Hx of radiation therapy    Hyperlipidemia     Personal history of malignant neoplasm of prostate    a. 2008 s/p prostatecomtomy-->radiation-->currently on Lupron injections.   Sinus bradycardia    Syncope    a. 12/2016    Past Surgical History:  Procedure Laterality Date   PROSTATECTOMY     ROBOT ASSISTED LAPAROSCOPIC RADICAL PROSTATECTOMY     SHOULDER SURGERY Right    SKIN GRAFT Left    TONSILLECTOMY      Current Outpatient Medications  Medication Sig Dispense Refill   aspirin EC 81 MG tablet Take 81 mg by mouth daily.     B Complex Vitamins (VITAMIN B COMPLEX PO) Take 1 tablet by mouth daily.     cholecalciferol (VITAMIN D) 1000 units tablet Take 1,000 Units by mouth daily.     ibuprofen (ADVIL,MOTRIN) 400 MG tablet Take 1 tablet (400 mg total) by mouth every 4 (four) hours as needed for mild pain or moderate pain. 30 tablet 0   leuprolide (LUPRON) 30 MG injection Inject 30 mg into the muscle See admin instructions. Inject 30 mg IM every 6 months     Misc Natural Products (OSTEO BI-FLEX ADV DOUBLE ST PO) Take 1 tablet by mouth daily.      zolpidem (AMBIEN) 10 MG tablet Take 10 mg by mouth at bedtime as needed for sleep.      Current Facility-Administered Medications  Medication Dose Route Frequency Provider Last Rate Last Dose   0.9 %  sodium chloride infusion  500 mL Intravenous Continuous Irene Shipper, Spencer Underwood        Allergies as of 12/16/2018 - Review Complete 12/08/2018  Allergen Reaction Noted   Atorvastatin  12/08/2018   Magnesium-containing compounds Itching 12/08/2018    Vitals: There were no vitals taken for this visit.   Last Weight:  Wt Readings from Last 1 Encounters:  04/29/17 162 lb (73.5 kg)   JJO:ACZYS is no height or weight on file to calculate BMI.      Last Height:   Ht Readings from Last 1 Encounters:  04/29/17 5\' 9"  (1.753 m)     Observations/Objective:   General: The patient is awake, alert and appears not in acute distress. The patient is well groomed. Head:  Normocephalic, atraumatic.  Neck I Normal ROM for shoulder , upper extremities, wrists and hands.  Mallampati 3,  neck circumference: 15"  Nasal airflow patent.  Respiratory: no tachypnoea. Can hold breath and count to xxx- deferred.   Trunk: BMI is 23.14   The patient's posture is erect.   Neurologic exam : The patient is awake and alert, oriented to place and time.    Attention span & concentration ability appears normal.  Speech is fluent,  without  dysarthria, dysphonia or aphasia.  Mood and affect are appropriate.  Cranial nerves: Pupils are equal  Facial motor strength is symmetric and tongue and uvula move midline. Shoulder shrug was symmetrical.   Motor exam:  symmetric muscle bulk and symmetric strength in upper extremities.  Coordination: reports no changes in penmanship.Rapid alternating movements in the fingers/hands was normal- without evidence of ataxia, dysmetria or tremor.  Gait and station: Patient  walks reportedly without assistive device.  Stance is stable and normal.      I reviewed the ER records, images and lab results.   Assessment and Plan: I discussed with [name] that nocturia can be related to obstructive sleep apnea as well as to his prostate history.  He feels that nocturia does not wake him up and that he just goes because he is already awake but the causes for the arousals may lay somewhere else.  There is a tingling dysesthesia, snoring supposedly as well the [place] he states, but there are longer breaks of his sleep pattern of fragmentation that reduces his sleep efficiency significantly.  And his need to take the occasional antihistamine to allow him to sleep better does morning me to.  While I would much prefer to have a face-to-face meeting in late May or June in the meantime I will settle for a home sleep test to rule out obstructive sleep apnea and or hypoxia.  The memory concerns should be addressed face-to-face with a [place] cognitive  assessment.   I will also discuss aside from possibly using Unisom doxylamine as a sleep aid which is less anti-cholinergic. Another approach to  therapy may be a trial of Trazodone- as an oral sleep aid.     Follow Up Instructions: HST ordered first, the patient may pick the device up [place] prefers getting some face to face instruction while driving up at [facility] [place] in 4-8 weeks with me, for formal MOCA.       I discussed the assessment and treatment plan with the patient. The patient was provided an opportunity to ask questions and all were answered. The patient agreed with the plan and demonstrated an understanding of the instructions.   The patient was advised to call back or seek an in-person evaluation if the symptoms worsen or if the condition fails to improve as anticipated.  I provided 45  minutes of non-face-to-face time during this encounter.    Larey Seat, Spencer Underwood 0/86/5784, 69:62 AM  Certified in Neurology by ABPN Certified in [place] by [facility] [address] [place], [place] [address]

## 2018-12-20 ENCOUNTER — Encounter: Payer: Self-pay | Admitting: Neurology

## 2018-12-20 DIAGNOSIS — R0683 Snoring: Secondary | ICD-10-CM | POA: Insufficient documentation

## 2018-12-20 DIAGNOSIS — R351 Nocturia: Secondary | ICD-10-CM | POA: Insufficient documentation

## 2018-12-20 DIAGNOSIS — H532 Diplopia: Secondary | ICD-10-CM | POA: Insufficient documentation

## 2018-12-20 DIAGNOSIS — G4700 Insomnia, unspecified: Secondary | ICD-10-CM | POA: Insufficient documentation

## 2018-12-20 DIAGNOSIS — R208 Other disturbances of skin sensation: Secondary | ICD-10-CM | POA: Insufficient documentation

## 2018-12-20 DIAGNOSIS — G3184 Mild cognitive impairment, so stated: Secondary | ICD-10-CM | POA: Insufficient documentation

## 2018-12-20 NOTE — Patient Instructions (Addendum)
Please remember to try to maintain good sleep hygiene, which means: Keep a regular sleep and wake schedule, try not to exercise or have a meal within 2 hours of your bedtime, and  try to keep your bedroom conducive for sleep, that is, cool and dark, without light distractors such as an illuminated alarm clock, and refrain from watching TV right before sleep or in the middle of the night-  and do not keep the TV or radio on during the night.  Also, try not to use or play on electronic devices at bedtime, such as your cell phone, tablet PC or laptop. If you like to read at bedtime on an electronic device, try to dim the background light as much as possible. Do not eat in the middle of the night.   We will request a home sleep study.    We will look for snoring or sleep apnea.   For chronic insomnia, you are best followed by a psychiatrist and/or sleep psychologist.   We will call you with the sleep study results and to make a follow up appointment.

## 2019-01-06 ENCOUNTER — Telehealth: Payer: Self-pay | Admitting: Neurology

## 2019-01-06 NOTE — Telephone Encounter (Signed)
Original Visit was scheduled for Wednesday but the computer connection did not work.    My RN rescheduled for the upcoming following Friday (17th) and that's when we connected and completed the visit.

## 2019-01-18 ENCOUNTER — Ambulatory Visit (INDEPENDENT_AMBULATORY_CARE_PROVIDER_SITE_OTHER): Payer: Medicare Other | Admitting: Neurology

## 2019-01-18 ENCOUNTER — Other Ambulatory Visit: Payer: Self-pay

## 2019-01-18 DIAGNOSIS — G4700 Insomnia, unspecified: Secondary | ICD-10-CM

## 2019-01-18 DIAGNOSIS — G4731 Primary central sleep apnea: Secondary | ICD-10-CM

## 2019-01-18 DIAGNOSIS — Z8249 Family history of ischemic heart disease and other diseases of the circulatory system: Secondary | ICD-10-CM

## 2019-01-18 DIAGNOSIS — G459 Transient cerebral ischemic attack, unspecified: Secondary | ICD-10-CM

## 2019-01-18 DIAGNOSIS — G4730 Sleep apnea, unspecified: Secondary | ICD-10-CM

## 2019-01-18 DIAGNOSIS — G478 Other sleep disorders: Secondary | ICD-10-CM

## 2019-01-18 DIAGNOSIS — R351 Nocturia: Secondary | ICD-10-CM

## 2019-01-18 DIAGNOSIS — G4733 Obstructive sleep apnea (adult) (pediatric): Secondary | ICD-10-CM | POA: Diagnosis not present

## 2019-01-18 DIAGNOSIS — R0683 Snoring: Secondary | ICD-10-CM

## 2019-01-18 DIAGNOSIS — G3184 Mild cognitive impairment, so stated: Secondary | ICD-10-CM

## 2019-01-18 DIAGNOSIS — Z8546 Personal history of malignant neoplasm of prostate: Secondary | ICD-10-CM

## 2019-01-18 DIAGNOSIS — H532 Diplopia: Secondary | ICD-10-CM

## 2019-01-18 DIAGNOSIS — R208 Other disturbances of skin sensation: Secondary | ICD-10-CM

## 2019-01-26 ENCOUNTER — Telehealth: Payer: Self-pay | Admitting: Neurology

## 2019-01-26 ENCOUNTER — Other Ambulatory Visit: Payer: Self-pay | Admitting: Internal Medicine

## 2019-01-26 ENCOUNTER — Encounter: Payer: Self-pay | Admitting: Neurology

## 2019-01-26 DIAGNOSIS — G459 Transient cerebral ischemic attack, unspecified: Secondary | ICD-10-CM | POA: Insufficient documentation

## 2019-01-26 NOTE — Telephone Encounter (Signed)
I called Mrs Spencer Underwood and told her about the sleep study result. This apnea is severe and not strictly OSA.  For this reason I will use a 30 day trial on auto- CPAP,if it doesn't reduce his AHI enough or makes the AHI worse, I will bring him into the lab for BiPAP or ASV titration, she took notes and will give the information to her husband.

## 2019-01-26 NOTE — Telephone Encounter (Signed)
Pt called wanting to know if his Sleep Study results have come in yet. Please advise.

## 2019-01-26 NOTE — Procedures (Signed)
Patient Information     First Name: Spencer Zeek Last Name: Underwood ID: [id number]  Birth Date: Sep 02, 1941 Age: 78 Gender: Male  Referring Provider: [name], MD BMI: 24.2 (W=163 lb, H=5' 9'')  Neck Circ.:  15 '' Epworth:  7/24  Sleep Study Information    Study Date: Jan 18, 2019 S/H/A Version: [id number] / 4.1.1528 / 76  History:  There are several concerns that Dr. Hessie Diener wanted to bring to my attention.  He considers himself a lifelong snorer he who has used bite guard and had a bilateral turbinate reduction and  jaw surgery all of which  have improved snoring .But over the years he has developed more fragmented sleep.  He himself has raised the question of apnea may be a cause.  In addition he has noted that he has some memory slips, and his eye doctor had noted changes in the left eye related to the development of an epi-retinal membrane which could be associated with untreated OSA.      Diagnosis:      Very severe Sleep Apnea, surprising for a patient of this BMI. AHI was 49.3/h and RDI was much higher, indicating the presence of very loud snoring.  Concerning is the NREM sleep predominance of apnea, indication of a complex or possible central form of sleep apnea rather than obstructive apnea which clusters in REM sleep.  There was no indication of hypoxia during sleep. There was bradycardia intermittently, lowest heart rate was 39 bpm.    Recommendations:     I will initiate CPAP by auto-titration, 5-16 cm water, 3 cm EPR, heated humidity and mask of patient's choice.  I request an early download of Dr. [name] Drohan's CPAP AHI in 30 days.  If he has central apneas he will need to come into the laboratory overnight for a PAP titration - may be BiPAP or ASV.     Electronically Signed: Larey Seat, MD 01-25-2019            Sleep Summary    Oxygen Saturation Statistics     Start Study Time: End Study Time: Total Recording Time:  10:58:59 PM  7:03:05 AM        8 h, 4 min  Total Sleep Time % REM of Sleep Time:  6 h, 34 min  11.4    Mean: 95 Minimum: 91 Maximum: 100  Mean of Desaturations Nadirs (%):   93  Oxygen Desaturation. %:   4-9 10-20 >20 Total  Events Number Total    67  1 98.5 1.5  0 0.0  68 100.0  Oxygen Saturation: <90 <=88 <85 <80 <70  Duration (minutes): Sleep % 0.0 0.0  0.0 0.0  0.0 0.0 0.0 0.0 0.0 0.0     Respiratory Indices      Total Events REM NREM All Night  pRDI:  432  pAHI:  313 ODI:  68  pAHIc:  59  % CSR: 0.0 23.2 20.5 6.8 0.0 73.9 53.1 11.2 10.5 68.1 49.3 10.7 9.3       Pulse Rate Statistics during Sleep (BPM)      Mean: 48 Minimum: 39 Maximum: 105    Indices are calculated using technically valid sleep time of  6 hrs, 20 min. pRDI/pAHI are calculated using oxi desaturations ? 3%  Body Position Statistics  Position Supine Prone Right Left Non-Supine  Sleep (min) 34.5 15.6 206.5 137.5 359.6  Sleep % 8.8 4.0 52.4 34.9 91.2  pRDI 92.5 31.1 70.2 63.2  65.8  pAHI 75.9 19.5 59.4 31.4 46.8  ODI 16.7 7.8 16.0 1.8 10.2     Snoring Statistics Snoring Level (dB) >40 >50 >60 >70 >80 >Threshold (45)  Sleep (min) 100.3 16.3 3.3 0.4 0.0 29.9  Sleep % 25.4 4.1 0.8 0.1 0.0 7.6    Mean: 41 dB Sleep Stages Chart                                                                           pAHI=49.3                                                                                       Mild              Moderate                    Severe                                                 5              15                    30

## 2019-01-26 NOTE — Addendum Note (Signed)
Addended by: Larey Seat on: 01/26/2019 03:09 PM   Modules accepted: Orders

## 2019-01-26 NOTE — Telephone Encounter (Signed)
-----   Message from Larey Seat, MD sent at 01/26/2019  3:09 PM EDT ----- Very severe Sleep Apnea, surprising for a patient of this BMI. AHI was 49.3/h and RDI was much higher, indicating the presence of very loud snoring.  Concerning is the NREM sleep predominance of apnea, indication of a complex or possible central form of sleep apnea rather than obstructive apnea which clusters in REM sleep.  There was no indication of hypoxia during sleep. There was bradycardia intermittently, lowest heart rate was 39 bpm.    Recommendations for Dr. [name] Medici's after HST care:    I will initiate CPAP by auto-titration, 5-16 cm water, 3 cm EPR, heated humidity and mask of patient's choice.  I request an early download of Dr. [name] Plack's CPAP AHI in 30 days.  If he has central apneas he will need to come into the laboratory overnight for a PAP titration - may be BiPAP or ASV.     Electronically Signed: Larey Seat, MD 01-25-2019

## 2019-01-26 NOTE — Telephone Encounter (Signed)
Dr. Elam Dutch study is read and abnormal , Please see result tab.

## 2019-01-26 NOTE — Telephone Encounter (Signed)
I called pt. I advised pt that Dr. Brett Fairy reviewed their sleep study results and found that pt has severe sleep apnea. Dr. Brett Fairy recommends that pt starts auto CPAP. I reviewed PAP compliance expectations with the pt. Pt is agreeable to starting a CPAP. I advised pt that an order will be sent to a DME, [facility], and [facility] will call the pt within about one week after they file with the pt's insurance. [facility] will show the pt how to use the machine, fit for masks, and troubleshoot the CPAP if needed. A follow up appt will need to be made for insurance purposes with Dr. Brett Fairy or NP 31-90 days after starting the machine. Pt states that he will call to schedule apt once he starts the machine Pt verbalized understanding to arrive 15 minutes early and bring their CPAP. A letter with all of this information in it will be mailed to the pt as a reminder. I verified with the pt that the address we have on file is correct. Pt verbalized understanding of results. Pt had no questions at this time but was encouraged to call back if questions arise. I have sent the order to [facility] and have received confirmation that they have received the order.

## 2019-01-26 NOTE — Telephone Encounter (Signed)
The sleep study was completed on 01/18/19, not sure when downloaded. Dr Brett Fairy has not read the result yet and is off this week I will contact The patient once I have the results so that I can review with him the results. I have sent a mychart message to the patient but will inform Dr Brett Fairy the patient is asking in case she comes in and wants to read his.

## 2019-01-27 ENCOUNTER — Other Ambulatory Visit: Payer: Self-pay

## 2019-01-27 ENCOUNTER — Ambulatory Visit
Admission: RE | Admit: 2019-01-27 | Discharge: 2019-01-27 | Disposition: A | Discharge status: Home / Self Care | Payer: Medicare Other | Source: Ambulatory Visit | Attending: Internal Medicine | Admitting: Internal Medicine

## 2019-01-27 ENCOUNTER — Other Ambulatory Visit: Payer: Self-pay | Admitting: Internal Medicine

## 2019-01-27 DIAGNOSIS — R4189 Other symptoms and signs involving cognitive functions and awareness: Secondary | ICD-10-CM

## 2019-01-27 DIAGNOSIS — R410 Disorientation, unspecified: Secondary | ICD-10-CM

## 2019-01-27 DIAGNOSIS — G459 Transient cerebral ischemic attack, unspecified: Secondary | ICD-10-CM

## 2019-01-27 DIAGNOSIS — I6782 Cerebral ischemia: Secondary | ICD-10-CM | POA: Diagnosis not present

## 2019-01-28 NOTE — Telephone Encounter (Signed)
Pt has called back to inform RN Myriam Jacobson that he tried calling [facility] and was told they have no record of him.  He was told they will research further and call him back.  Pt made aware the office is not open on Friday's at this time. He would like a call back next week as a result of what he was told by [facility]

## 2019-01-31 ENCOUNTER — Emergency Department (HOSPITAL_COMMUNITY): Payer: Medicare Other

## 2019-01-31 ENCOUNTER — Other Ambulatory Visit: Payer: Self-pay

## 2019-01-31 ENCOUNTER — Encounter (HOSPITAL_COMMUNITY): Payer: Self-pay | Admitting: Emergency Medicine

## 2019-01-31 ENCOUNTER — Encounter: Payer: Self-pay | Admitting: Neurology

## 2019-01-31 ENCOUNTER — Emergency Department (HOSPITAL_COMMUNITY)
Admission: EM | Admit: 2019-01-31 | Discharge: 2019-01-31 | Disposition: A | Discharge status: Home / Self Care | Payer: Medicare Other | Attending: Emergency Medicine | Admitting: Emergency Medicine

## 2019-01-31 DIAGNOSIS — I2510 Atherosclerotic heart disease of native coronary artery without angina pectoris: Secondary | ICD-10-CM | POA: Diagnosis not present

## 2019-01-31 DIAGNOSIS — R404 Transient alteration of awareness: Secondary | ICD-10-CM | POA: Diagnosis not present

## 2019-01-31 DIAGNOSIS — R4182 Altered mental status, unspecified: Secondary | ICD-10-CM | POA: Diagnosis present

## 2019-01-31 DIAGNOSIS — Z79899 Other long term (current) drug therapy: Secondary | ICD-10-CM | POA: Insufficient documentation

## 2019-01-31 DIAGNOSIS — R202 Paresthesia of skin: Secondary | ICD-10-CM | POA: Diagnosis not present

## 2019-01-31 DIAGNOSIS — R410 Disorientation, unspecified: Secondary | ICD-10-CM | POA: Diagnosis not present

## 2019-01-31 DIAGNOSIS — R531 Weakness: Secondary | ICD-10-CM | POA: Diagnosis not present

## 2019-01-31 DIAGNOSIS — Z7982 Long term (current) use of aspirin: Secondary | ICD-10-CM | POA: Diagnosis not present

## 2019-01-31 DIAGNOSIS — F0390 Unspecified dementia without behavioral disturbance: Secondary | ICD-10-CM

## 2019-01-31 LAB — COMPREHENSIVE METABOLIC PANEL
ALT: 16 U/L (ref 0–44)
AST: 28 U/L (ref 15–41)
Albumin: 4.3 g/dL (ref 3.5–5.0)
Alkaline Phosphatase: 54 U/L (ref 38–126)
Anion gap: 10 (ref 5–15)
BUN: 14 mg/dL (ref 8–23)
CO2: 28 mmol/L (ref 22–32)
Calcium: 9.7 mg/dL (ref 8.9–10.3)
Chloride: 96 mmol/L — ABNORMAL LOW (ref 98–111)
Creatinine, Ser: 0.96 mg/dL (ref 0.61–1.24)
GFR calc Af Amer: >60 mL/min (ref >60)
GFR calc non Af Amer: >60 mL/min (ref >60)
Glucose, Bld: 127 mg/dL — ABNORMAL HIGH (ref 70–99)
Potassium: 4 mmol/L (ref 3.5–5.1)
Sodium: 134 mmol/L — ABNORMAL LOW (ref 135–145)
Total Bilirubin: 0.9 mg/dL (ref 0.3–1.2)
Total Protein: 7.1 g/dL (ref 6.5–8.1)

## 2019-01-31 LAB — POCT I-STAT EG7
Bicarbonate: 27.4 mmol/L (ref 20.0–28.0)
Calcium, Ion: 1.23 mmol/L (ref 1.15–1.40)
HCT: 45 % (ref 39.0–52.0)
Hemoglobin: 15.3 g/dL (ref 13.0–17.0)
O2 Saturation: 86 %
Potassium: 3.9 mmol/L (ref 3.5–5.1)
Sodium: 135 mmol/L (ref 135–145)
TCO2: 29 mmol/L (ref 22–32)
pCO2, Ven: 55.1 mmHg (ref 44.0–60.0)
pH, Ven: 7.305 (ref 7.250–7.430)
pO2, Ven: 58 mmHg — ABNORMAL HIGH (ref 32.0–45.0)

## 2019-01-31 LAB — CBC WITH DIFFERENTIAL/PLATELET
Abs Immature Granulocytes: 0.01 10*3/uL (ref 0.00–0.07)
Basophils Absolute: 0.1 10*3/uL (ref 0.0–0.1)
Basophils Relative: 2 %
Eosinophils Absolute: 0.2 10*3/uL (ref 0.0–0.5)
Eosinophils Relative: 6 %
HCT: 44 % (ref 39.0–52.0)
Hemoglobin: 14.9 g/dL (ref 13.0–17.0)
Immature Granulocytes: 0 %
Lymphocytes Relative: 21 %
Lymphs Abs: 0.9 10*3/uL (ref 0.7–4.0)
MCH: 31 pg (ref 26.0–34.0)
MCHC: 33.9 g/dL (ref 30.0–36.0)
MCV: 91.5 fL (ref 80.0–100.0)
Monocytes Absolute: 0.4 10*3/uL (ref 0.1–1.0)
Monocytes Relative: 9 %
Neutro Abs: 2.8 10*3/uL (ref 1.7–7.7)
Neutrophils Relative %: 62 %
Platelets: 252 10*3/uL (ref 150–400)
RBC: 4.81 MIL/uL (ref 4.22–5.81)
RDW: 12.9 % (ref 11.5–15.5)
WBC: 4.4 10*3/uL (ref 4.0–10.5)
nRBC: 0 % (ref 0.0–0.2)

## 2019-01-31 MED ORDER — GADOBUTROL 1 MMOL/ML IV SOLN
7.0000 mL | Freq: Once | INTRAVENOUS | Status: AC | PRN
  Administered 2019-01-31: 12:00:00 7 mL via INTRAVENOUS

## 2019-01-31 MED ORDER — LORAZEPAM 2 MG/ML IJ SOLN
0.5000 mg | Freq: Once | INTRAMUSCULAR | Status: AC
  Administered 2019-01-31: 11:00:00 0.5 mg via INTRAVENOUS
  Filled 2019-01-31: qty 1

## 2019-01-31 NOTE — ED Notes (Signed)
Patient transported to MRI 

## 2019-01-31 NOTE — Discharge Instructions (Signed)
You were seen in the emergency department today with clumsiness of the left hand and change in mental status.  The neurologist has seen you and reviewed your MRI tests.  No further testing here today.  Please follow with your primary care doctor and the neurology group listed for further evaluation.  Return to the emergency department with any new or suddenly worsening symptoms.

## 2019-01-31 NOTE — Consult Note (Signed)
NEURO HOSPITALIST CONSULT NOTE   Requestig physician: Dr. 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 His wife states that his memory has been somewhat impaired over the last year, but that this worsened over the past 3 months, during the quarantine, during which he has spent much of his time indoors. He also has had increasing difficulty with visuospatial function, at times being unsure whether to turn left or right when trying to get to a certain room in his house, with similar episodes of disorientation when walking in the neighborhood or driving. His wife states that he has started having difficulty remembering some events that occurred within the past year or so. No language dysfunction. No dyscalculia. No behavioral changes. He was recently diagnosed with severe OSA and has been prescribed with a CPAP, which is arriving on Wednesday.   Past Medical History:  Diagnosis Date   Allergy    Arthritis    Cancer ([place])    Cataract    Chronic kidney disease    atrophic left kidney   Coronary atherosclerosis    a. 2012 - incidentally noted on CT scan of chest;  b. 08/2011 Myoview: normal; c. 03/2015 CPX @ [facility]: normal w/ PAC's, PVC's, and a 3 beat run of NSVT in recovery.   Heart murmur    asymptomatic function murmur.   History of echocardiogram    a. 12/2004 Echo: Nl EF, trace TR/MR.   Hx of radiation therapy    Hyperlipidemia    Personal history of malignant neoplasm of prostate    a. 2008 s/p  prostatecomtomy-->radiation-->currently on Lupron injections.   Sinus bradycardia    Syncope    a. 12/2016    Past Surgical History:  Procedure Laterality Date   PROSTATECTOMY     ROBOT ASSISTED LAPAROSCOPIC RADICAL PROSTATECTOMY     SHOULDER SURGERY Right    SKIN GRAFT Left    TONSILLECTOMY      Family History  Problem Relation Age of Onset   Hypertension Mother    Thyroid disease Mother    Stroke Father    Coronary artery disease Father    Colon cancer Neg Hx    Esophageal cancer Neg Hx    Pancreatic cancer Neg Hx  Rectal cancer Neg Hx    Stomach cancer Neg Hx               Social History:  reports that he has never smoked. He has never used smokeless tobacco. He reports current alcohol use of about 1.0 standard drinks of alcohol per week. He reports that he does not use drugs.  Allergies  Allergen Reactions   Atorvastatin     confusion    HOME MEDICATIONS:                                                                                                                        ROS:                                                                                                                                       As per HPI. All other systems negative.   Blood pressure (!) 156/94, pulse 67, temperature 97.9 F (36.6 C), temperature source Oral, resp. rate 14, height 5' 10"  (1.778 m), weight 73.5 kg, SpO2 98 %.   General Examination:                                                                                                       Physical Exam  HEENT-  [place]/AT    Lungs- Respirations unlabored Extremities- Warm and well perfused. No edema.  Neurological Examination Mental Status: Awake and alert. Mildly decreased attention. Speech fluent with intact comprehension, naming and repetition. Able to follow a directional 3 step command correctly, but somewhat slowed. Also with somewhat increased latencies of verbal responses to several questions.  Memory testing reveals normal registration of 3/3 items, but able to recall only one after a 5 minute delay without a cue. With a cue, he remembers a second object. He is unable to remember the third object even with a cue. 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 Visuospatial impairment is apparent when patient's facemask strap becomes looped around one arm of his eyeglasses, with patient unable to remove/unloop the mask from the glasses, requiring examiner's help. Affect is flattened with decreased prosodic content of speech as well. He is pleasant and cooperative.  Cranial Nerves: II: Visual field testing reveals a left homonymous hemianopsia. PERRL.  III,IV, VI: No ptosis. EOMI; however, saccadic quality to his visual pursuits is noted. No nystagmus.  V,VII: Smile symmetric, facial temp sensation equal bilaterally VIII: hearing intact to conversation IX,X: No hypophonia XI: Symmetric shouder shrug XII: midline tongue extension Motor: Right : Upper extremity   5/5    Left:     Upper extremity   5/5  Lower extremity   5/5     Lower extremity   5/5 Normal tone throughout; no cogwheeling or clasp knife rigidity; no atrophy noted No asymmetry.  No pronator drift.  Sensory: Temp and light touch intact BUE and BLE without extinction Deep Tendon Reflexes: 2+ and symmetric throughout, including achilles bilaterally. Negative Hoffman's bilaterally.  Plantars: Right: downgoing   Left: downgoing Cerebellar: No ataxia with FNF and H-S bilaterally Gait: Deferred   Lab Results: Basic Metabolic Panel: Recent Labs  Lab 01/31/19 0928 01/31/19 1018  NA 134* 135  K 4.0 3.9  CL 96*  --   CO2 28  --   GLUCOSE 127*  --   BUN 14  --   CREATININE 0.96  --   CALCIUM 9.7  --     CBC: Recent Labs  Lab 01/31/19 0928 01/31/19 1018  WBC 4.4  --   NEUTROABS 2.8  --   HGB 14.9 15.3  HCT  44.0 45.0  MCV 91.5  --   PLT 252  --     Cardiac Enzymes: No results for input(s): CKTOTAL, CKMB, CKMBINDEX, TROPONINI in the last 168 hours.  Lipid Panel: No results for input(s): CHOL, TRIG, HDL, CHOLHDL, VLDL, LDLCALC in the last 168 hours.  Imaging: Mr Virgel Paling ZY Contrast  Result Date: 01/31/2019 CLINICAL DATA:  Resolution of confusion and left arm numbness and tingling. EXAM: MRI HEAD WITHOUT AND WITH CONTRAST MRA HEAD WITHOUT CONTRAST TECHNIQUE: Multiplanar, multiecho pulse sequences of the brain and surrounding structures were obtained without and with intravenous contrast. Angiographic images of the head were obtained using MRA technique without contrast. CONTRAST:  7 cc Gadavist COMPARISON:  01/27/2019.  09/17/2018. FINDINGS: MRI HEAD FINDINGS Brain: Diffusion imaging does not show any acute or subacute infarction. There is generalized brain atrophy. No focal insult is seen affecting the brainstem or cerebellum. Cerebral hemispheres show mild to moderate chronic small-vessel ischemic changes of the deep and subcortical white matter. No cortical insult. No mass lesion, hemorrhage, hydrocephalus or extra-axial collection. After contrast administration, no abnormal enhancement occurs. Vascular: Major vessels at the base of the brain show flow. Skull and upper cervical spine: Negative Sinuses/Orbits: Clear/normal Other: None MRA HEAD FINDINGS Both internal carotid arteries are widely patent into the brain. No siphon stenosis. The anterior and middle cerebral vessels are patent without proximal stenosis, aneurysm or vascular malformation. Both vertebral arteries are widely patent to the basilar. No basilar stenosis. Posterior circulation branch vessels appear normal. Left PCA receives most of it supply from the anterior circulation. IMPRESSION: No change. Mild to moderate chronic small-vessel ischemic changes of the cerebral hemispheric white matter. No acute or subacute insult. Negative  intracranial MR angiography of the large and medium size vessels. Electronically Signed   By: Elta Guadeloupe  Shogry M.D.   On: 01/31/2019 12:02   Mr Jeri Cos And Wo Contrast  Result Date: 01/31/2019 CLINICAL DATA:  Resolution of confusion and left arm numbness and tingling. EXAM: MRI HEAD WITHOUT AND WITH CONTRAST MRA HEAD WITHOUT CONTRAST TECHNIQUE: Multiplanar, multiecho pulse sequences of the brain and surrounding structures were obtained without and with intravenous contrast. Angiographic images of the head were obtained using MRA technique without contrast. CONTRAST:  7 cc Gadavist COMPARISON:  01/27/2019.  09/17/2018. FINDINGS: MRI HEAD FINDINGS Brain: Diffusion imaging does not show any acute or subacute infarction. There is generalized brain atrophy. No focal insult is seen affecting the brainstem or cerebellum. Cerebral hemispheres show mild to moderate chronic small-vessel ischemic changes of the deep and subcortical white matter. No cortical insult. No mass lesion, hemorrhage, hydrocephalus or extra-axial collection. After contrast administration, no abnormal enhancement occurs. Vascular: Major vessels at the base of the brain show flow. Skull and upper cervical spine: Negative Sinuses/Orbits: Clear/normal Other: None MRA HEAD FINDINGS Both internal carotid arteries are widely patent into the brain. No siphon stenosis. The anterior and middle cerebral vessels are patent without proximal stenosis, aneurysm or vascular malformation. Both vertebral arteries are widely patent to the basilar. No basilar stenosis. Posterior circulation branch vessels appear normal. Left PCA receives most of it supply from the anterior circulation. IMPRESSION: No change. Mild to moderate chronic small-vessel ischemic changes of the cerebral hemispheric white matter. No acute or subacute insult. Negative intracranial MR angiography of the large and medium size vessels. Electronically Signed   By: Nelson Chimes M.D.   On: 01/31/2019  12:02    Assessment: 78 year old male with 3 month history of worsening memory and visuospatial function, on an approximately 1 year history of mild memory impairment 1. Exam and history are consistent with impairment in 2 cognitive domains: Memory and visuospatial. 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 The fluctuations in his cognition as well as flattened affect suggest possible Lewy body dementia. However, the visuospatial dysfunction would be more typical of Alzheimer dementia. His MRI does not appear consistent with a multiinfarct dementia. No language or behavioral changes to suggest FTLD.  2. There is a possibility that his severe sleep apnea is contributing to his cognitive changes, for two reasons, increased daytime sleepiness and possible residual effects during the day of night-time hypoxia or hypercarbia. He will have his CPAP device delivered on Wednesday. He has been encouraged to begin titrating the settings on his CPAP as soon as possible.  3. Transient left hand numbness. Repeat MRI brain today is negative for acute infarction and is unchanged from the MRI obtained on 5/27.  4. Left homonymous hemianopsia. This is unusual to find on exam without a corresponding lesion on MRI. This should be reassessed at Neurology follow up. Until then, he has been advised not to drive.  5. AST and ALT are normal. Venous blood gas unremarkable. Lytes unremarkable.   Recommendations: 1. CPAP recommendations as above.  2. He should follow up with his PCP Dr. [name] within 2-3 weeks. This note is being forwarded to [name] via Epic routing.  3. He should be evaluated by a dementia specialist at either [facility] or [facility] within 3-6 weeks.  4. If no improvement to cognition after 2 weeks of optimized CPAP, a trial of Aricept 5 mg qhs should be considered.  5. Avoid sedating medications.  6. Will need TSH, B12 level, B1 level, vitamin D  level,  ESR, C-reactive protein and ammonia.   A total of 60 minutes was spent in the evaluation of this patient. > 50% of time was spent providing education regarding the possible etiologies for his cognitive impairment to the patient and his wife. Electronically signed: Dr. Kerney Elbe 01/31/2019, 4:29 PM

## 2019-01-31 NOTE — ED Triage Notes (Signed)
Pt in with c/o confusion and L arm numbness tingling, now resolved. Wife states pt went to bed normal, but woke at 0745 with these symptoms. MAE's equally, grip strength equal bilaterally

## 2019-01-31 NOTE — ED Notes (Signed)
Pt verbalized understanding of discharge paperwork and follow-up care.  °

## 2019-01-31 NOTE — ED Notes (Signed)
Pt given turkey sandwich and diet coke  

## 2019-01-31 NOTE — ED Provider Notes (Signed)
Blood pressure (!) 130/94, pulse 67, temperature 97.9 F (36.6 C), temperature source Oral, resp. rate 18, height 5\' 10"  (1.778 m), weight 73.5 kg, SpO2 98 %.  Assuming care from Dr. Maryan Rued.  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  Refer to the original H&P for additional details.  05:00 PM  Reviewed the patient's work-up in the emergency department.  Spoke with neurology who has seen the patient.  No further recommendations here.  Recommend patient follow with his PCP and neurology for further testing as an outpatient including EEG.  See neurology consult note for additional recommendations as they apply.    Margette Fast, MD 01/31/19 [phone]

## 2019-01-31 NOTE — ED Notes (Addendum)
Pt requested update on status. Pt informed that we are waiting on neurology to come see him and he is with a critical patient at the time.

## 2019-01-31 NOTE — ED Notes (Signed)
Pt's wife updated on phone

## 2019-01-31 NOTE — ED Provider Notes (Signed)
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     The history is provided by the patient.  Altered Mental Status  Presenting symptoms comment:  Memory problems and some difficulty with coordination with his left arm.  Patient states when he woke up this morning he having some difficulty remembering specific things such as what his beach home looked like and certain other details of things but did know who he was where he was but states he has had intermittent issues with his memory since January.  However then he went to the bathroom approximately 20 minutes prior to arrival and noted that his left hand felt clumsy.  He was trying to turn on the faucet but kept grabbing the wrong thing and felt slight tingling in his left hand.  He felt that it only lasted for seconds and he is back to normal now and denies any issues with his lower extremities.  He does not have headache, neck pain and takes no medications regularly other than OTC meds.  He drinks 1 glass of wine an evening but does not use drugs or smoke cigarettes. Severity:  Moderate Most recent episode:  Today Episode history:  Single Timing:  Constant Progression:  Resolved Chronicity:  New Associated symptoms: weakness   Associated symptoms: no abdominal pain, no decreased appetite, no fever, no headaches, no light-headedness, no nausea, no visual change and no vomiting     Past Medical History:  Diagnosis Date   Allergy    Arthritis    Cancer ([employer])    Cataract    Chronic kidney disease    atrophic left kidney   Coronary atherosclerosis    a. 2012 - incidentally noted on CT scan of chest;  b. 08/2011 Myoview: normal; c. 03/2015 CPX @ [facility]: normal w/ PAC's, PVC's, and a 3  beat run of NSVT in recovery.   Heart murmur    asymptomatic function murmur.   History of echocardiogram    a. 12/2004 Echo: Nl EF, trace TR/MR.   Hx of radiation therapy    Hyperlipidemia    Personal history of malignant neoplasm of prostate    a. 2008 s/p prostatecomtomy-->radiation-->currently on Lupron injections.   Sinus bradycardia    Syncope    a. 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   Snoring 12/20/2018   Nocturia more than twice per night 12/20/2018   Insomnia disorder, with other sleep disorder, recurrent 12/20/2018   Arthritis    Cancer ([place])    Elevated troponin    Syncope 12/26/2016   Family history of early CAD 12/26/2016   Hyperlipidemia 08/15/2011   PERSONAL HISTORY MALIGNANT NEOPLASM PROSTATE 07/11/2010    Past Surgical History:  Procedure Laterality Date   PROSTATECTOMY     ROBOT ASSISTED LAPAROSCOPIC RADICAL PROSTATECTOMY     SHOULDER SURGERY Right    SKIN GRAFT Left    TONSILLECTOMY          Home Medications    Prior to Admission medications  Medication Sig Start Date End Date Taking? Authorizing Provider  aspirin EC 81 MG tablet Take 81 mg by mouth daily.    [provider]  B Complex Vitamins (VITAMIN B COMPLEX PO) Take 1 tablet by mouth daily.    [provider]  cholecalciferol (VITAMIN D) 1000 units tablet Take 1,000 Units by mouth daily.    [provider]  ibuprofen (ADVIL,MOTRIN) 400 MG tablet Take 1 tablet (400 mg total) by mouth every 4 (four) hours as needed for mild pain or moderate pain. 12/27/16   Jani Gravel, MD  leuprolide (LUPRON) 30 MG injection Inject 30 mg into the muscle See admin instructions. Inject 30 mg IM every 6 months    [provider]   Misc Natural Products (OSTEO BI-FLEX ADV DOUBLE ST PO) Take 1 tablet by mouth daily.     [provider]  zolpidem (AMBIEN) 10 MG tablet Take 10 mg by mouth at bedtime as needed for sleep.  07/19/11   [provider]    Family History Family History  Problem Relation Age of Onset   Hypertension Mother    Thyroid disease Mother    Stroke Father    Coronary artery disease Father    Colon cancer Neg Hx    Esophageal cancer Neg Hx    Pancreatic cancer Neg Hx    Rectal cancer Neg Hx    Stomach cancer Neg Hx     Social History Social History   Tobacco Use   Smoking status: Never Smoker   Smokeless tobacco: Never Used  Substance Use Topics   Alcohol use: Yes    Alcohol/week: 1.0 standard drinks    Types: 1 Cans of beer per week    Comment: daily   Drug use: No     Allergies   Atorvastatin and Magnesium-containing compounds   Review of Systems Review of Systems  Constitutional: Negative for decreased appetite and fever.  Gastrointestinal: Negative for abdominal pain, nausea and vomiting.  Neurological: Positive for weakness. Negative for light-headedness and headaches.  All other systems reviewed and are negative.    Physical Exam Updated Vital Signs BP (!) 162/89 (BP Location: Right Arm)    Pulse 62    Temp 97.9 F (36.6 C) (Oral)    Resp 18    Ht 5\' 10"  (1.778 m)    Wt 73.5 kg    SpO2 98%    BMI 23.25 kg/m   Physical Exam Vitals signs and nursing note reviewed.  Constitutional:      General: He is not in acute distress.    Appearance: He is well-developed.  HENT:     Head: Normocephalic and atraumatic.  Eyes:     Conjunctiva/sclera: Conjunctivae normal.     Pupils: Pupils are equal, round, and reactive to light.  Neck:     Musculoskeletal: Normal range of motion and neck supple.  Cardiovascular:     Rate and Rhythm: Normal rate and regular rhythm.     Heart sounds: No murmur.  Pulmonary:     Effort: Pulmonary effort is  normal. No respiratory distress.     Breath sounds: Normal breath sounds. No wheezing or rales.  Abdominal:     General: There is no distension.     Palpations: Abdomen is soft.     Tenderness: There is no abdominal tenderness. There is no guarding or rebound.  Musculoskeletal: Normal range of motion.        General: No tenderness.     Right  lower leg: No edema.     Left lower leg: No edema.  Skin:    General: Skin is warm and dry.     Findings: No erythema or rash.  Neurological:     Mental Status: He is alert and oriented to person, place, and time.     Comments: Patient states that he feels normal however he is having difficulty with coordination of his left arm.  He is having difficulty putting his mask on with the left side and seems to favor the right arm.  Strength is 5 out of 5 in bilateral upper extremities and no pronator drift present.  With rapid alternating hand movements slightly slower on the left.  5 out of 5 strength in bilateral lower extremities.  Sensation intact.  Speech is clear and appropriate.  Psychiatric:        Behavior: Behavior normal.      ED Treatments / Results  Labs (all labs ordered are listed, but only abnormal results are displayed) Labs Reviewed  COMPREHENSIVE METABOLIC PANEL - Abnormal; Notable for the following components:      Result Value   Sodium 134 (*)    Chloride 96 (*)    Glucose, Bld 127 (*)    All other components within normal limits  POCT I-STAT EG7 - Abnormal; Notable for the following components:   pO2, Ven 58.0 (*)    All other components within normal limits  CBC WITH DIFFERENTIAL/PLATELET  I-STAT VENOUS BLOOD GAS, ED    EKG None  Radiology Mr Jodene Nam Head Wo Contrast  Result Date: 01/31/2019 CLINICAL DATA:  Resolution of confusion and left arm numbness and tingling. EXAM: MRI HEAD WITHOUT AND WITH CONTRAST MRA HEAD WITHOUT CONTRAST TECHNIQUE: Multiplanar, multiecho pulse sequences of the brain and surrounding structures  were obtained without and with intravenous contrast. Angiographic images of the head were obtained using MRA technique without contrast. CONTRAST:  7 cc Gadavist COMPARISON:  01/27/2019.  09/17/2018. FINDINGS: MRI HEAD FINDINGS Brain: Diffusion imaging does not show any acute or subacute infarction. There is generalized brain atrophy. No focal insult is seen affecting the brainstem or cerebellum. Cerebral hemispheres show mild to moderate chronic small-vessel ischemic changes of the deep and subcortical white matter. No cortical insult. No mass lesion, hemorrhage, hydrocephalus or extra-axial collection. After contrast administration, no abnormal enhancement occurs. Vascular: Major vessels at the base of the brain show flow. Skull and upper cervical spine: Negative Sinuses/Orbits: Clear/normal Other: None MRA HEAD FINDINGS Both internal carotid arteries are widely patent into the brain. No siphon stenosis. The anterior and middle cerebral vessels are patent without proximal stenosis, aneurysm or vascular malformation. Both vertebral arteries are widely patent to the basilar. No basilar stenosis. Posterior circulation branch vessels appear normal. Left PCA receives most of it supply from the anterior circulation. IMPRESSION: No change. Mild to moderate chronic small-vessel ischemic changes of the cerebral hemispheric white matter. No acute or subacute insult. Negative intracranial MR angiography of the large and medium size vessels. Electronically Signed   By: Nelson Chimes M.D.   On: 01/31/2019 12:02   Mr Jeri Cos And Wo Contrast  Result Date: 01/31/2019 CLINICAL DATA:  Resolution of confusion and left arm numbness and tingling. EXAM: MRI HEAD WITHOUT AND WITH CONTRAST MRA HEAD WITHOUT CONTRAST TECHNIQUE: Multiplanar, multiecho pulse sequences of the brain and surrounding structures were obtained without and with intravenous contrast. Angiographic images of the head were obtained using MRA technique without  contrast. CONTRAST:  7 cc Gadavist COMPARISON:  01/27/2019.  09/17/2018. FINDINGS: MRI HEAD FINDINGS Brain: Diffusion imaging does not show any acute or subacute infarction. There is generalized brain atrophy. No focal insult is seen affecting the brainstem or cerebellum. Cerebral hemispheres show mild to moderate chronic small-vessel ischemic changes of the deep and subcortical white matter. No cortical insult. No mass lesion, hemorrhage, hydrocephalus or extra-axial collection. After contrast administration, no abnormal enhancement occurs. Vascular: Major vessels at the base of the brain show flow. Skull and upper cervical spine: Negative Sinuses/Orbits: Clear/normal Other: None MRA HEAD FINDINGS Both internal carotid arteries are widely patent into the brain. No siphon stenosis. The anterior and middle cerebral vessels are patent without proximal stenosis, aneurysm or vascular malformation. Both vertebral arteries are widely patent to the basilar. No basilar stenosis. Posterior circulation branch vessels appear normal. Left PCA receives most of it supply from the anterior circulation. IMPRESSION: No change. Mild to moderate chronic small-vessel ischemic changes of the cerebral hemispheric white matter. No acute or subacute insult. Negative intracranial MR angiography of the large and medium size vessels. Electronically Signed   By: Nelson Chimes M.D.   On: 01/31/2019 12:02    Procedures Procedures (including critical care time)  Medications Ordered in ED Medications - No data to display   Initial Impression / Assessment and Plan / ED Course  I have reviewed the triage vital signs and the nursing notes.  Pertinent labs & imaging results that were available during my care of the patient were reviewed by me and considered in my medical decision making (see chart for details).        Elderly male presenting today with symptoms concerning for possible code stroke.  He does complain of some memory  issues that have been present now since January.  He has had an outpatient work-up for this.  Initially started in January and was seen in the emergency room.  At that time he had an MRI/MRA of the brain which was negative.  He states his outpatient he had ultrasounds of the neck and an echo which was within normal limits.  However the memory issues have continued and he has been is seeking care with a neurologist as an outpatient.  He was diagnosed with severe sleep apnea and is supposed to get his CPAP mask on Tuesday.  However he just had an MRI done on 527 which showed no acute issues and he is referred to a [place] neurologist for further evaluation at [facility] for the ongoing memory issues.  Patient today had some coordination issues with his left hand and some tingling which he states he is never had before.  Was approximately 20 minutes prior to arrival.  He states that it is gone now however on my exam he is still slightly clumsy with the left hand and unable to put his mask over his ear and do fine motor task.  Patient is hypertensive at 162/89 but otherwise has normal vital signs.  He is denying any infectious symptoms.  Will discuss with neurology for further evaluation.  10:51 AM Patient's labs without acute findings and normal CO2 levels.  Patient is still having some clumsiness with his left hand and discussed with neurology and they felt it was important to do a repeat MRI and MRA and then will discuss once results return.  Patient remains stable and in no acute distress at this time.  He states he does need a little bit of Ativan to help him get through the MRI and this was  ordered.  3:27 PM MRI without acute finding.  MRA without acute finding.  Spoke with neurology who will come and evaluate the patient  Final Clinical Impressions(s) / ED Diagnoses   Final diagnoses:  None    ED Discharge Orders    None       Blanchie Dessert, MD 01/31/19 1528

## 2019-02-01 NOTE — Telephone Encounter (Signed)
Patient sent a mychart message stating that he has an apt on 02/03/19 with [facility]. Replied to the patient on mychart.

## 2019-02-02 ENCOUNTER — Encounter: Payer: Self-pay | Admitting: Neurology

## 2019-02-06 ENCOUNTER — Encounter: Payer: Self-pay | Admitting: Neurology

## 2019-02-06 ENCOUNTER — Other Ambulatory Visit: Payer: Medicare Other

## 2019-02-10 ENCOUNTER — Other Ambulatory Visit: Payer: Self-pay | Admitting: Neurology

## 2019-02-10 DIAGNOSIS — G4731 Primary central sleep apnea: Secondary | ICD-10-CM

## 2019-02-11 ENCOUNTER — Telehealth: Payer: Self-pay | Admitting: Neurology

## 2019-02-11 ENCOUNTER — Encounter: Payer: Self-pay | Admitting: Neurology

## 2019-02-11 NOTE — Telephone Encounter (Signed)
Patient called in late on tues evening to advise that he was having central events occurring under the Auto CPAP range. Advised the patient I would have Dr Dohmeier review data and get orders in for a auto Bipap for the patient.  Orders were sent to [facility] per Dr Edwena Felty instructions.  [facility] replied,"The appointment for 3:30 was cancelled because we did not have an order at the time it was made.  We can put him on a loaner bipap but his insurance will require he has a Bipap titration study. We have him rescheduled for 1:00 tomorrow.   Patient will keep his CPAP until BIPAP titration is completed and we are able to bill his insurance but we will provide a loaner bipap until that is done."  Pt insurance will require that he have a titration study completed before insurance can provide the patient with machine. Pt was made aware of this and was set up on [facility] with the auto bipap loaner from [place]

## 2019-02-11 NOTE — Telephone Encounter (Signed)
Pt and his wife called in today to inform me that with the auto Bipap on his first night the cental apnea was worse under Bipap treatment with 36 central events. Patient is requesting that we bring him in urgently to have his titration study completed. Advised the patient that I would note this but unfortunately due to COVID 19 we have not started the titration studies up in our sleep lab at this time. Advised that we are hoping to soon start this up but that we have several patients that have been waiting for titration studies to be completed. I advised the patient that I will make the sleep lab aware of the urgency so that we can possibly see him sooner. Patient's wife jumped on the phone and was adamant that her understanding is that this is considered a urgent matter and that with his central events worsening she feels that a call could be made on the patient's behalf to the insurance company explaining the urgency and need for the correct equipment. Advised the patient that I understand their frustration but insurance is not going to buck. They have the protocols that they require put in place for a reason. I advised how the process looks in that we have to show in a inlab study that the patient failed using the CPAP, BIPAP requiring the need for ASV. I stated that [facility] was very kind that they even offered the Auto Bipap free to the patient at no charge as a loaner until we could get the patient in for a titration study. Unfortunately they don't have the ASV machines they can offer because it is a expensive machine, again that will require insurance to cover. The wife states, "in the meantime what if my husband dies, I just find this unreasonable that it took him so long to get in with Dr Brett Fairy and then to have the study and now we are unsure at what point we can complete these other test." I advised once more I sympathize and understand the frustration. She then requested that Dr Dohmeier call because  she just would rather review this with her stating, "she was not trying to go above my head but that she needs to hear this from Dr Brett Fairy." I advised I completely understood and stated that Dr Brett Fairy will tell her the same thing that I am saying but that I would make her aware of their request for her to call and speak with them. She was appreciative and states she just wants to know what risks the pt is at in his health if we continue to wait. I advised I understood and informed her that I once again would make Dr Dohmeier aware. Informed her also that Dr Brett Fairy has a pretty busy and full schedule today and unsure at what point she will be able to call made them aware it may be tomorrow before they hear back, she requested if possible her call back today and I informed her I would make her aware.

## 2019-02-12 DIAGNOSIS — R419 Unspecified symptoms and signs involving cognitive functions and awareness: Secondary | ICD-10-CM | POA: Diagnosis not present

## 2019-02-14 ENCOUNTER — Encounter: Payer: Self-pay | Admitting: Neurology

## 2019-02-15 NOTE — Telephone Encounter (Signed)
I spoke to Dr Terance Hart who felt that the nightly sleep on automatic-BiPAP is more refreshing than usual and improved over his CPAP experience.  He slept over 7 hours and had only 1-2 nocturia breaks, instead of 4-5 which was still average on CPAP. Marland Kitchen  The same interface was used in both modalities but he has a higher air leak on BiPAP> The AHI is higher, and is consistent with central apneas.The central apnea AHI was over 25/h.  And was about 15 on CPAP.   Given he feels better I will leave him on the current settings until he can be admitted to the overnight ASV  Titration, now pending AASM guidelines for re opening.   I assured him that he is safe to use BiPAP until he returns , hopefully we can titrate him soon. He is encouraged to take his 14 day trip to the beach today and we will call him on his mobile should we have clearance for opening in that short time frame.   Larey Seat, MD

## 2019-03-02 ENCOUNTER — Encounter: Payer: Self-pay | Admitting: Neurology

## 2019-03-02 DIAGNOSIS — H5000 Unspecified esotropia: Secondary | ICD-10-CM | POA: Diagnosis not present

## 2019-03-02 DIAGNOSIS — H532 Diplopia: Secondary | ICD-10-CM | POA: Diagnosis not present

## 2019-03-02 DIAGNOSIS — H3500 Unspecified background retinopathy: Secondary | ICD-10-CM | POA: Diagnosis not present

## 2019-03-02 DIAGNOSIS — H35372 Puckering of macula, left eye: Secondary | ICD-10-CM | POA: Diagnosis not present

## 2019-03-02 NOTE — Telephone Encounter (Signed)
Pt is asking for a call from [place] re: his CPAP

## 2019-03-02 NOTE — Telephone Encounter (Signed)
yes

## 2019-03-09 ENCOUNTER — Ambulatory Visit: Payer: Self-pay | Admitting: Adult Health

## 2019-03-09 DIAGNOSIS — E7849 Other hyperlipidemia: Secondary | ICD-10-CM | POA: Diagnosis not present

## 2019-03-11 ENCOUNTER — Telehealth: Payer: Self-pay | Admitting: Neurology

## 2019-03-11 NOTE — Telephone Encounter (Signed)
Pt is asking for a call from Dr Brett Fairy to discuss his concerns about rising levels re: his sleep apnea, pt states he is over due for an at home sleep study, please call.

## 2019-03-11 NOTE — Telephone Encounter (Signed)
Pt has called to say thank you to Dr Brett Fairy and that he is aware of complications re: the rescheduling of his appointment but he will wait.  No call back requested

## 2019-03-11 NOTE — Telephone Encounter (Signed)
Patient is on the schedule for a CPAP titration study on 03/25/2019. I will print a download for Dr Brett Fairy to review for with the patient.

## 2019-03-11 NOTE — Telephone Encounter (Signed)
I left a voicemail for Dr Terance Hart- I am sorry that it will take longer than I anticipated and promised to get the attended PSG study -longer than I thought when we last spoke. We have all patients now COVID tested 3 days before the test. That also complicates filling openings on our schedule , as the wait listed patients can only resume the opened appointment when tested before.  Larey Seat, MD

## 2019-03-14 ENCOUNTER — Encounter: Payer: Self-pay | Admitting: Neurology

## 2019-03-16 DIAGNOSIS — H532 Diplopia: Secondary | ICD-10-CM | POA: Diagnosis not present

## 2019-03-16 DIAGNOSIS — H5000 Unspecified esotropia: Secondary | ICD-10-CM | POA: Diagnosis not present

## 2019-03-16 DIAGNOSIS — H21511 Anterior synechiae (iris), right eye: Secondary | ICD-10-CM | POA: Diagnosis not present

## 2019-03-17 DIAGNOSIS — C61 Malignant neoplasm of prostate: Secondary | ICD-10-CM | POA: Diagnosis not present

## 2019-03-22 ENCOUNTER — Other Ambulatory Visit (HOSPITAL_COMMUNITY)
Admission: RE | Admit: 2019-03-22 | Discharge: 2019-03-22 | Disposition: A | Discharge status: Home / Self Care | Payer: Medicare Other | Source: Ambulatory Visit | Attending: Neurology | Admitting: Neurology

## 2019-03-22 DIAGNOSIS — Z1159 Encounter for screening for other viral diseases: Secondary | ICD-10-CM | POA: Diagnosis not present

## 2019-03-22 LAB — SARS CORONAVIRUS 2 (TAT 6-24 HRS): SARS Coronavirus 2: NEGATIVE

## 2019-03-25 ENCOUNTER — Other Ambulatory Visit: Payer: Self-pay

## 2019-03-25 ENCOUNTER — Ambulatory Visit (INDEPENDENT_AMBULATORY_CARE_PROVIDER_SITE_OTHER): Payer: Medicare Other | Admitting: Neurology

## 2019-03-25 DIAGNOSIS — G4731 Primary central sleep apnea: Secondary | ICD-10-CM

## 2019-03-25 DIAGNOSIS — G4761 Periodic limb movement disorder: Secondary | ICD-10-CM

## 2019-03-25 DIAGNOSIS — Z8546 Personal history of malignant neoplasm of prostate: Secondary | ICD-10-CM

## 2019-03-25 DIAGNOSIS — R001 Bradycardia, unspecified: Secondary | ICD-10-CM

## 2019-03-25 DIAGNOSIS — R351 Nocturia: Secondary | ICD-10-CM

## 2019-03-26 DIAGNOSIS — G4731 Primary central sleep apnea: Secondary | ICD-10-CM | POA: Insufficient documentation

## 2019-03-26 NOTE — Addendum Note (Signed)
Addended by: Larey Seat on: 03/26/2019 10:57 AM   Modules accepted: Orders

## 2019-03-26 NOTE — Procedures (Signed)
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 He was seen in a video visit on 12-16-2018, then underwent a HST on 01-18-2019, revealing severe Sleep Apnea, surprising for a patient of this BMI. AHI was 49.3/h and RDI was much higher, indicating the presence of very loud snoring.  Concerning is the NREM sleep predominance of apnea, indication of a complex or possible central form of sleep apnea rather than obstructive apnea which clusters in REM sleep.  There was no indication of hypoxia during sleep. There was bradycardia intermittently, lowest heart rate was 39 bpm. Auto CPAP was initiated and did not reduce the AHI significantly enough, with residual AHIs of 17-25/h. Central apneas emerged.   The patient endorsed the Epworth Sleepiness Scale at 7 points  The patient's weight 163 pounds with a height of 69 (inches), resulting in a BMI of 24.2 kg/m2. The patient's neck circumference measured 15 inches.  CURRENT MEDICATIONS: Aspirin, Vitamin B, Vitamin D, Advil, Lupron, Osteo Bi-Flex    PROCEDURE:  This is a multichannel digital polysomnogram utilizing the SomnoStar 11.2 system.  Electrodes and sensors were applied and monitored per AASM Specifications.   EEG, EOG, Chin and Limb EMG, were sampled at 200 Hz.  ECG, Snore and Nasal Pressure, Thermal Airflow, Respiratory Effort, CPAP Flow and Pressure, Oximetry was sampled at 50 Hz. Digital video and audio were recorded.      BiPAP was initiated in an F 30i FFM at 10/5 cmH20 with heated humidity per AASM split night standards and pressure was advanced to 10/5 cm BiPAP with ST 10/min- the central apneas were decreasing to an AHI of 2.4/h. Another sight increase in pressure to BiPAP12/7 cmH20/ ST 10 further decreased central  apneas.  At a PAP pressure of 12/7cmH20/ST 10, there was a reduction of the AHI to 0.5 with improvement of sleep apnea.  Lights Out was at 00:02 and Lights On at 06:48. Total recording time (TRT) was 406 minutes, with a total sleep time (TST) of 342 minutes. The patient's sleep latency was 13 minutes. REM latency was 180.5 minutes.  The sleep efficiency was 84.2 %.    SLEEP ARCHITECTURE: WASO (Wake after sleep onset) was 59.5 minutes.  There were 9.5 minutes in Stage N1, 328 minutes Stage N2, 0 minutes Stage N3 and 4.5 minutes in Stage REM.  The percentage of Stage N1 was 2.8%, Stage N2 was 95.9%, Stage N3 was 0% and Stage R (REM sleep) was only 1.3%.   RESPIRATORY ANALYSIS:  There was a total of 42 respiratory events: 0 obstructive apneas, 40 central apneas and 2 hypopneas. The total APNEA/HYPOPNEA INDEX (AHI) was 7.4 /hour. 0 events occurred in REM sleep and 42 events in NREM. The REM AHI was 0 /hour versus a non-REM AHI of 7.5 /hour.  The patient spent 48 minutes of total sleep time in the supine position and 294 minutes in non-supine. The supine AHI was 16.3/h, versus a non-supine AHI of 5.9.  OXYGEN SATURATION & C02:  The baseline 02 saturation was 96%, with the lowest being 92%. Time spent below 89% saturation equaled 0 minutes. There was no hypoxia.  The arousals were noted as: 54 were spontaneous, 57 were associated with PLMs, and  9 were associated with respiratory events. The patient had a total of 849 Periodic Limb Movements. The Periodic Limb Movement (PLM) index was 148.9 and the PLM Arousal index was 10.0 /hour. The sleep architecture was notable for the low proportion of REM sleep and PLMs were noted to continue into REM sleep. The right leg EMG was the most frequently activated one. Audio and video analysis did not show any complex movements, behaviors, phonations or vocalizations.  The patient took two bathroom breaks. No Snoring was noted. EKG with PVCs and bradycardia, sinus  rhythm.  Post-study, the patient indicated that sleep was the same as usual.  The patient was fitted with a ResMed Medium size F30 i model FF-Mask.  DIAGNOSIS 1. 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 2. Supine sleep still exacerbated the AHI during titration, and the best control of central apnea was achieved in non-supine sleep.  3. 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  4. Non-specific EKG, bradycardia and only isolated PVCs.   PLANS/RECOMMENDATIONS: 1. Follow-up with Dr. Brett Fairy, MD.  2. BiPAP therapy compliance is defined as 4 hours or more of nightly use, try to avoid supine sleep. We send your order to [facility] on 03-26-2019 in AM.  3. Periodic Limb Movements were plenty and some caused arousals from sleep - if these are not related to symptoms of Restless legs they may indicate REM sleep behavior. A memory test and neurological exam should be performed to help with the differential diagnosis.  4. Any apnea patient should avoid hypnotics and alcoholic beverage consumption within 2 hours of bedtime.  A follow up appointment will be scheduled in the Sleep Clinic at [facility].   Please call [phone] with any questions.     I certify that I have reviewed the entire raw data recording prior to the issuance of this report in accordance with the Standards of Accreditation of the American Academy of Sleep Medicine (AASM)  Larey Seat, M.D. 03-26-2019 Diplomat, American Board of Psychiatry and Neurology  Diplomat, [facility] Medical Director, [place] [facility]

## 2019-03-29 ENCOUNTER — Other Ambulatory Visit: Payer: Self-pay

## 2019-03-29 ENCOUNTER — Ambulatory Visit (INDEPENDENT_AMBULATORY_CARE_PROVIDER_SITE_OTHER): Payer: Medicare Other | Admitting: Neurology

## 2019-03-29 ENCOUNTER — Encounter: Payer: Self-pay | Admitting: Neurology

## 2019-03-29 VITALS — BP 147/86 | HR 67 | Temp 98.1°F | Ht 69.5 in | Wt 157.0 lb

## 2019-03-29 DIAGNOSIS — G4731 Primary central sleep apnea: Secondary | ICD-10-CM

## 2019-03-29 DIAGNOSIS — G459 Transient cerebral ischemic attack, unspecified: Secondary | ICD-10-CM | POA: Diagnosis not present

## 2019-03-29 DIAGNOSIS — R0683 Snoring: Secondary | ICD-10-CM

## 2019-03-29 DIAGNOSIS — R351 Nocturia: Secondary | ICD-10-CM

## 2019-03-29 DIAGNOSIS — G3184 Mild cognitive impairment, so stated: Secondary | ICD-10-CM

## 2019-03-29 DIAGNOSIS — R208 Other disturbances of skin sensation: Secondary | ICD-10-CM

## 2019-03-29 DIAGNOSIS — H532 Diplopia: Secondary | ICD-10-CM | POA: Diagnosis not present

## 2019-03-29 NOTE — Progress Notes (Signed)
SLEEP MEDICINE CLINIC    Provider:  Larey Seat, [place]  Primary Care Physician:  Spencer Infante, Underwood [place] [address]     Referring Provider: Crist Underwood, [place],  [place] [id number]          Chief Complaint according to patient   Patient presents with:     New Patient (Initial Visit)           HISTORY OF PRESENT ILLNESS:  Spencer Underwood is a 78 y.o. year old White or Caucasian male patient seen herein a Rv following his virtual visit consultation-  referral on 03/29/2019 from Dr Spencer Underwood. Chief concern according to patient :  transient confusional episodes.    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 He  has a  has a past medical history of Allergy, Arthritis, Cancer ([place]), Cataract, Chronic kidney disease, Coronary atherosclerosis, Heart murmur, History of echocardiogram, radiation therapy, Hyperlipidemia, Personal history of malignant neoplasm of prostate, Sinus bradycardia, and Syncope..   I described in my virtual visit that since a trip to [place] in December / January-2020 there was a notable change in personality and cognitive capability that his family and especially his spouse have noted.  The patient describes his recent health history since April 2018 with a printed journal that his wife brought to the visit.  He had an upper respiratory infection in late April 2018 developed cervical neuralgia and woke out of sleep with this pain seated.  Had a very severe shooting pain up the right side of his head followed by a syncopal episode.  That night the couple went to the [facility] emergency room and he was undergoing cardiac work-up which remained negative.  He was suspected to have had a vasovagal episode may be in relation to pain.   Over the year 2019 there were a few times but an event was  mentioned from the prior year to Dr. Terance Underwood struggle to remember this was only noted and remarkable because he had such an exacting memory.   By late fall 2019 he started to use sometimes the wrong word for example he replaced the word car and truck.  Usually he corrected himself.  He had an onset of  transient double vision, in winter 2019 this began to occur daily and became more obvious but it did not impair his driving or daily activity.  It usually affected him when he was looking at a small object close up or at the person far away.  The family noted no changes in personality demeanor, his sense of humor, his memory and his ability to function.  The couple went on a trip to [place] in [place] between December 26 and September 12, 2018 and enjoyed the trip very much the journey home however took 30 hours with very little sleep and was therefore very taxing.   The following day, January 12, Dr. Terance Underwood awoke and noted a visual field change with the left side only.  Localized split image in the left visual field of the left eye was a diagnosis the episode lasted 10 minutes during which she could not read.  He saw Spencer Underwood who suspected that it may have been a TIA or painless migraine and started Spencer Underwood on 400 mg magnesium per day.  On January 16s he woke up in the  early morning with a 20-minute episode of confusion.  He had some trouble recalling names of friends his thinking was not congruent.  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  On January 17 he saw [name], his primary internist who also thought that it was a TIA versus migraine and prescribed baby aspirin daily.  Somewhere in that time he had an onset of itching at night causing him difficulty sleeping.  He tried antihistamines and tried Aveeno  lotion.  The itchiness remained for several months.  By February 2020 the couple left for a vacation of 3 weeks with friends  playing golf, walking, playing bridge -there was a lot of interaction stimulation. 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  For the residual weeks he showed some flat affect, was a little less talkative and less jovial than normal.  By March 2020 and back in [name] he had more episodes of transient difficulty with orientation.  This all occurred in well known locations or in route to well-known places. Dr. Terance Underwood decided to stop his statin medication. He also wondered if magnesium cause itching, and discontinued that supplement.  He continued to be confused on directions while driving before initiating a car ride.  He had a hard time to admission how to drive to another location.  Because of the call with pandemic and shutdown he had risen very little until now.  He drove 2 months from [name] to [name] without any difficulties on 13 April.  On 17 April he had our telemedicine appointment, which was meant to address sleep primarily.  The sleep study followed on Jan 18, 2019 and was performed by a home sleep test as the lab remained closed at the time.   He was found to have very severe apnea at an AHI of 49.3/h and a higher RDI indicating the presence of snoring.  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   In the meantime we wanted to see if CPAP might correct his apnea and he was given an auto titration capable CPAP device between 5 and 16 cmH2O pressure with 3 cm expiratory pressure relief and heated humidity.  He continued to have high apnea and hypopnea indices between AHI of 30 and 10/h.  The  patient drove to and from [name] for 3 hours each way without difficulty in late May 2 weekends in a row.  On 31 May he woke up with some confusion and transient numbness of the left hand went to the [facility] ED but had to go along.  EKG was unremarkable he was there for over 8 hours and MRI showed no change in the atrophy or small vessel ischemia from January and this time was obtained with contrast.   By 02-09-2019 he presented to the [facility] neurology and memory clinic and I have a copy of this consult available.     Before we could open the lab again I switched him to BiPAP with a maximum inspiratory pressure of 14 minimum  expiratory pressure of 4 and a pressure support of 5 cm but he remained with a high apnea index, all apneas arising under the therapy for central apneas again with an AHI of 17.3/h.  Also this AHI was lower than the baseline AHI it was not a treatment for the long-term as it was insufficient.  On 25 March 2019 he returned for a titration to positive airway pressure during which it was clearly documented that he failed CPAP but he did much better with BiPAP 12/7 cmH2O with a rescue breath setting at 10/min.  The machine was ordered but was not immediately available to the patient and actually was supposed to be delivered on Monday, 05 April 2019    Family medical Rachelle Hora history: No other family member on CPAP with OSA, dementia , or degenerative disorders.   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  Family status is married , with adult children,and  grandchildren. The patient currently likes to golf, travel , to drive.     He was then asked to return for a titration to positive airway pressure on 25 March 2019 he used an F 30 on a full facemask and be started at 10/5 cm water pressure.  The patient developed some central apneas but the number was actively decreasing to an AHI of only 2.4 at the final setting.  With an ST of 10 so-called  rescue breaths the patient at 12/7 cmH2O pressure reached a reduction of the AHI to 0.5/h.  Sleep efficiency was 84% which is very good for titration study, most of the arousals that still occurred during the study were actually related to spontaneous arousal or 2 limb movements.  It was notable that PLM's also continued into REM sleep rapid eye movement sleep.       Review of Systems: Out of a complete 14 system review, the patient complains of only the following symptoms, and all other reviewed systems are negative.:  Snoring, nocturia related fragmented sleep. apnea   How likely are you to doze in the following situations: 0 = not likely, 1 = slight chance, 2 = moderate chance, 3 = high chance   Sitting and Reading? Watching Television? Sitting inactive in a public place (theater or meeting)? As a passenger in a car for an hour without a break? Lying down in the afternoon when circumstances permit? Sitting and talking to someone? Sitting quietly after lunch without alcohol? In a car, while stopped for a few minutes in traffic?   Total = 6/ 24 points   FSS endorsed at 23/ 63 points.   Social History   Socioeconomic History   Marital status: Married    Spouse name: Not on file   Number of children: Not on file   Years of education: Not on file   Highest education level: Not on file  Occupational History   Occupation: Retired Engineer, drilling  Social Designer, fashion/clothing strain: Not on file   Food insecurity    Worry: Not on file    Inability: Not on file   Transportation needs    Medical: Not on file    Non-medical: Not on file  Tobacco Use   Smoking status: Never Smoker   Smokeless tobacco: Never Used  Substance and Sexual Activity   Alcohol use: Yes    Alcohol/week: 1.0 standard drinks    Types: 1 Cans of beer per week    Comment: daily  Drug use: No   Sexual activity: Not on file  Lifestyle   Physical activity    Days per week: Not on file     Minutes per session: Not on file   Stress: Not on file  Relationships   Social connections    Talks on phone: Not on file    Gets together: Not on file    Attends religious service: Not on file    Active member of club or organization: Not on file    Attends meetings of clubs or organizations: Not on file    Relationship status: Not on file  Other Topics Concern   Not on file  Social History Narrative   Lives in [place] with wife.  Exercises regularly - elliptical, play golf.    Family History  Problem Relation Age of Onset   Hypertension Mother    Thyroid disease Mother    Stroke Father    Coronary artery disease Father    Colon cancer Neg Hx    Esophageal cancer Neg Hx    Pancreatic cancer Neg Hx    Rectal cancer Neg Hx    Stomach cancer Neg Hx     Past Medical History:  Diagnosis Date   Allergy    Arthritis    Cancer ([place])    Cataract    Chronic kidney disease    atrophic left kidney   Coronary atherosclerosis    a. 2012 - incidentally noted on CT scan of chest;  b. 08/2011 Myoview: normal; c. 03/2015 CPX @ [facility]: normal w/ PAC's, PVC's, and a 3 beat run of NSVT in recovery.   Heart murmur    asymptomatic function murmur.   History of echocardiogram    a. 12/2004 Echo: Nl EF, trace TR/MR.   Hx of radiation therapy    Hyperlipidemia    Personal history of malignant neoplasm of prostate    a. 2008 s/p prostatecomtomy-->radiation-->currently on Lupron injections.   Sinus bradycardia    Syncope    a. 12/2016    Past Surgical History:  Procedure Laterality Date   PROSTATECTOMY     ROBOT ASSISTED LAPAROSCOPIC RADICAL PROSTATECTOMY     SHOULDER SURGERY Right    SKIN GRAFT Left    TONSILLECTOMY       Current Outpatient Medications on File Prior to Visit  Medication Sig Dispense Refill   aspirin EC 81 MG tablet Take 81 mg by mouth daily.     B Complex Vitamins (VITAMIN B COMPLEX PO) Take 1 tablet by mouth daily.     cholecalciferol  (VITAMIN D) 1000 units tablet Take 1,000 Units by mouth daily.     ezetimibe (ZETIA) 10 MG tablet Take 10 mg by mouth daily.     ibuprofen (ADVIL,MOTRIN) 400 MG tablet Take 1 tablet (400 mg total) by mouth every 4 (four) hours as needed for mild pain or moderate pain. 30 tablet 0   leuprolide (LUPRON) 30 MG injection Inject 30 mg into the muscle See admin instructions. Inject 30 mg IM every 6 months     Misc Natural Products (OSTEO BI-FLEX ADV DOUBLE ST PO) Take 1 tablet by mouth daily.      Current Facility-Administered Medications on File Prior to Visit  Medication Dose Route Frequency Provider Last Rate Last Dose   0.9 %  sodium chloride infusion  500 mL Intravenous Continuous Irene Shipper, Underwood        Allergies  Allergen Reactions   Atorvastatin     confusion  Physical exam:  Today's Vitals   03/29/19 1635  BP: (!) 147/86  Pulse: 67  Temp: 98.1 F (36.7 C)  Weight: 157 lb (71.2 kg)  Height: 5' 9.5" (1.765 m)   Body mass index is 22.85 kg/m.   Wt Readings from Last 3 Encounters:  03/29/19 157 lb (71.2 kg)  01/31/19 162 lb 0.6 oz (73.5 kg)  04/29/17 162 lb (73.5 kg)     Ht Readings from Last 3 Encounters:  03/29/19 5' 9.5" (1.765 m)  01/31/19 5\' 10"  (1.778 m)  04/29/17 5\' 9"  (1.753 m)      General: The patient is awake, alert and appears not in acute distress. The patient is well groomed. Head: Normocephalic, atraumatic. Neck is supple. Mallampati 3,  neck circumference:15 inches . Nasal airflow  patent.  Retrognathia is not seen.  Dental status: intact. Cardiovascular:  Regular rate and cardiac rhythm by pulse, without distended neck veins. Respiratory: Lungs are clear to auscultation, no wheezing.  Skin:  Without evidence of ankle edema, or rash. Trunk: The patient's posture is erect.   Neurologic exam : The patient is awake and alert, oriented to place and time.   Memory subjective described as intact.   Montreal Cognitive Assessment  03/29/2019    Visuospatial/ Executive (0/5) 1  Naming (0/3) 3  Attention: Read list of digits (0/2) 2  Attention: Read list of letters (0/1) 1  Attention: Serial 7 subtraction starting at 100 (0/3) 3  Language: Repeat phrase (0/2) 2  Language : Fluency (0/1) 1  Abstraction (0/2) 2  Delayed Recall (0/5) 3  Orientation (0/6) 6  Total 24   I adjusted the clock drawing test to 26-30 points as the tremor had altered the drawing , not a visio-spatial distortion.  Attention span & concentration ability appeared only slightly impaired  Speech is fluent, without dysarthria, dysphonia or aphasia.  Mood and affect are appropriate.   Cranial nerves: no loss of smell or taste reported  Pupils are equal and briskly reactive to light. Funduscopic exam deferred. .  Extraocular movements in vertical and horizontal planes were intact and without nystagmus. No Diplopia. Visual fields by finger perimetry are intact. Hearing was intact to soft voice and finger rubbing.    Facial sensation intact to fine touch. Less expression- the patient's facial expression is reduced.   Facial motor strength is symmetric and tongue and uvula move midline.  Neck ROM : rotation, tilt and flexion extension were normal for age and shoulder shrug was symmetrical.    Motor exam:  Symmetric bulk,- increased tone in biceps with mild cog-wheeling and a slight restriction in shoulder elevation.   symmetric grip strength- no carpaltunnel signs.   Sensory:  Fine touch, pinprick and vibration were normal.  Proprioception tested in the upper extremities was normal.   Coordination: Rapid alternating movements in the fingers/hands were of normal speed.  The Finger-to-nose maneuver was intact without evidence of ataxia, dysmetria or tremor. Notable was a mild tremor when writing. This was also seen on the Archimedes' spiral drawing and on the image copy part of his [place].    Gait and station: Patient could rise unassisted from a seated position,  walked without assistive device. Stance is of normal width/ base and the patient turned with 4 steps, is turns were less steady.  He walks fast with a slight stooped posture and decreased arm swing. No pill rolling tremor.  Toe and heel walk were deferred.  Deep tendon reflexes: in the  upper and  lower extremities are symmetric and intact.  Babinski response was deferred .      After spending a total time of 35  minutes face to face and additional time for physical and neurologic examination, review of laboratory studies,  personal review of imaging studies, reports and results of other testing and review of referral information / records as far as provided in visit, I have established the following assessments:  1) MCI or early dementia, all very rapidly developing- 18 month of increasing frequency of transient confusional  episodes.  2) Central sleep apnea and PLMs in REM sleep are most common with  PD or neuro- degenerative disorders.  3) Slight Parkinsonism.- cog-wheeling, slowing, stooped posture, arm swing was seen but was not full.    My Plan is to proceed with:.  0) central sleep apnea-  BiPAP ST will be delivered soon  1) I decided against a DAT scan and instead ordered a metabolic MRI brain -  2) Labs:  I obtained Ach receptor antibodies.- if not aleady done . I ordered covid antibody testing, IGM and IGA were available, not IGG. Added CMET, CBC diff once more.   Addendum: Monday the 07-28th 2020 I received a call from Mrs. Spencer Underwood, indicating her husband had been more confused and longer lasting. He was available to personally speak to me and had notably slowing of word output, repeatedly asking the same questions-seemingly difficulties to retain information. I advised to draw the labs and at the same time under go an EEG here.   The EEG was performed with one hour duration. See attached note/ result; normal . No epileptiform activity , no generalized or focal slowing, but frequently  descending into drowsiness.     I would like to thank Spencer Infante, [place] and [place],  [place] [id number] for allowing me to meet with and to take care of this pleasant patient.   In short, Spencer Underwood is presenting with Central sleep apnea  a symptom that can be attributed to neurodegenerative disorders.   I plan to follow up either personally or through our NP within the next 2 months.   CC: I will share my notes with PCP    Electronically signed by: Larey Seat, Under[place] 03/29/2019 4:59 PM  [facility] and [facility] Sleep Board certified by The [employer] of Sleep Medicine and Diplomate of the [employer] of Sleep Medicine. Board certified In Neurology through the [place], Fellow of the [employer] of Neurology. Medical Director of [employer].

## 2019-03-30 ENCOUNTER — Other Ambulatory Visit: Payer: Self-pay | Admitting: Neurology

## 2019-03-30 ENCOUNTER — Telehealth: Payer: Self-pay | Admitting: Neurology

## 2019-03-30 ENCOUNTER — Other Ambulatory Visit (INDEPENDENT_AMBULATORY_CARE_PROVIDER_SITE_OTHER): Payer: Self-pay

## 2019-03-30 ENCOUNTER — Other Ambulatory Visit: Payer: Self-pay

## 2019-03-30 DIAGNOSIS — G4731 Primary central sleep apnea: Secondary | ICD-10-CM

## 2019-03-30 DIAGNOSIS — Z0289 Encounter for other administrative examinations: Secondary | ICD-10-CM

## 2019-03-30 DIAGNOSIS — H532 Diplopia: Secondary | ICD-10-CM | POA: Diagnosis not present

## 2019-03-30 DIAGNOSIS — G4700 Insomnia, unspecified: Secondary | ICD-10-CM | POA: Diagnosis not present

## 2019-03-30 DIAGNOSIS — G3184 Mild cognitive impairment, so stated: Secondary | ICD-10-CM | POA: Diagnosis not present

## 2019-03-30 DIAGNOSIS — G459 Transient cerebral ischemic attack, unspecified: Secondary | ICD-10-CM | POA: Diagnosis not present

## 2019-03-30 DIAGNOSIS — G3183 Dementia with Lewy bodies: Secondary | ICD-10-CM

## 2019-03-30 DIAGNOSIS — R0683 Snoring: Secondary | ICD-10-CM | POA: Diagnosis not present

## 2019-03-30 NOTE — Telephone Encounter (Signed)
Patient is scheduled for his PET Scan at [facility] 04/06/2019 arrive at 6:30 am for 7:00 apt .  Patient has to be NPO 6 hours before.  Patient needs to check in at [facility].  Patient is on his way to Dr. Edwena Felty  office for labs I will go over all details with them

## 2019-03-30 NOTE — Telephone Encounter (Signed)
Patient wife called and states the patient is worse today then he was yesterday. They don't know if he needs to go to ED. They question whether he needs to stop the machine until the get the new machine. Advised the patient that Dr Brett Fairy decided on wanting the patient to have a metabolic PET scan ordered. Advised that the order is in and informed that the insurance would have to be approved and they will call to schedule. Patient wife was able to finish the conversation and her concerns with Dr Brett Fairy. Dr Dohmeier advised the patient to stop the machine until getting the new machine if he feels he is not benefiting from the machine.  Dr Dohmeier spoke to the patient about the confusion concerns he is having. Pt advised to come in for lab work to be completed.

## 2019-04-02 LAB — HEAVY METALS, BLOOD
Arsenic: 17 ug/L (ref 2–23)
Lead, Blood: 2 ug/dL (ref 0–4)
Mercury: 6.9 ug/L (ref 0.0–14.9)

## 2019-04-02 LAB — SPECIMEN STATUS REPORT

## 2019-04-02 LAB — MYASTHENIA GRAVIS PANEL 2
AChR Binding Ab, Serum: <0.03 nmol/L (ref 0.00–0.24)
Anti-striation Abs: NEGATIVE

## 2019-04-02 LAB — SAR COV2 SEROLOGY (COVID19)AB(IGG),IA: SARS-CoV-2 Ab, IgG: NEGATIVE

## 2019-04-02 LAB — SARS-COV-2 ANTIBODY, IGA: SARS-CoV-2 Antibody, IgA: NEGATIVE

## 2019-04-02 LAB — SARS-COV-2 ANTIBODY, IGM: SARS-CoV-2 Antibody, IgM: NEGATIVE

## 2019-04-06 ENCOUNTER — Ambulatory Visit (HOSPITAL_COMMUNITY)
Admission: RE | Admit: 2019-04-06 | Discharge: 2019-04-06 | Disposition: A | Discharge status: Home / Self Care | Payer: Medicare Other | Source: Ambulatory Visit | Attending: Neurology | Admitting: Neurology

## 2019-04-06 ENCOUNTER — Other Ambulatory Visit: Payer: Self-pay

## 2019-04-06 ENCOUNTER — Encounter: Payer: Self-pay | Admitting: Neurology

## 2019-04-06 DIAGNOSIS — Z79899 Other long term (current) drug therapy: Secondary | ICD-10-CM | POA: Diagnosis not present

## 2019-04-06 DIAGNOSIS — I2510 Atherosclerotic heart disease of native coronary artery without angina pectoris: Secondary | ICD-10-CM | POA: Diagnosis not present

## 2019-04-06 DIAGNOSIS — N189 Chronic kidney disease, unspecified: Secondary | ICD-10-CM | POA: Diagnosis not present

## 2019-04-06 DIAGNOSIS — F0390 Unspecified dementia without behavioral disturbance: Secondary | ICD-10-CM | POA: Diagnosis not present

## 2019-04-06 DIAGNOSIS — Z8546 Personal history of malignant neoplasm of prostate: Secondary | ICD-10-CM | POA: Diagnosis not present

## 2019-04-06 DIAGNOSIS — G3183 Dementia with Lewy bodies: Secondary | ICD-10-CM | POA: Diagnosis not present

## 2019-04-06 DIAGNOSIS — Z7982 Long term (current) use of aspirin: Secondary | ICD-10-CM | POA: Diagnosis not present

## 2019-04-06 DIAGNOSIS — E785 Hyperlipidemia, unspecified: Secondary | ICD-10-CM | POA: Insufficient documentation

## 2019-04-06 LAB — GLUCOSE, CAPILLARY: Glucose-Capillary: 103 mg/dL — ABNORMAL HIGH (ref 70–99)

## 2019-04-06 MED ORDER — FLUDEOXYGLUCOSE F - 18 (FDG) INJECTION
9.8000 | Freq: Once | INTRAVENOUS | Status: AC | PRN
  Administered 2019-04-06: 07:00:00 9.8 via INTRAVENOUS

## 2019-04-06 NOTE — Procedures (Signed)
EEG report for the patient Spencer Cree, MD.  Procedure was including hyperventilation and photic stimulation maneuvers.   The recording time was 55 minutes 29 seconds.  Referring physician is Dr. [name], MD.  History.  Repeated spells of confusion, visual spatial disorientation, diplopia, all confusion is transient but increasing in frequency and duration.  The patient is not on antiseizure medication.  Technical description this is a 21 electrode EEG with a single channel dedicated to EKG.  The electrodes are placed in accordance with the international 10-20 system.  The recording begins with the patient in awake state there is a symmetric background, well-formed anterior to posterior gradient of amplitude and a well-regulated posterior dominant alpha rhythm of 8 Hz.  This posterior dominant rhythm promptly attenuates with eye opening.  Background amplitude ranges between 30 and 60 V.  Photic stimulation did not only lead to transient entrainment and did not provoke epileptiform activity.  Hyperventilation was not performed this led to a transient amplitude buildup, and slowing followed the hyperventilation maneuver  .date Important here is that the patient drifted into sleep as he seemed to struggle with drowsiness for much of the recording.  The last 30 minutes showed normal EEG recording again this time between sleep, drowsy and awake stages.  No epileptiform activity is noted, symmetric K complexes arise.    Normal awake and asleep EEG with 8 hertz background and without epileptiform activity.  Larey Seat, MD   CC Dr. [name], MD

## 2019-04-06 NOTE — Telephone Encounter (Signed)
EEG report for the patient Spencer Cree, MD.  Procedure was including hyperventilation and photic stimulation maneuvers.   The recording time was 55 minutes 29 seconds.  Referring physician is Dr. [name], MD.  History.  Repeated spells of confusion, visual spatial disorientation, diplopia, all confusion is transient but increasing in frequency and duration.  The patient is not on antiseizure medication.  Technical description this is a 21 electrode EEG with a single channel dedicated to EKG.  The electrodes are placed in accordance with the international 10-20 system.  The recording begins with the patient in awake state there is a symmetric background, well-formed anterior to posterior gradient of amplitude and a well-regulated posterior dominant alpha rhythm of 8 Hz.  This posterior dominant rhythm promptly attenuates with eye opening.  Background amplitude ranges between 30 and 60 V.  Photic stimulation did not only lead to transient entrainment and did not provoke epileptiform activity.  Hyperventilation was not performed this led to a transient amplitude buildup, and slowing followed the hyperventilation maneuver  .date Important here is that the patient drifted into sleep as he seemed to struggle with drowsiness for much of the recording.  The last 30 minutes showed normal EEG recording again this time between sleep, drowsy and awake stages.  No epileptiform activity is noted, symmetric K complexes arise.    Normal awake and asleep EEG with 8 hertz background and without epileptiform activity.  Larey Seat, MD   CC Dr. [name], MD

## 2019-04-06 NOTE — Progress Notes (Signed)
EEG report for the patient Spencer Cree, MD.  Procedure was including hyperventilation and photic stimulation maneuvers.   The recording time was 55 minutes 29 seconds.  Referring physician is Dr. [name], MD.  History.  Repeated spells of confusion, visual spatial disorientation, diplopia, all confusion is transient but increasing in frequency and duration.  The patient is not on antiseizure medication.  Technical description this is a 21 electrode EEG with a single channel dedicated to EKG.  The electrodes are placed in accordance with the international 10-20 system.  The recording begins with the patient in awake state there is a symmetric background, well-formed anterior to posterior gradient of amplitude and a well-regulated posterior dominant alpha rhythm of 8 Hz.  This posterior dominant rhythm promptly attenuates with eye opening.  Background amplitude ranges between 30 and 60 V.  Photic stimulation did not only lead to transient entrainment and did not provoke epileptiform activity.  Hyperventilation was not performed this led to a transient amplitude buildup, and slowing followed the hyperventilation maneuver  .date Important here is that the patient drifted into sleep as he seemed to struggle with drowsiness for much of the recording.  The last 30 minutes showed normal EEG recording again this time between sleep, drowsy and awake stages.  No epileptiform activity is noted, symmetric K complexes arise.    Normal awake and asleep EEG with 8 hertz background and without epileptiform activity.  Larey Seat, MD   CC Dr. [name], MD

## 2019-04-20 ENCOUNTER — Encounter: Payer: Self-pay | Admitting: Neurology

## 2019-04-21 LAB — ACHR ABS WITH REFLEX TO MUSK: AChR Binding Ab, Serum: <0.03 nmol/L (ref 0.00–0.24)

## 2019-04-21 LAB — SPECIMEN STATUS REPORT

## 2019-04-21 LAB — MUSK ANTIBODIES: MuSK Antibodies: <1 U/mL

## 2019-04-25 ENCOUNTER — Encounter: Payer: Self-pay | Admitting: Neurology

## 2019-04-26 DIAGNOSIS — H35372 Puckering of macula, left eye: Secondary | ICD-10-CM | POA: Diagnosis not present

## 2019-04-26 DIAGNOSIS — G4731 Primary central sleep apnea: Secondary | ICD-10-CM | POA: Diagnosis not present

## 2019-04-26 DIAGNOSIS — G43809 Other migraine, not intractable, without status migrainosus: Secondary | ICD-10-CM | POA: Diagnosis not present

## 2019-04-26 DIAGNOSIS — H532 Diplopia: Secondary | ICD-10-CM | POA: Diagnosis not present

## 2019-04-27 ENCOUNTER — Telehealth: Payer: Self-pay | Admitting: Neurology

## 2019-04-27 NOTE — Telephone Encounter (Signed)
Called the patients wife and offered an opening for tomorrow at 10 am with check in of 9:30 am.

## 2019-04-27 NOTE — Telephone Encounter (Signed)
Patient is having problems and needs to come to see Dr. Brett Fairy .  Problems he is having . Memory confusion is worse .  Please call patient's wife [phone] or [phone]

## 2019-04-28 ENCOUNTER — Ambulatory Visit (INDEPENDENT_AMBULATORY_CARE_PROVIDER_SITE_OTHER): Payer: Medicare Other | Admitting: Neurology

## 2019-04-28 ENCOUNTER — Encounter: Payer: Self-pay | Admitting: Neurology

## 2019-04-28 ENCOUNTER — Other Ambulatory Visit: Payer: Self-pay

## 2019-04-28 ENCOUNTER — Telehealth: Payer: Self-pay | Admitting: Neurology

## 2019-04-28 VITALS — BP 140/86 | HR 65 | Temp 96.9°F | Ht 69.0 in | Wt 159.0 lb

## 2019-04-28 DIAGNOSIS — G4731 Primary central sleep apnea: Secondary | ICD-10-CM | POA: Diagnosis not present

## 2019-04-28 DIAGNOSIS — R410 Disorientation, unspecified: Secondary | ICD-10-CM

## 2019-04-28 DIAGNOSIS — F0789 Other personality and behavioral disorders due to known physiological condition: Secondary | ICD-10-CM

## 2019-04-28 DIAGNOSIS — F09 Unspecified mental disorder due to known physiological condition: Secondary | ICD-10-CM

## 2019-04-28 NOTE — Telephone Encounter (Signed)
Called Patient and left him a message on home and mobile phone asking him to call be back about his referral I need to know if he can travel ?

## 2019-04-28 NOTE — Telephone Encounter (Signed)
Patient and his wife called back and relayed that they wanted to go to [place] for a sooner apt.

## 2019-04-28 NOTE — Progress Notes (Signed)
SLEEP MEDICINE CLINIC    Provider:  Larey Seat, Spencer Underwood  Primary Care Physician:  [name], Spencer Underwood [place] [id number]     Referring Provider: [name], [address] [place],  [place] [address]          Chief Complaint according to patient   Patient presents with:     New Patient (Initial Visit)           HISTORY OF PRESENT ILLNESS:   Spencer Cree, Spencer Underwood is a 78 y.o. year old White or Caucasian male patient seen herein a Revisit 04/28/2019 , PCP is Dr Joylene Draft. Chief concern according to patient :  transient confusional episodes.   Her reports increasing memory problems, feeling these are related to directional problems, orientation to space, affecting his driving. Recall of names and events are affected. He had for the first time ever trouble to file their tax returns. Multitasking is much harder now.  His wife stated he has trouble to recall the folders, the location of the folders and how to find them. 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  The last 6 weeks have been marked by inability to find simple, common objects in the house.  He had gotten lost and is not driving now- but he hadn't been driving alone since May 31 anyway  and not outside residential area. His wife was always with him.   01-31-2019 He had a TIA like event that day.  Central apnea is currently well controlled, also he had some exeptional nights.  Myasthenia negative - no explanation for diplopia.  I strongly suspect a neurodegenerative process. The confusion is not related to central apnea, but central apnea and confusion are part of the underlying process.      03-29-2019, RV with 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 He  has a  has a past medical history of Allergy, Arthritis, Cancer ([place]), Cataract, Chronic  kidney disease, Coronary atherosclerosis, Heart murmur, History of echocardiogram, radiation therapy, Hyperlipidemia, Personal history of malignant neoplasm of prostate, Sinus bradycardia, and Syncope..   I described in my virtual visit that since a trip to [place] in December / January-2020 there was a notable change in personality and cognitive capability that his family and especially his spouse have noted.  The patient describes his recent health history since April 2018 with a printed journal that his wife brought to the visit.  He had an upper respiratory infection in late April 2018 developed cervical neuralgia and woke out of sleep with this pain seated.  Had a very severe shooting pain up the right side of his head followed by a syncopal episode.  That night the couple went to the [facility] emergency room and he was undergoing cardiac work-up which remained negative.  He was suspected to have had a vasovagal episode may be in relation to pain.   Over the year 2019 there were a few times but an event was mentioned from the prior year to [name] struggle to remember this was only noted and remarkable because he had such an exacting memory.   By late fall 2019 he started to use sometimes the wrong word for example he replaced the word car and truck.  Usually he corrected himself.  He had an onset of  transient double vision, in winter 2019  this began to occur daily and became more obvious but it did not impair his driving or daily activity.  It usually affected him when he was looking at a small object close up or at the person far away.  The family noted no changes in personality demeanor, his sense of humor, his memory and his ability to function.  The couple went on a trip to [place] in [place] between December 26 and September 12, 2018 and enjoyed the trip very much the journey home however took 30 hours with very little sleep and was therefore very taxing.   The following day, January 12, [name]  awoke and noted a visual field change with the left side only.  Localized split image in the left visual field of the left eye was a diagnosis the episode lasted 10 minutes during which she could not read.  He saw [name] who suspected that it may have been a TIA or painless migraine and started [name] on 400 mg magnesium per day.  On January 16s he woke up in the early morning with a 20-minute episode of confusion.  He had some trouble recalling names of friends his thinking was not congruent.  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  On January 17 he saw [name], his primary internist who also thought that it was a TIA versus migraine and prescribed baby aspirin daily.  Somewhere in that time he had an onset of itching at night causing him difficulty sleeping.  He tried antihistamines and tried Aveeno lotion.  The itchiness remained for several months.  By February 2020 the couple left for a vacation of 3 weeks with friends  playing golf, walking, playing bridge -there was a lot of interaction stimulation. During the second evening  on vacation there was a transient moment of confusion while [name] was driving he was not sure which way to turn.  For the residual weeks he showed some flat affect, was a little less talkative and less jovial than normal.  By March 2020 and back in [place] he had more episodes of transient difficulty with orientation.  This all occurred in well known locations or in route to well-known places. [name] decided to stop his statin medication. He also wondered if magnesium cause itching, and discontinued that supplement.  He continued to be confused on directions while driving before initiating a car ride.  He had a hard time to admission how to drive to another  location.  Because of the call with pandemic and shutdown he had risen very little until now.  He drove 2 months from [facility] to [place] without any difficulties on 13 April.  On 17 April he had our telemedicine appointment, which was meant to address sleep primarily.  The sleep study followed on Jan 18, 2019 and was performed by a home sleep test as the lab remained closed at the time.   He was found to have very severe apnea at an AHI of 49.3/h and a higher RDI indicating the presence of snoring.  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  In the meantime we wanted to see if CPAP might correct his apnea and he was given an auto titration capable CPAP device between 5 and 16 cmH2O pressure with 3 cm expiratory pressure relief and heated humidity.  He continued to have high apnea and hypopnea indices between AHI of 30 and 10/h.  The patient drove to and from [facility] for 3 hours each way without difficulty in late May 2 weekends in a row.  On 31 May he woke up with some confusion and transient numbness of the left hand went to the [facility] ED but had to go along.  EKG was unremarkable he was there for over 8 hours and MRI showed no change in the atrophy or small vessel ischemia from January and this time was obtained with contrast.   By 02-09-2019 he presented to the [facility] neurology and memory clinic and I have a copy of this consult available.     Before we could open the lab again I switched him to BiPAP with a maximum inspiratory pressure of 14 minimum expiratory pressure of 4 and a pressure support of 5 cm but he remained with a high apnea index, all apneas arising under the therapy for central apneas again with an AHI of 17.3/h.  Also this AHI was lower than the baseline AHI it was not a treatment for the long-term as it was  insufficient.  On 25 March 2019 he returned for a titration to positive airway pressure during which it was clearly documented that he failed CPAP but he did much better with BiPAP 12/7 cmH2O with a rescue breath setting at 10/min.  The machine was ordered but was not immediately available to the patient and actually was supposed to be delivered on Monday, 05 April 2019    Family medical Rachelle Hora history: No other family member on CPAP with OSA, dementia , or degenerative disorders.   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  Family status is married , with adult children,and  grandchildren. The patient currently likes to golf, travel , to drive.     He was then asked to return for a titration to positive airway pressure on 25 March 2019 he used an F 30 on a full facemask and be started at 10/5 cm water pressure.  The patient developed some central apneas but the number was actively decreasing to an AHI of only 2.4 at the final setting.  With an ST of 10 so-called rescue breaths the patient at 12/7 cmH2O pressure reached a reduction of the AHI to 0.5/h.  Sleep efficiency was 84% which is very good for titration study, most of the arousals that still occurred during the study were actually related to spontaneous arousal or 2 limb movements.  It was notable that PLM's also continued into REM sleep rapid eye movement sleep.       Review of Systems: Out of a complete 14 system review, the patient complains of only the following symptoms, and all other reviewed systems are negative.:  Snoring, nocturia related fragmented sleep. apnea   How likely are you to doze in the following situations: 0 = not likely, 1 = slight chance, 2 = moderate chance, 3 = high chance   Sitting and Reading? Watching Television? Sitting inactive in a public place (theater or meeting)? As a passenger in a car for an hour without a break? Lying down in the afternoon when  circumstances permit? Sitting and talking  to someone? Sitting quietly after lunch without alcohol? In a car, while stopped for a few minutes in traffic?   Total = 6/ 24 points   FSS endorsed at 23/ 63 points.   Social History   Socioeconomic History   Marital status: Married    Spouse name: Not on file   Number of children: Not on file   Years of education: Not on file   Highest education level: Not on file  Occupational History   Occupation: Retired Engineer, drilling  Social Designer, fashion/clothing strain: Not on file   Food insecurity    Worry: Not on file    Inability: Not on file   Transportation needs    Medical: Not on file    Non-medical: Not on file  Tobacco Use   Smoking status: Never Smoker   Smokeless tobacco: Never Used  Substance and Sexual Activity   Alcohol use: Yes    Alcohol/week: 1.0 standard drinks    Types: 1 Cans of beer per week    Comment: daily   Drug use: No   Sexual activity: Not on file  Lifestyle   Physical activity    Days per week: Not on file    Minutes per session: Not on file   Stress: Not on file  Relationships   Social connections    Talks on phone: Not on file    Gets together: Not on file    Attends religious service: Not on file    Active member of club or organization: Not on file    Attends meetings of clubs or organizations: Not on file    Relationship status: Not on file  Other Topics Concern   Not on file  Social History Narrative   Lives in [place] with wife.  Exercises regularly - elliptical, play golf.    Family History  Problem Relation Age of Onset   Hypertension Mother    Thyroid disease Mother    Stroke Father    Coronary artery disease Father    Colon cancer Neg Hx    Esophageal cancer Neg Hx    Pancreatic cancer Neg Hx    Rectal cancer Neg Hx    Stomach cancer Neg Hx     Past Medical History:  Diagnosis Date   Allergy    Arthritis    Cancer ([place])    Cataract    Chronic  kidney disease    atrophic left kidney   Coronary atherosclerosis    a. 2012 - incidentally noted on CT scan of chest;  b. 08/2011 Myoview: normal; c. 03/2015 CPX @ [facility]: normal w/ PAC's, PVC's, and a 3 beat run of NSVT in recovery.   Heart murmur    asymptomatic function murmur.   History of echocardiogram    a. 12/2004 Echo: Nl EF, trace TR/MR.   Hx of radiation therapy    Hyperlipidemia    Personal history of malignant neoplasm of prostate    a. 2008 s/p prostatecomtomy-->radiation-->currently on Lupron injections.   Sinus bradycardia    Syncope    a. 12/2016    Past Surgical History:  Procedure Laterality Date   PROSTATECTOMY     ROBOT ASSISTED LAPAROSCOPIC RADICAL PROSTATECTOMY     SHOULDER SURGERY Right    SKIN GRAFT Left    TONSILLECTOMY       Current Outpatient Medications on File Prior to Visit  Medication Sig Dispense Refill   aspirin EC 81 MG tablet Take 81 mg by mouth daily.  calcium carbonate (CALCIUM 600) 600 MG TABS tablet Take 600 mg by mouth daily with breakfast.     cholecalciferol (VITAMIN D) 1000 units tablet Take 1,000 Units by mouth daily.     ezetimibe (ZETIA) 10 MG tablet Take 10 mg by mouth daily.     ibuprofen (ADVIL,MOTRIN) 400 MG tablet Take 1 tablet (400 mg total) by mouth every 4 (four) hours as needed for mild pain or moderate pain. 30 tablet 0   leuprolide (LUPRON) 30 MG injection Inject 30 mg into the muscle See admin instructions. Inject 30 mg IM every 6 months     Misc Natural Products (OSTEO BI-FLEX ADV DOUBLE ST PO) Take 1 tablet by mouth daily.      Multiple Vitamins-Minerals (CENTRUM SILVER PO) Take 1 tablet by mouth daily.     Current Facility-Administered Medications on File Prior to Visit  Medication Dose Route Frequency Provider Last Rate Last Dose   0.9 %  sodium chloride infusion  500 mL Intravenous Continuous Irene Shipper, Spencer Underwood        Allergies  Allergen Reactions   Atorvastatin     confusion     Physical exam:  Today's Vitals   04/28/19 0947  BP: 140/86  Pulse: 65  Temp: (!) 96.9 F (36.1 C)  Weight: 159 lb (72.1 kg)  Height: 5\' 9"  (1.753 m)   Body mass index is 23.48 kg/m.   Wt Readings from Last 3 Encounters:  04/28/19 159 lb (72.1 kg)  03/29/19 157 lb (71.2 kg)  01/31/19 162 lb 0.6 oz (73.5 kg)     Ht Readings from Last 3 Encounters:  04/28/19 5\' 9"  (1.753 m)  03/29/19 5' 9.5" (1.765 m)  01/31/19 5\' 10"  (1.778 m)      General: The patient is awake, alert and appears not in acute distress. The patient is well groomed. Head: Normocephalic, atraumatic. Neck is supple. Mallampati 3,  neck circumference:15 inches . Nasal airflow  patent.  Retrognathia is not seen.  Dental status: intact. Cardiovascular:  Regular rate and cardiac rhythm by pulse, without distended neck veins. Respiratory: Lungs are clear to auscultation, no wheezing.  Skin:  Without evidence of ankle edema, or rash. Trunk: The patient's posture is erect.   Neurologic exam : The patient is awake and alert, oriented to place and time.   Memory subjective described as intact.   Montreal Cognitive Assessment  03/29/2019  Visuospatial/ Executive (0/5) 1  Naming (0/3) 3  Attention: Read list of digits (0/2) 2  Attention: Read list of letters (0/1) 1  Attention: Serial 7 subtraction starting at 100 (0/3) 3  Language: Repeat phrase (0/2) 2  Language : Fluency (0/1) 1  Abstraction (0/2) 2  Delayed Recall (0/5) 3  Orientation (0/6) 6  Total 24   I adjusted the clock drawing test to 26-30 points as the tremor had altered the drawing , not a visio-spatial distortion.  Attention span & concentration ability appeared only slightly impaired  Speech is fluent, without dysarthria, dysphonia or aphasia.  Mood and affect are appropriate.   Cranial nerves: no loss of smell or taste reported  Pupils are equal and briskly reactive to light. Funduscopic exam deferred. .  Extraocular movements in  vertical and horizontal planes were intact and without nystagmus. No Diplopia. Visual fields by finger perimetry are intact. Hearing was intact to soft voice and finger rubbing.    Facial sensation intact to fine touch. Less expression- the patient's facial expression is reduced.   Facial motor  strength is symmetric and tongue and uvula move midline.  Neck ROM : rotation, tilt and flexion extension were normal for age and shoulder shrug was symmetrical.    Motor exam:  Symmetric bulk,- increased tone in biceps with mild cog-wheeling and a slight restriction in shoulder elevation.   symmetric grip strength- no carpaltunnel signs.   Sensory:  Fine touch, pinprick and vibration were normal.  Proprioception tested in the upper extremities was normal.   Coordination: Rapid alternating movements in the fingers/hands were of normal speed.  The Finger-to-nose maneuver was intact without evidence of ataxia, dysmetria or tremor. Notable was a mild tremor when writing. This was also seen on the Archimedes' spiral drawing and on the image copy part of his [place].    Gait and station: Patient could rise unassisted from a seated position, walked without assistive device. Stance is of normal width/ base and the patient turned with 4 steps, is turns were less steady.  He walks fast with a slight stooped posture and decreased arm swing. No pill rolling tremor.  Toe and heel walk were deferred.  Deep tendon reflexes: in the  upper and lower extremities are symmetric and intact.  Babinski response was deferred .      After spending a total time of 35  minutes face to face and additional time for physical and neurologic examination, review of laboratory studies,  personal review of imaging studies, reports and results of other testing and review of referral information / records as far as provided in visit, I have established the following assessments:  1) MCI or early dementia, all very rapidly developing- 18  month of increasing frequency of transient confusional  episodes.  2) Central sleep apnea and PLMs in REM sleep are most common with  PD or neuro- degenerative disorders.  3) Slight Parkinsonism.- cog-wheeling, slowing, stooped posture, arm swing was seen but was not full.    My Plan is to proceed with:.  Neuropsychological cognitive testing.    I would like to thank [name], Spencer Underwood and [place],  [place] [id number] for allowing me to meet with and to take care of this pleasant patient.   In short, Spencer Cree, Spencer Underwood is presenting with Central sleep apnea  a symptom that can be attributed to neurodegenerative disorders. He has progressive memory loss, confusion and visio-spatial disorientation.  I plan to follow up either personally or through our NP within the next 2 months.  We need a MOCA with each visit.   CC: I will share my notes with PCP    Electronically signed by: Larey Seat, Spencer Underwood 04/28/2019 9:59 AM  [facility] [place] certified by The [employer] of Sleep Medicine and Diplomate of the [employer] of Sleep Medicine. Board certified In Neurology through the [employer], Fellow of the [employer] of Neurology. Medical Director of [employer].

## 2019-05-10 ENCOUNTER — Encounter: Payer: Self-pay | Admitting: Neurology

## 2019-05-16 ENCOUNTER — Encounter: Payer: Self-pay | Admitting: Neurology

## 2019-05-16 DIAGNOSIS — G9349 Other encephalopathy: Secondary | ICD-10-CM

## 2019-05-16 DIAGNOSIS — F0789 Other personality and behavioral disorders due to known physiological condition: Secondary | ICD-10-CM

## 2019-05-16 DIAGNOSIS — R4181 Age-related cognitive decline: Secondary | ICD-10-CM

## 2019-05-16 DIAGNOSIS — G4731 Primary central sleep apnea: Secondary | ICD-10-CM

## 2019-05-16 DIAGNOSIS — F09 Unspecified mental disorder due to known physiological condition: Secondary | ICD-10-CM

## 2019-05-16 DIAGNOSIS — G3184 Mild cognitive impairment, so stated: Secondary | ICD-10-CM

## 2019-05-17 NOTE — Telephone Encounter (Signed)
Pt's wife called. Pt has follow up to [facility] neuropsych testing on 06/02/2019. His next appt with Dr. Brett Fairy is on 06/08/2019. She said it is crucial that the pt be seen right after the 06/02/2019. I explained to her that we are closed on Fridays and I currently do not see any availability on 10/1, 10/5 or 10/6 but she asked that I check with Myriam Jacobson. She verbalized appreciation.

## 2019-05-18 ENCOUNTER — Other Ambulatory Visit: Payer: Self-pay | Admitting: Neurology

## 2019-05-18 DIAGNOSIS — F0789 Other personality and behavioral disorders due to known physiological condition: Secondary | ICD-10-CM

## 2019-05-18 DIAGNOSIS — F09 Unspecified mental disorder due to known physiological condition: Secondary | ICD-10-CM

## 2019-05-18 DIAGNOSIS — G3184 Mild cognitive impairment, so stated: Secondary | ICD-10-CM

## 2019-05-18 DIAGNOSIS — R4181 Age-related cognitive decline: Secondary | ICD-10-CM

## 2019-05-18 NOTE — Addendum Note (Signed)
Addended by: Larey Seat on: 05/18/2019 04:41 PM   Modules accepted: Orders

## 2019-05-18 NOTE — Telephone Encounter (Signed)
We can do a CT head and then follow with CSF fluoroscopy. CSF will be send for tau protein, beta amyloid, and blood testing for apolipoprotein APOE e2, e4, e3. ( 3 markers ).   I considered and reviewed also APP and PSEN/ presenilin 1 and 2- but those are for members  of families prone to have early onset Alzheimer type dementia, something that you don't have- no family history. BTW - the [facility] Provider   center was very helpful.      Larey Seat, MD

## 2019-05-19 ENCOUNTER — Telehealth: Payer: Self-pay | Admitting: Neurology

## 2019-05-19 DIAGNOSIS — R488 Other symbolic dysfunctions: Secondary | ICD-10-CM | POA: Diagnosis not present

## 2019-05-19 DIAGNOSIS — R411 Anterograde amnesia: Secondary | ICD-10-CM | POA: Diagnosis not present

## 2019-05-19 NOTE — Telephone Encounter (Signed)
medicare/bcbs supp order sent to GI. No auth they will reach out to the patient to schedule.  

## 2019-05-20 DIAGNOSIS — R488 Other symbolic dysfunctions: Secondary | ICD-10-CM | POA: Diagnosis not present

## 2019-05-20 DIAGNOSIS — R411 Anterograde amnesia: Secondary | ICD-10-CM | POA: Diagnosis not present

## 2019-05-21 ENCOUNTER — Ambulatory Visit
Admission: RE | Admit: 2019-05-21 | Discharge: 2019-05-21 | Disposition: A | Discharge status: Home / Self Care | Payer: Medicare Other | Source: Ambulatory Visit | Attending: Neurology | Admitting: Neurology

## 2019-05-21 DIAGNOSIS — G9349 Other encephalopathy: Secondary | ICD-10-CM | POA: Diagnosis not present

## 2019-05-21 DIAGNOSIS — R4181 Age-related cognitive decline: Secondary | ICD-10-CM

## 2019-05-22 DIAGNOSIS — Z23 Encounter for immunization: Secondary | ICD-10-CM | POA: Diagnosis not present

## 2019-05-24 NOTE — Telephone Encounter (Signed)
Spencer Underwood, please fax.  Please send to Dr Jolyn Nap,. MD.   Yes, Dr Jolyn Nap [facility] is a great neuro ophthalmologist, definitely would go there-  I will send him a note about moving your appointment up if in any way possible .  Yes, we need to meet after [facility] evaluation.   I have discussed your case, and two colleagues suggested a spinal tap for slow growing infections, or residual virus studies. Both based this idea on your foreign  travels prior to onset of the confusion.   I am happy to arrange for all of this. You could not be on blood thinners and your last MRI/ CT should not be older than 60 days. Shall we do this now. ?

## 2019-05-26 DIAGNOSIS — D2371 Other benign neoplasm of skin of right lower limb, including hip: Secondary | ICD-10-CM | POA: Diagnosis not present

## 2019-05-26 DIAGNOSIS — R197 Diarrhea, unspecified: Secondary | ICD-10-CM | POA: Diagnosis not present

## 2019-05-27 ENCOUNTER — Ambulatory Visit
Admission: RE | Admit: 2019-05-27 | Discharge: 2019-05-27 | Disposition: A | Discharge status: Home / Self Care | Payer: Medicare Other | Source: Ambulatory Visit | Attending: Neurology | Admitting: Neurology

## 2019-05-27 ENCOUNTER — Other Ambulatory Visit: Payer: Self-pay

## 2019-05-27 VITALS — BP 144/84 | HR 55

## 2019-05-27 DIAGNOSIS — R4181 Age-related cognitive decline: Secondary | ICD-10-CM | POA: Diagnosis not present

## 2019-05-27 DIAGNOSIS — G3184 Mild cognitive impairment, so stated: Secondary | ICD-10-CM

## 2019-05-27 DIAGNOSIS — F09 Unspecified mental disorder due to known physiological condition: Secondary | ICD-10-CM | POA: Diagnosis not present

## 2019-05-27 DIAGNOSIS — R208 Other disturbances of skin sensation: Secondary | ICD-10-CM

## 2019-05-27 DIAGNOSIS — R351 Nocturia: Secondary | ICD-10-CM

## 2019-05-27 DIAGNOSIS — G4700 Insomnia, unspecified: Secondary | ICD-10-CM | POA: Diagnosis not present

## 2019-05-27 DIAGNOSIS — F0789 Other personality and behavioral disorders due to known physiological condition: Secondary | ICD-10-CM | POA: Diagnosis not present

## 2019-05-27 DIAGNOSIS — G459 Transient cerebral ischemic attack, unspecified: Secondary | ICD-10-CM

## 2019-05-27 DIAGNOSIS — R413 Other amnesia: Secondary | ICD-10-CM | POA: Diagnosis not present

## 2019-05-27 NOTE — Progress Notes (Signed)
One SST tube of blood drawn from left AC space; site unremarkable.  Also, urine sample collected as requested.

## 2019-05-27 NOTE — Discharge Instructions (Signed)

## 2019-05-31 ENCOUNTER — Encounter: Payer: Self-pay | Admitting: Neurology

## 2019-06-01 DIAGNOSIS — F0390 Unspecified dementia without behavioral disturbance: Secondary | ICD-10-CM | POA: Diagnosis not present

## 2019-06-02 DIAGNOSIS — F0390 Unspecified dementia without behavioral disturbance: Secondary | ICD-10-CM | POA: Diagnosis not present

## 2019-06-07 ENCOUNTER — Telehealth: Payer: Self-pay | Admitting: Neurology

## 2019-06-07 NOTE — Telephone Encounter (Signed)
Called the [facility] and was questioning the results of tests that were pending. I was on the phone for 25 min with someone. Advised the pt has an apt tomorrow and we would really like to know these results prior to his apt. The person sent a message to the lab manager because she is unsure why we don't have these results. She asked is we completed additional form for the lab.advised we did. Advised I sent this form to [facility] since they were the ones to complete the testing and send the labs in. I have a fax confirmation on 05/25/2019 at 7:26 am where it was sent. They should have sent it with the lab work. The lab personnel got my number and information here and will have someone check on this for [place] and call me back.

## 2019-06-08 ENCOUNTER — Ambulatory Visit (INDEPENDENT_AMBULATORY_CARE_PROVIDER_SITE_OTHER): Payer: Medicare Other | Admitting: Neurology

## 2019-06-08 ENCOUNTER — Other Ambulatory Visit: Payer: Self-pay

## 2019-06-08 ENCOUNTER — Encounter: Payer: Self-pay | Admitting: Neurology

## 2019-06-08 VITALS — BP 142/80 | HR 63 | Temp 97.1°F | Ht 69.0 in | Wt 157.0 lb

## 2019-06-08 DIAGNOSIS — G3184 Mild cognitive impairment, so stated: Secondary | ICD-10-CM

## 2019-06-08 DIAGNOSIS — F0789 Other personality and behavioral disorders due to known physiological condition: Secondary | ICD-10-CM | POA: Diagnosis not present

## 2019-06-08 DIAGNOSIS — G4731 Primary central sleep apnea: Secondary | ICD-10-CM | POA: Insufficient documentation

## 2019-06-08 DIAGNOSIS — F09 Unspecified mental disorder due to known physiological condition: Secondary | ICD-10-CM | POA: Diagnosis not present

## 2019-06-08 DIAGNOSIS — R4181 Age-related cognitive decline: Secondary | ICD-10-CM

## 2019-06-08 NOTE — Addendum Note (Signed)
Addended by: Inis Sizer D on: 06/08/2019 03:18 PM   Modules accepted: Orders

## 2019-06-08 NOTE — Telephone Encounter (Signed)
Called again and spoke with Marissa at [facility] who was able to look at the notes for the patient. Stating that the sample should be resulted and faxed directly to our office today. This was noted at 9:24 am today. I asked if possible to please have it sent over by 2 pm. She states that we should get it by then. I asked also could they inquire on more information as to why the oligoclonal bands were not completed. Based off the req for [facility] it appeared that it was in the same tube # 3 as the beta amyloid test. She states she would try and get a more detailed answer on that as well.

## 2019-06-08 NOTE — Progress Notes (Signed)
SLEEP MEDICINE CLINIC    Provider:  Larey Seat, Spencer Underwood  Primary Care Physician:  [name], Spencer Underwood [place] [address]     Referring Provider: [name], [place],  [place] [address]          Chief Complaint according to patient   Patient presents with:    . New Patient (Initial Visit)     Rv after cognitive testing and CSF testing. Had already PET scan, and blood tests. We are waiting for alpha-lipoprotein and CSF tau.        HISTORY OF PRESENT ILLNESS:   Spencer Cree, Spencer Underwood is a 78 y.o. year old White or Caucasian male patient seen in a Rv 06-08-2019.  He has had confusion- more over the last 6 weeks, not related to vivid dreams.  He still j has reading difficulties, visio= spatial problems. He has difficulties imaging/ projecting a route, and some areas look unfamiliar.  Memory for fanacial records has declined since July.  He is more tired than ever, he had several times enuresis.  he has less facial expression. Feels cold a lot, neighbors mentioned a puffy face.    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 The clinical history would also fit that diagnosis.  PET scan did not show atrophy or metabolic decreease in that region of the brain .    Revisit 06/08/2019 , PCP is Dr Joylene Draft. Chief concern according to patient :  transient confusional episodes.  Spencer Underwood  reports increasing memory problems, feeling these are related to directional problems, visio spatial- orientation to space, affecting his driving, his reading, his judging distance. . Recall of names and events are affected. He had for the first time ever trouble to file their tax returns. Multitasking is much harder now.  His wife stated he has trouble to recall the folders, the location of the  folders and how to find them. 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  The last 6 weeks have been marked by inability to find simple, common objects in the house.  He had gotten lost and is not driving now- but he hadn't been driving alone since May 31 anyway  and not outside residential area. His wife was always with him.   01-31-2019 He had a TIA like event that day.  Central apnea is currently well controlled, also he had some exeptional nights.  Myasthenia negative - no explanation for diplopia.  I strongly suspect a neurodegenerative process. The confusion is not related to central apnea, but central apnea and confusion are part of the underlying process.      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 He  has a  has a past medical history of Allergy, Arthritis, Cancer ([place]), Cataract, Chronic kidney disease, Coronary atherosclerosis, Heart murmur, History of echocardiogram, radiation therapy, Hyperlipidemia, Personal history of malignant neoplasm of prostate, Sinus bradycardia, and Syncope..   I described in my virtual visit that since a trip to [place] in December / January-2020 there was a notable change in personality and cognitive capability that his family and especially his spouse have noted.  The patient describes his recent health history since April 2018  with a printed journal that his wife brought to the visit.  He had an upper respiratory infection in late April 2018 developed cervical neuralgia and woke out of sleep with this pain seated.  Had a very severe shooting pain up the right side of his head followed by a syncopal episode.  That night the couple went to the [facility] emergency room and he was undergoing cardiac work-up which remained negative.  He was suspected to have had a vasovagal episode may be in  relation to pain.   Over the year 2019 there were a few times but an event was mentioned from the prior year to Spencer Underwood struggle to remember this was only noted and remarkable because he had such an exacting memory.   By late fall 2019 he started to use sometimes the wrong word for example he replaced the word car and truck.  Usually he corrected himself.  He had an onset of  transient double vision, in winter 2019 this began to occur daily and became more obvious but it did not impair his driving or daily activity.  It usually affected him when he was looking at a small object close up or at the person far away.  The family noted no changes in personality demeanor, his sense of humor, his memory and his ability to function.  The couple went on a trip to [place] in [place] between December 26 and September 12, 2018 and enjoyed the trip very much the journey home however took 30 hours with very little sleep and was therefore very taxing.   The following day, January 12, Spencer Underwood awoke and noted a visual field change with the left side only.  Localized split image in the left visual field of the left eye was a diagnosis the episode lasted 10 minutes during which she could not read.  He saw [name] who suspected that it may have been a TIA or painless migraine and started [name] on 400 mg magnesium per day.  On January 16s he woke up in the early morning with a 20-minute episode of confusion.  He had some trouble recalling names of friends his thinking was not congruent.  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  On January 17 he saw [name], his primary internist who also thought that it was a TIA versus migraine and prescribed baby aspirin daily.  Somewhere in that time he had an onset of itching at  night causing him difficulty sleeping.  He tried antihistamines and tried Aveeno lotion.  The itchiness remained for several months.  By February 2020 the couple left for a vacation of 3 weeks with friends  playing golf, walking, playing bridge -there was a lot of interaction stimulation. During the second evening  on vacation there was a transient moment of confusion while Spencer Underwood was driving he was not sure which way to turn.  For the residual weeks he showed some flat affect, was a little less talkative and less jovial than normal.  By March 2020 and back in [place] he had more episodes of transient difficulty with orientation.  This all occurred in well known locations or in route to well-known places. Spencer Underwood decided to stop his statin medication. He also wondered if magnesium cause itching,  and discontinued that supplement.  He continued to be confused on directions while driving before initiating a car ride.  He had a hard time to admission how to drive to another location.  Because of the call with pandemic and shutdown he had risen very little until now.  He drove 2 months from [facility] to [place] without any difficulties on 13 April.  On 17 April he had our telemedicine appointment, which was meant to address sleep primarily.  The sleep study followed on Jan 18, 2019 and was performed by a home sleep test as the lab remained closed at the time.   He was found to have very severe apnea at an AHI of 49.3/h and a higher RDI indicating the presence of snoring.  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   In the meantime we wanted to see if CPAP might correct his apnea and he was given an auto titration capable CPAP device between 5 and 16 cmH2O pressure with 3 cm expiratory pressure relief and heated humidity.  He  continued to have high apnea and hypopnea indices between AHI of 30 and 10/h.  The patient drove to and from [facility] for 3 hours each way without difficulty in late May 2 weekends in a row.  On 31 May he woke up with some confusion and transient numbness of the left hand went to the [facility] ED but had to go along.  EKG was unremarkable he was there for over 8 hours and MRI showed no change in the atrophy or small vessel ischemia from January and this time was obtained with contrast.   By 02-09-2019 he presented to the [facility] neurology and memory clinic and I have a copy of this consult available.     Before we could open the lab again I switched him to BiPAP with a maximum inspiratory pressure of 14 minimum expiratory pressure of 4 and a pressure support of 5 cm but he remained with a high apnea index, all apneas arising under the therapy for central apneas again with an AHI of 17.3/h.  Also this AHI was lower than the baseline AHI it was not a treatment for the long-term as it was insufficient.  On 25 March 2019 he returned for a titration to positive airway pressure during which it was clearly documented that he failed CPAP but he did much better with BiPAP 12/7 cmH2O with a rescue breath setting at 10/min.  The machine was ordered but was not immediately available to the patient and actually was supposed to be delivered on Monday, 05 April 2019    Family medical Rachelle Hora history: No other family member on CPAP with OSA, dementia , or degenerative disorders.   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  Family status is married , with adult children,and  grandchildren. The patient currently likes to golf, travel , to drive.     He was then asked to return for a titration to positive airway pressure on 25 March 2019 he used an F 30 on a full facemask and be started at 10/5 cm water pressure.  The patient developed some central apneas but the number was actively  decreasing to an AHI of only 2.4 at the final setting.  With an ST of  10 so-called rescue breaths the patient at 12/7 cmH2O pressure reached a reduction of the AHI to 0.5/h.  Sleep efficiency was 84% which is very good for titration study, most of the arousals that still occurred during the study were actually related to spontaneous arousal or 2 limb movements.  It was notable that PLM's also continued into REM sleep rapid eye movement sleep.       Review of Systems: Out of a complete 14 system review, the patient complains of only the following symptoms, and all other reviewed systems are negative.:  Snoring, nocturia related fragmented sleep. apnea   How likely are you to doze in the following situations: 0 = not likely, 1 = slight chance, 2 = moderate chance, 3 = high chance   Sitting and Reading? Watching Television? Sitting inactive in a public place (theater or meeting)? As a passenger in a car for an hour without a break? Lying down in the afternoon when circumstances permit? Sitting and talking to someone? Sitting quietly after lunch without alcohol? In a car, while stopped for a few minutes in traffic?   Total = 6/ 24 points   FSS endorsed at 23/ 63 points.   Social History   Socioeconomic History  . Marital status: Married    Spouse name: Not on file  . Number of children: Not on file  . Years of education: Not on file  . Highest education level: Not on file  Occupational History  . Occupation: Retired Engineer, drilling  Social Needs  . Financial resource strain: Not on file  . Food insecurity    Worry: Not on file    Inability: Not on file  . Transportation needs    Medical: Not on file    Non-medical: Not on file  Tobacco Use  . Smoking status: Never Smoker  . Smokeless tobacco: Never Used  Substance and Sexual Activity  . Alcohol use: Yes    Alcohol/week: 1.0 standard drinks    Types: 1 Cans of beer per week    Comment: daily  . Drug use: No  . Sexual activity:  Not on file  Lifestyle  . Physical activity    Days per week: Not on file    Minutes per session: Not on file  . Stress: Not on file  Relationships  . Social Herbalist on phone: Not on file    Gets together: Not on file    Attends religious service: Not on file    Active member of club or organization: Not on file    Attends meetings of clubs or organizations: Not on file    Relationship status: Not on file  Other Topics Concern  . Not on file  Social History Narrative   Lives in [place] with wife.  Exercises regularly - elliptical, play golf.    Family History  Problem Relation Age of Onset  . Hypertension Mother   . Thyroid disease Mother   . Stroke Father   . Coronary artery disease Father   . Colon cancer Neg Hx   . Esophageal cancer Neg Hx   . Pancreatic cancer Neg Hx   . Rectal cancer Neg Hx   . Stomach cancer Neg Hx     Past Medical History:  Diagnosis Date  . Allergy   . Arthritis   . Cancer ([place])   . Cataract   . Chronic kidney disease    atrophic left kidney  . Coronary atherosclerosis    a. 2012 -  incidentally noted on CT scan of chest;  b. 08/2011 Myoview: normal; c. 03/2015 CPX @ [facility]: normal w/ PAC's, PVC's, and a 3 beat run of NSVT in recovery.  Marland Kitchen Heart murmur    asymptomatic function murmur.  Marland Kitchen History of echocardiogram    a. 12/2004 Echo: Nl EF, trace TR/MR.  Marland Kitchen Hx of radiation therapy   . Hyperlipidemia   . Personal history of malignant neoplasm of prostate    a. 2008 s/p prostatecomtomy-->radiation-->currently on Lupron injections.  . Sinus bradycardia   . Syncope    a. 12/2016    Past Surgical History:  Procedure Laterality Date  . PROSTATECTOMY    . ROBOT ASSISTED LAPAROSCOPIC RADICAL PROSTATECTOMY    . SHOULDER SURGERY Right   . SKIN GRAFT Left   . TONSILLECTOMY       Current Outpatient Medications on File Prior to Visit  Medication Sig Dispense Refill  . aspirin EC 81 MG tablet Take 81 mg by mouth daily.    Marland Kitchen ezetimibe  (ZETIA) 10 MG tablet Take 10 mg by mouth daily.    Marland Kitchen ibuprofen (ADVIL,MOTRIN) 400 MG tablet Take 1 tablet (400 mg total) by mouth every 4 (four) hours as needed for mild pain or moderate pain. 30 tablet 0  . leuprolide (LUPRON) 30 MG injection Inject 30 mg into the muscle See admin instructions. Inject 30 mg IM every 6 months    . Misc Natural Products (OSTEO BI-FLEX ADV JOINT SHIELD PO) Take by mouth.    . Multiple Vitamins-Minerals (CENTRUM SILVER PO) Take 1 tablet by mouth daily.     Current Facility-Administered Medications on File Prior to Visit  Medication Dose Route Frequency Provider Last Rate Last Dose  . 0.9 %  sodium chloride infusion  500 mL Intravenous Continuous Irene Shipper, Spencer Underwood        Allergies  Allergen Reactions  . Atorvastatin Other (See Comments)    confusion    Physical exam:  Today's Vitals   06/08/19 1411  BP: (!) 142/80  Pulse: 63  Temp: (!) 97.1 F (36.2 C)  Weight: 157 lb (71.2 kg)  Height: 5\' 9"  (1.753 m)   Body mass index is 23.18 kg/m.   Wt Readings from Last 3 Encounters:  06/08/19 157 lb (71.2 kg)  04/28/19 159 lb (72.1 kg)  03/29/19 157 lb (71.2 kg)     Ht Readings from Last 3 Encounters:  06/08/19 5\' 9"  (1.753 m)  04/28/19 5\' 9"  (1.753 m)  03/29/19 5' 9.5" (1.765 m)      General: The patient is awake, alert and appears not in acute distress. The patient is well groomed. Head: Normocephalic, atraumatic. Neck is supple. Mallampati 3,  neck circumference:15 inches . Nasal airflow  patent.  Retrognathia is not seen.  Dental status: intact. Cardiovascular:  Regular rate and cardiac rhythm by pulse, without distended neck veins. Respiratory: Lungs are clear to auscultation, no wheezing.  Skin:  Without evidence of ankle edema, or rash. Trunk: The patient's posture is erect.   Neurologic exam : The patient is awake and alert, oriented to place and time.   Memory subjective described as intact.   Montreal Cognitive Assessment Blind  06/08/2019 03/29/2019  Attention: Read list of digits (0/2) 2 2  Attention: Read list of letters (0/1) 1 1  Attention: Serial 7 subtraction starting at 100 (0/3) 3 3  Language: Repeat phrase (0/2) 2 2  Language : Fluency (0/1) 1 1  Abstraction (0/2) 2 2  Delayed Recall (0/5)  1 3  Orientation (0/6) 6 6  Total 25 20    MMSE 29-30 on 06-08-2019  Attention span & concentration ability appeared only slightly impaired  Speech is fluent, without dysarthria, dysphonia or aphasia.  Mood and affect are appropriate.   Cranial nerves: no loss of smell or taste reported  Pupils are equal and briskly reactive to light. Funduscopic exam deferred. .  Extraocular movements in vertical and horizontal planes were intact and without nystagmus. No Diplopia. Visual fields by finger perimetry are intact. Hearing was intact to soft voice and finger rubbing.    Facial sensation intact to fine touch. Less expression- the patient's facial expression is reduced.   Facial motor strength is symmetric and tongue and uvula move midline.  Neck ROM : rotation, tilt and flexion extension were normal for age and shoulder shrug was symmetrical.    Motor exam:  Symmetric bulk,- increased tone in biceps with mild cog-wheeling and a slight restriction in shoulder elevation.   symmetric grip strength- no carpaltunnel signs.   Sensory:  Fine touch, pinprick and vibration were normal.  Proprioception tested in the upper extremities was normal.   Coordination: Rapid alternating movements in the fingers/hands were of normal speed.  The Finger-to-nose maneuver was intact without evidence of ataxia, dysmetria or tremor. Notable was a mild tremor when writing. This was also seen on the Archimedes' spiral drawing and on the image copy part of his [place].   Gait and station: Patient could rise unassisted from a seated position, walked without assistive device. Stance is of normal width/ base and the patient turned with 4 steps, is  turns were less steady.  He walks fast with a slight stooped posture and decreased arm swing. No pill rolling tremor.  Toe and heel walk were deferred. Deep tendon reflexes: in the  upper and lower extremities are symmetric and intact.  Babinski response was deferred .      After spending a total time of 35  minutes face to face and additional time for physical and neurologic examination, review of laboratory studies,  personal review of imaging studies, reports and results of other testing and review of referral information / records as far as provided in visit, I have established the following assessments:  1) MCI or early dementia, all very rapidly developing- 18 month of increasing frequency of transient confusional  episodes. We are now discussion posterior cortical atrophy clinical signs, without PET scan to show this being present.  CSF testing  TAU- and ApoLipo protein- Still pending.  2) Central sleep apnea and PLMs in REM sleep are most common with  PD or neuro- degenerative disorders.  3) Slight Parkinsonism.- cog-wheeling, slowing, stooped posture, arm swing was seen but was not full.    My Plan is to proceed with:.  Neurodegenerative process, further clinical trials/ Calling Dr. Nanda Quinton, Spencer Underwood.  Advancing appointment with Dr Hassell Done, at [facility]  Tau and APO-LIPO-protein are back.  I would like to thank [name], Spencer Underwood and [place],  [place] [address] for allowing me to meet with and to take care of this pleasant patient.   In short, Spencer Cree, Spencer Underwood is presenting with Central sleep apnea  a symptom that can be attributed to neurodegenerative disorders. He has progressive memory loss, confusion and visio-spatial disorientation.  I plan to follow up either personally or through our NP within the next 2 months.  We need a MOCA with each visit.   CC: I will share my notes  with PCP    Electronically signed by: Larey Seat, Spencer Underwood 06/08/2019 2:31 PM  [facility] Sleep Board certified by The [employer] of Sleep Medicine and Diplomate of the [employer] of Sleep Medicine. Board certified In Neurology through the [facility], Fellow of the [employer] of Neurology. Medical Director of [employer].

## 2019-06-09 ENCOUNTER — Encounter: Payer: Self-pay | Admitting: Neurology

## 2019-06-09 ENCOUNTER — Telehealth: Payer: Self-pay | Admitting: Neurology

## 2019-06-09 DIAGNOSIS — F0390 Unspecified dementia without behavioral disturbance: Secondary | ICD-10-CM | POA: Diagnosis not present

## 2019-06-09 DIAGNOSIS — G319 Degenerative disease of nervous system, unspecified: Secondary | ICD-10-CM | POA: Diagnosis not present

## 2019-06-09 NOTE — Telephone Encounter (Signed)
Dear [name],  We just received your last CSF tests, and 2 markers of neurodegenerative disease were positive, the  likelihood of a PRION disorder was less than 1 % ( good news) . Positive were 14-3-3  and the Tau protein, which is associated with Alzheimer's disease.   Given that neuropsychological testing indicated posterior cerebral atrophy, but our PET has not had such changes, I like Dr Jenetta Downer Warden Fillers at [facility] to see you again, and his secretary, Estill Bamberg, will make an appointment with [facility] Bible PA who is supervised by Dr Jenetta Downer Warden Fillers. This visit is easier to schedule, and will not be in lieu of your scheduled December visit.    I am hoping he has a research connection , too.   Yours, Larey Seat, MD  October, 7th , 2020  Cc Dr Werner Lean / Attention [facility] Bible, [place]  Fax [phone]

## 2019-06-09 NOTE — Telephone Encounter (Signed)
I have copied and pasted this information to mychart message and sent to the patient. I have printed office notes as well as other lab findings from previous visit and sent with other notes and information for the patient to the fax number provided by Dr Dohmeier to the [facility] office so that they may get him in urgently as soon as next Wednesday.

## 2019-06-14 DIAGNOSIS — C61 Malignant neoplasm of prostate: Secondary | ICD-10-CM | POA: Diagnosis not present

## 2019-06-14 LAB — APOE ALZHEIMER'S RISK

## 2019-06-15 ENCOUNTER — Encounter (HOSPITAL_COMMUNITY): Payer: Self-pay

## 2019-06-15 ENCOUNTER — Encounter: Payer: Self-pay | Admitting: Neurology

## 2019-06-15 ENCOUNTER — Other Ambulatory Visit: Payer: Self-pay

## 2019-06-15 ENCOUNTER — Inpatient Hospital Stay (HOSPITAL_COMMUNITY): Payer: Medicare Other

## 2019-06-15 ENCOUNTER — Inpatient Hospital Stay (HOSPITAL_COMMUNITY)
Admission: EM | Admit: 2019-06-15 | Discharge: 2019-06-18 | DRG: 062 | Disposition: A | Discharge status: Skilled Nursing Facility | Payer: Medicare Other | Attending: Neurology | Admitting: Neurology

## 2019-06-15 ENCOUNTER — Emergency Department (HOSPITAL_COMMUNITY): Payer: Medicare Other

## 2019-06-15 DIAGNOSIS — R569 Unspecified convulsions: Secondary | ICD-10-CM | POA: Diagnosis present

## 2019-06-15 DIAGNOSIS — R1310 Dysphagia, unspecified: Secondary | ICD-10-CM | POA: Diagnosis present

## 2019-06-15 DIAGNOSIS — Z923 Personal history of irradiation: Secondary | ICD-10-CM | POA: Diagnosis not present

## 2019-06-15 DIAGNOSIS — G4733 Obstructive sleep apnea (adult) (pediatric): Secondary | ICD-10-CM | POA: Diagnosis not present

## 2019-06-15 DIAGNOSIS — I63412 Cerebral infarction due to embolism of left middle cerebral artery: Secondary | ICD-10-CM | POA: Diagnosis present

## 2019-06-15 DIAGNOSIS — R414 Neurologic neglect syndrome: Secondary | ICD-10-CM | POA: Diagnosis present

## 2019-06-15 DIAGNOSIS — Z7982 Long term (current) use of aspirin: Secondary | ICD-10-CM

## 2019-06-15 DIAGNOSIS — Z791 Long term (current) use of non-steroidal anti-inflammatories (NSAID): Secondary | ICD-10-CM

## 2019-06-15 DIAGNOSIS — Z79899 Other long term (current) drug therapy: Secondary | ICD-10-CM

## 2019-06-15 DIAGNOSIS — I639 Cerebral infarction, unspecified: Secondary | ICD-10-CM | POA: Diagnosis not present

## 2019-06-15 DIAGNOSIS — G4700 Insomnia, unspecified: Secondary | ICD-10-CM | POA: Diagnosis present

## 2019-06-15 DIAGNOSIS — R4189 Other symptoms and signs involving cognitive functions and awareness: Secondary | ICD-10-CM | POA: Diagnosis present

## 2019-06-15 DIAGNOSIS — I340 Nonrheumatic mitral (valve) insufficiency: Secondary | ICD-10-CM | POA: Diagnosis not present

## 2019-06-15 DIAGNOSIS — J302 Other seasonal allergic rhinitis: Secondary | ICD-10-CM | POA: Diagnosis present

## 2019-06-15 DIAGNOSIS — I361 Nonrheumatic tricuspid (valve) insufficiency: Secondary | ICD-10-CM | POA: Diagnosis not present

## 2019-06-15 DIAGNOSIS — I2510 Atherosclerotic heart disease of native coronary artery without angina pectoris: Secondary | ICD-10-CM | POA: Diagnosis present

## 2019-06-15 DIAGNOSIS — Z8546 Personal history of malignant neoplasm of prostate: Secondary | ICD-10-CM | POA: Diagnosis not present

## 2019-06-15 DIAGNOSIS — R4781 Slurred speech: Secondary | ICD-10-CM | POA: Diagnosis not present

## 2019-06-15 DIAGNOSIS — G9349 Other encephalopathy: Secondary | ICD-10-CM | POA: Diagnosis present

## 2019-06-15 DIAGNOSIS — R339 Retention of urine, unspecified: Secondary | ICD-10-CM | POA: Diagnosis not present

## 2019-06-15 DIAGNOSIS — R269 Unspecified abnormalities of gait and mobility: Secondary | ICD-10-CM | POA: Diagnosis present

## 2019-06-15 DIAGNOSIS — G9389 Other specified disorders of brain: Secondary | ICD-10-CM | POA: Diagnosis not present

## 2019-06-15 DIAGNOSIS — F0789 Other personality and behavioral disorders due to known physiological condition: Secondary | ICD-10-CM

## 2019-06-15 DIAGNOSIS — Z9079 Acquired absence of other genital organ(s): Secondary | ICD-10-CM | POA: Diagnosis not present

## 2019-06-15 DIAGNOSIS — H547 Unspecified visual loss: Secondary | ICD-10-CM | POA: Diagnosis present

## 2019-06-15 DIAGNOSIS — G40909 Epilepsy, unspecified, not intractable, without status epilepticus: Secondary | ICD-10-CM | POA: Diagnosis not present

## 2019-06-15 DIAGNOSIS — R001 Bradycardia, unspecified: Secondary | ICD-10-CM | POA: Diagnosis present

## 2019-06-15 DIAGNOSIS — R208 Other disturbances of skin sensation: Secondary | ICD-10-CM | POA: Diagnosis present

## 2019-06-15 DIAGNOSIS — I6300 Cerebral infarction due to thrombosis of unspecified precerebral artery: Secondary | ICD-10-CM | POA: Diagnosis not present

## 2019-06-15 DIAGNOSIS — K5901 Slow transit constipation: Secondary | ICD-10-CM | POA: Diagnosis present

## 2019-06-15 DIAGNOSIS — Z8673 Personal history of transient ischemic attack (TIA), and cerebral infarction without residual deficits: Secondary | ICD-10-CM

## 2019-06-15 DIAGNOSIS — G4731 Primary central sleep apnea: Secondary | ICD-10-CM | POA: Diagnosis present

## 2019-06-15 DIAGNOSIS — G4089 Other seizures: Secondary | ICD-10-CM | POA: Diagnosis present

## 2019-06-15 DIAGNOSIS — Z823 Family history of stroke: Secondary | ICD-10-CM

## 2019-06-15 DIAGNOSIS — R4587 Impulsiveness: Secondary | ICD-10-CM | POA: Diagnosis not present

## 2019-06-15 DIAGNOSIS — R451 Restlessness and agitation: Secondary | ICD-10-CM

## 2019-06-15 DIAGNOSIS — R0989 Other specified symptoms and signs involving the circulatory and respiratory systems: Secondary | ICD-10-CM | POA: Diagnosis not present

## 2019-06-15 DIAGNOSIS — Z20828 Contact with and (suspected) exposure to other viral communicable diseases: Secondary | ICD-10-CM | POA: Diagnosis present

## 2019-06-15 DIAGNOSIS — E785 Hyperlipidemia, unspecified: Secondary | ICD-10-CM | POA: Diagnosis present

## 2019-06-15 DIAGNOSIS — R5381 Other malaise: Secondary | ICD-10-CM | POA: Diagnosis not present

## 2019-06-15 DIAGNOSIS — E871 Hypo-osmolality and hyponatremia: Secondary | ICD-10-CM | POA: Diagnosis present

## 2019-06-15 DIAGNOSIS — F0280 Dementia in other diseases classified elsewhere without behavioral disturbance: Secondary | ICD-10-CM | POA: Diagnosis present

## 2019-06-15 DIAGNOSIS — R29708 NIHSS score 8: Secondary | ICD-10-CM | POA: Diagnosis present

## 2019-06-15 DIAGNOSIS — R4701 Aphasia: Secondary | ICD-10-CM | POA: Diagnosis present

## 2019-06-15 DIAGNOSIS — Z8249 Family history of ischemic heart disease and other diseases of the circulatory system: Secondary | ICD-10-CM

## 2019-06-15 DIAGNOSIS — Z888 Allergy status to other drugs, medicaments and biological substances status: Secondary | ICD-10-CM

## 2019-06-15 DIAGNOSIS — G479 Sleep disorder, unspecified: Secondary | ICD-10-CM | POA: Diagnosis not present

## 2019-06-15 DIAGNOSIS — F09 Unspecified mental disorder due to known physiological condition: Secondary | ICD-10-CM | POA: Diagnosis not present

## 2019-06-15 DIAGNOSIS — G309 Alzheimer's disease, unspecified: Secondary | ICD-10-CM | POA: Diagnosis present

## 2019-06-15 DIAGNOSIS — R410 Disorientation, unspecified: Secondary | ICD-10-CM | POA: Diagnosis not present

## 2019-06-15 DIAGNOSIS — Z79890 Hormone replacement therapy (postmenopausal): Secondary | ICD-10-CM

## 2019-06-15 DIAGNOSIS — G9340 Encephalopathy, unspecified: Secondary | ICD-10-CM | POA: Diagnosis not present

## 2019-06-15 DIAGNOSIS — Z7289 Other problems related to lifestyle: Secondary | ICD-10-CM

## 2019-06-15 DIAGNOSIS — R3915 Urgency of urination: Secondary | ICD-10-CM | POA: Diagnosis present

## 2019-06-15 DIAGNOSIS — R7303 Prediabetes: Secondary | ICD-10-CM | POA: Diagnosis present

## 2019-06-15 DIAGNOSIS — R479 Unspecified speech disturbances: Secondary | ICD-10-CM | POA: Diagnosis not present

## 2019-06-15 DIAGNOSIS — Z7902 Long term (current) use of antithrombotics/antiplatelets: Secondary | ICD-10-CM | POA: Diagnosis not present

## 2019-06-15 LAB — PROTIME-INR
INR: 1 (ref 0.8–1.2)
Prothrombin Time: 13.5 seconds (ref 11.4–15.2)

## 2019-06-15 LAB — CSF CELL COUNT WITH DIFFERENTIAL
RBC Count, CSF: 9 cells/uL (ref 0–10)
WBC, CSF: 4 cells/uL (ref 0–5)

## 2019-06-15 LAB — COMPREHENSIVE METABOLIC PANEL
ALT: 17 U/L (ref 0–44)
AST: 26 U/L (ref 15–41)
Albumin: 4.2 g/dL (ref 3.5–5.0)
Alkaline Phosphatase: 44 U/L (ref 38–126)
Anion gap: 10 (ref 5–15)
BUN: 15 mg/dL (ref 8–23)
CO2: 25 mmol/L (ref 22–32)
Calcium: 9.1 mg/dL (ref 8.9–10.3)
Chloride: 95 mmol/L — ABNORMAL LOW (ref 98–111)
Creatinine, Ser: 1.02 mg/dL (ref 0.61–1.24)
GFR calc Af Amer: >60 mL/min (ref >60)
GFR calc non Af Amer: >60 mL/min (ref >60)
Glucose, Bld: 125 mg/dL — ABNORMAL HIGH (ref 70–99)
Potassium: 4.3 mmol/L (ref 3.5–5.1)
Sodium: 130 mmol/L — ABNORMAL LOW (ref 135–145)
Total Bilirubin: 0.9 mg/dL (ref 0.3–1.2)
Total Protein: 6.6 g/dL (ref 6.5–8.1)

## 2019-06-15 LAB — CBC
HCT: 40.3 % (ref 39.0–52.0)
Hemoglobin: 13.6 g/dL (ref 13.0–17.0)
MCH: 30.6 pg (ref 26.0–34.0)
MCHC: 33.7 g/dL (ref 30.0–36.0)
MCV: 90.8 fL (ref 80.0–100.0)
Platelets: 254 10*3/uL (ref 150–400)
RBC: 4.44 MIL/uL (ref 4.22–5.81)
RDW: 12.7 % (ref 11.5–15.5)
WBC: 5.1 10*3/uL (ref 4.0–10.5)
nRBC: 0 % (ref 0.0–0.2)

## 2019-06-15 LAB — PROTEIN, CSF 14-3-3 (PRION DISEASE)
14-3-3 PROTEIN (CSF)++: POSITIVE — AB
EST PROB PRION DIS IN PATIENT: <1 %
RT-QUIC (CSF)*: NEGATIVE
T-TAU PROTEIN (CSF)++: 2029 pg/ml — ABNORMAL HIGH (ref 0–1149)

## 2019-06-15 LAB — BETA-AMYLOID 42/40 RATIO,CSF
ABETA 40: 18903 pg/mL
ABETA 42/40 Ratio: 0.2 — ABNORMAL LOW
ABETA 42: 3825 pg/mL

## 2019-06-15 LAB — SARS CORONAVIRUS 2 BY RT PCR (HOSPITAL ORDER, PERFORMED IN ~~LOC~~ HOSPITAL LAB): SARS Coronavirus 2: NEGATIVE

## 2019-06-15 LAB — DIFFERENTIAL
Abs Immature Granulocytes: 0.01 10*3/uL (ref 0.00–0.07)
Basophils Absolute: 0.1 10*3/uL (ref 0.0–0.1)
Basophils Relative: 1 %
Eosinophils Absolute: 0.1 10*3/uL (ref 0.0–0.5)
Eosinophils Relative: 2 %
Immature Granulocytes: 0 %
Lymphocytes Relative: 24 %
Lymphs Abs: 1.2 10*3/uL (ref 0.7–4.0)
Monocytes Absolute: 0.5 10*3/uL (ref 0.1–1.0)
Monocytes Relative: 10 %
Neutro Abs: 3.2 10*3/uL (ref 1.7–7.7)
Neutrophils Relative %: 63 %

## 2019-06-15 LAB — CSF CULTURE W GRAM STAIN
MICRO NUMBER:: 918127
Result:: NO GROWTH
SPECIMEN QUALITY:: ADEQUATE

## 2019-06-15 LAB — I-STAT CHEM 8, ED
BUN: 16 mg/dL (ref 8–23)
Calcium, Ion: 1.18 mmol/L (ref 1.15–1.40)
Chloride: 94 mmol/L — ABNORMAL LOW (ref 98–111)
Creatinine, Ser: 1 mg/dL (ref 0.61–1.24)
Glucose, Bld: 120 mg/dL — ABNORMAL HIGH (ref 70–99)
HCT: 41 % (ref 39.0–52.0)
Hemoglobin: 13.9 g/dL (ref 13.0–17.0)
Potassium: 4.3 mmol/L (ref 3.5–5.1)
Sodium: 129 mmol/L — ABNORMAL LOW (ref 135–145)
TCO2: 25 mmol/L (ref 22–32)

## 2019-06-15 LAB — PROTEIN, CSF: Total Protein, CSF: 51 mg/dL (ref 15–60)

## 2019-06-15 LAB — GLUCOSE, CSF: Glucose, CSF: 64 mg/dL (ref 40–80)

## 2019-06-15 LAB — OLIGOCLONAL BANDS, CSF + SERM

## 2019-06-15 LAB — APTT: aPTT: 30 seconds (ref 24–36)

## 2019-06-15 MED ORDER — ACETAMINOPHEN 160 MG/5ML PO SOLN: 650.0000 mg | ORAL | Status: DC | PRN

## 2019-06-15 MED ORDER — SODIUM CHLORIDE 0.9 % IV SOLN
50.0000 mL | Freq: Once | INTRAVENOUS | Status: AC
  Administered 2019-06-15: 50 mL via INTRAVENOUS

## 2019-06-15 MED ORDER — PANTOPRAZOLE SODIUM 40 MG IV SOLR
40.0000 mg | Freq: Every day | INTRAVENOUS | Status: DC
  Administered 2019-06-15 – 2019-06-16 (×2): 40 mg via INTRAVENOUS
  Filled 2019-06-15: qty 40

## 2019-06-15 MED ORDER — IOHEXOL 350 MG/ML SOLN
75.0000 mL | Freq: Once | INTRAVENOUS | Status: AC | PRN
  Administered 2019-06-15: 75 mL via INTRAVENOUS

## 2019-06-15 MED ORDER — SENNOSIDES-DOCUSATE SODIUM 8.6-50 MG PO TABS: 1.0000 | ORAL_TABLET | Freq: Every evening | ORAL | Status: DC | PRN

## 2019-06-15 MED ORDER — CHLORHEXIDINE GLUCONATE CLOTH 2 % EX PADS
6.0000 | MEDICATED_PAD | Freq: Every day | CUTANEOUS | Status: DC
  Administered 2019-06-15: 6 via TOPICAL

## 2019-06-15 MED ORDER — ALTEPLASE (STROKE) FULL DOSE INFUSION
0.9000 mg/kg | Freq: Once | INTRAVENOUS | Status: AC
  Administered 2019-06-15: 64.1 mg via INTRAVENOUS
  Filled 2019-06-15: qty 100

## 2019-06-15 MED ORDER — ACETAMINOPHEN 650 MG RE SUPP: 650.0000 mg | RECTAL | Status: DC | PRN

## 2019-06-15 MED ORDER — ACETAMINOPHEN 325 MG PO TABS: 650.0000 mg | ORAL_TABLET | ORAL | Status: DC | PRN

## 2019-06-15 MED ORDER — STROKE: EARLY STAGES OF RECOVERY BOOK
Freq: Once | Status: AC
  Administered 2019-06-16: 21:00:00
  Filled 2019-06-15: qty 1

## 2019-06-15 MED ORDER — SODIUM CHLORIDE 0.9 % IV SOLN
INTRAVENOUS | Status: DC
  Administered 2019-06-15 – 2019-06-17 (×2): via INTRAVENOUS

## 2019-06-15 NOTE — ED Provider Notes (Signed)
[facility] EMERGENCY DEPARTMENT Provider Note   CSN: [id number] Arrival date & time: 06/15/19  1635  An emergency department physician performed an initial assessment on this suspected stroke patient at 1643.  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     The history is provided by the patient, the spouse and medical records. No language interpreter was used.   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 He presents to the emergency department for evaluation of sudden onset difficulty with speaking and slurred speech that began around 4 PM today. This occurred while he and his wife were on a walk. He does have a history of progressive neurologic issues over the last several months that were primarily related to memory issues as well as issues with perception of direction and movement. Otherwise he was doing well recently when he suddenly had difficulty with speech and garbled sounding speech. Level V caveat due to altered mental status.  Past Medical History:  Diagnosis Date   Allergy    Arthritis    Cancer ([place])    Cataract    Chronic kidney disease    atrophic left kidney   Coronary atherosclerosis    a. 2012 - incidentally noted on CT scan of chest;  b. 08/2011 Myoview: normal; c. 03/2015 CPX @ [facility]: normal w/ PAC's, PVC's, and a 3 beat run of NSVT in recovery.   Heart murmur    asymptomatic function murmur.   History of echocardiogram    a. 12/2004 Echo: Nl EF, trace TR/MR.   Hx of radiation therapy    Hyperlipidemia    Personal history of malignant neoplasm of prostate    a. 2008 s/p prostatecomtomy-->radiation-->currently on Lupron injections.   Sinus bradycardia    Syncope    a. 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 Authorizing Provider  aspirin EC 81 MG tablet Take 81 mg by mouth daily.   Yes [provider]  ezetimibe (ZETIA) 10 MG tablet Take 10 mg by mouth daily. 03/10/19  Yes [provider]  leuprolide (LUPRON) 30 MG injection Inject 30 mg into the muscle See admin instructions. Inject 30 mg IM every 6 months   Yes [provider]  Misc Natural Products (OSTEO BI-FLEX ADV JOINT SHIELD PO) Take 1 tablet by mouth daily.    Yes [provider]  Multiple Vitamins-Minerals (CENTRUM SILVER PO) Take 1 tablet by mouth daily.   Yes [provider]  ibuprofen (ADVIL,MOTRIN) 400 MG tablet Take 1 tablet (400 mg total) by mouth every 4 (four) hours as needed for mild pain or moderate pain. Patient not taking: Reported on 06/15/2019 12/27/16   Jani Gravel, MD    Family History Family History  Problem Relation Age of Onset   Hypertension Mother    Thyroid disease Mother    Stroke  Father    Coronary artery disease Father    Colon cancer Neg Hx    Esophageal cancer Neg Hx    Pancreatic cancer Neg Hx    Rectal cancer Neg Hx    Stomach cancer Neg Hx     Social History Social History   Tobacco Use   Smoking status: Never Smoker   Smokeless tobacco: Never Used  Substance Use Topics   Alcohol use: Yes    Alcohol/week: 1.0 standard drinks    Types: 1 Cans of beer per week    Comment: occ   Drug use: No     Allergies   Atorvastatin   Review of Systems Review of Systems  All other systems reviewed and are negative.    Physical Exam Updated Vital Signs BP (!) 152/88    Pulse (!) 161    Temp 98 F (36.7 C) (Axillary)    Resp 16    Ht 5\' 9"  (1.753 m)    Wt 69.5 kg    SpO2 95%    BMI 22.63 kg/m   Physical Exam Vitals signs and nursing note reviewed.  Constitutional:      Appearance: He is well-developed.  HENT:     Head: Normocephalic and atraumatic.  Cardiovascular:     Rate and Rhythm: Normal rate and regular rhythm.  Pulmonary:     Effort: Pulmonary effort is normal. No respiratory distress.  Abdominal:     Palpations: Abdomen is soft.     Tenderness: There is no abdominal tenderness. There is no guarding or rebound.  Musculoskeletal:        General: No swelling or tenderness.  Skin:    General: Skin is warm and dry.     Capillary Refill: Capillary refill takes less than 2 seconds.  Neurological:     Mental Status: He is alert.     Comments: Confused.  Expressive and receptive aphasia.  Slurred speech.  No asymmetry of facial movements. Generalized weakness.    Psychiatric:        Behavior: Behavior normal.      ED Treatments / Results  Labs (all labs ordered are listed, but only abnormal results are displayed) Labs Reviewed  COMPREHENSIVE METABOLIC PANEL - Abnormal; Notable for the following components:      Result Value   Sodium 130 (*)    Chloride 95 (*)    Glucose, Bld 125 (*)    All other components within normal  limits  I-STAT CHEM 8, ED - Abnormal; Notable for the following components:   Sodium 129 (*)    Chloride 94 (*)    Glucose, Bld 120 (*)    All other components within normal limits  SARS CORONAVIRUS 2 BY RT PCR (HOSPITAL ORDER, [place] [facility])  MRSA PCR SCREENING  PROTIME-INR  APTT  CBC  DIFFERENTIAL  RAPID  URINE DRUG SCREEN, HOSP PERFORMED  URINALYSIS, ROUTINE W REFLEX MICROSCOPIC  HEMOGLOBIN A1C  LIPID PANEL  ETHANOL    EKG EKG Interpretation  Date/Time:  Tuesday June 15 2019 17:24:09 EDT Ventricular Rate:  66 PR Interval:    QRS Duration: 81 QT Interval:  376 QTC Calculation: 394 R Axis:   65 Text Interpretation:  Sinus rhythm Confirmed by Quintella Reichert [phone]) on 06/15/2019 5:28:33 PM   Radiology Ct Angio Head W Or Wo Contrast  Result Date: 06/15/2019 CLINICAL DATA:  Acute presentation with slurred speech and aphasia. EXAM: CT ANGIOGRAPHY HEAD AND NECK TECHNIQUE: Multidetector CT imaging of the head and neck was performed using the standard protocol during bolus administration of intravenous contrast. Multiplanar CT image reconstructions and MIPs were obtained to evaluate the vascular anatomy. Carotid stenosis measurements (when applicable) are obtained utilizing NASCET criteria, using the distal internal carotid diameter as the denominator. CONTRAST:  2mL OMNIPAQUE IOHEXOL 350 MG/ML SOLN COMPARISON:  Head CT earlier same day. Multiple previous neuroimaging studies. FINDINGS: CTA NECK FINDINGS Aortic arch: Aortic atherosclerosis and tortuosity. No brachiocephalic vessel origin stenosis. Right carotid system: Common carotid artery is widely patent to the bifurcation. No atherosclerotic disease at the bifurcation. Cervical ICA is tortuous but otherwise normal. Left carotid system: Common carotid artery is widely patent to the bifurcation. No atherosclerotic disease at the bifurcation. Cervical ICA is tortuous but otherwise normal. Vertebral arteries:  Both vertebral arteries are patent at their origins and through the cervical region to the foramen magnum. Skeleton: Ordinary mild cervical spondylosis. Other neck: No mass or adenopathy. Upper chest: Minimal pleural and parenchymal scarring at the apices. Review of the MIP images confirms the above findings CTA HEAD FINDINGS Anterior circulation: Both internal carotid arteries are patent through the skull base and siphon regions. Ordinary siphon atherosclerotic calcification without stenosis. The anterior and middle cerebral vessels are patent without proximal stenosis, aneurysm or vascular malformation. Fetal origin left PCA. No large or medium vessel occlusion identified. Posterior circulation: Both vertebral arteries are widely patent to the basilar. No basilar stenosis. Posterior circulation branch vessels appear normal. Venous sinuses: Patent and normal. Anatomic variants: None significant. Review of the MIP images confirms the above findings IMPRESSION: No large or medium vessel occlusion. No atherosclerotic disease at the carotid bifurcations. Ordinary nonstenotic calcification in the carotid siphon regions. Electronically Signed   By: Nelson Chimes M.D.   On: 06/15/2019 17:27   Ct Angio Neck W Or Wo Contrast  Result Date: 06/15/2019 CLINICAL DATA:  Acute presentation with slurred speech and aphasia. EXAM: CT ANGIOGRAPHY HEAD AND NECK TECHNIQUE: Multidetector CT imaging of the head and neck was performed using the standard protocol during bolus administration of intravenous contrast. Multiplanar CT image reconstructions and MIPs were obtained to evaluate the vascular anatomy. Carotid stenosis measurements (when applicable) are obtained utilizing NASCET criteria, using the distal internal carotid diameter as the denominator. CONTRAST:  100mL OMNIPAQUE IOHEXOL 350 MG/ML SOLN COMPARISON:  Head CT earlier same day. Multiple previous neuroimaging studies. FINDINGS: CTA NECK FINDINGS Aortic arch: Aortic  atherosclerosis and tortuosity. No brachiocephalic vessel origin stenosis. Right carotid system: Common carotid artery is widely patent to the bifurcation. No atherosclerotic disease at the bifurcation. Cervical ICA is tortuous but otherwise normal. Left carotid system: Common carotid artery is widely patent to the bifurcation. No atherosclerotic disease at the bifurcation. Cervical ICA is tortuous but otherwise normal. Vertebral arteries: Both vertebral arteries are patent at their origins and through the cervical region to the foramen magnum. Skeleton:  Ordinary mild cervical spondylosis. Other neck: No mass or adenopathy. Upper chest: Minimal pleural and parenchymal scarring at the apices. Review of the MIP images confirms the above findings CTA HEAD FINDINGS Anterior circulation: Both internal carotid arteries are patent through the skull base and siphon regions. Ordinary siphon atherosclerotic calcification without stenosis. The anterior and middle cerebral vessels are patent without proximal stenosis, aneurysm or vascular malformation. Fetal origin left PCA. No large or medium vessel occlusion identified. Posterior circulation: Both vertebral arteries are widely patent to the basilar. No basilar stenosis. Posterior circulation branch vessels appear normal. Venous sinuses: Patent and normal. Anatomic variants: None significant. Review of the MIP images confirms the above findings IMPRESSION: No large or medium vessel occlusion. No atherosclerotic disease at the carotid bifurcations. Ordinary nonstenotic calcification in the carotid siphon regions. Electronically Signed   By: Nelson Chimes M.D.   On: 06/15/2019 17:27   Ct Head Code Stroke Wo Contrast  Result Date: 06/15/2019 CLINICAL DATA:  Code stroke.  Slurred speech. Aphasia. EXAM: CT HEAD WITHOUT CONTRAST TECHNIQUE: Contiguous axial images were obtained from the base of the skull through the vertex without intravenous contrast. COMPARISON:  05/21/2019  FINDINGS: Brain: Generalized atrophy. Mild to moderate chronic small-vessel ischemic changes of the white matter. No sign of acute infarction, mass lesion, hemorrhage, hydrocephalus or extra-axial collection. Vascular: There is atherosclerotic calcification of the major vessels at the base of the brain. Skull: Negative Sinuses/Orbits: Clear/normal Other: None ASPECTS ([place] Stroke Program Early CT Score) - Ganglionic level infarction (caudate, lentiform nuclei, internal capsule, insula, M1-M3 cortex): 7 - Supraganglionic infarction (M4-M6 cortex): 3 Total score (0-10 with 10 being normal): 10 IMPRESSION: No acute finding by CT. Atrophy and chronic small-vessel ischemic changes of the white matter. *ASPECTS is 10. * These results were communicated to Dr. Leonie Man at 4:56 pmon 10/13/2020by text page via the [facility] messaging system. Electronically Signed   By: Nelson Chimes M.D.   On: 06/15/2019 16:58    Procedures Procedures (including critical care time)  Medications Ordered in ED Medications   stroke: mapping our early stages of recovery book (has no administration in time range)  0.9 %  sodium chloride infusion ( Intravenous Rate/Dose Verify 06/15/19 2000)  acetaminophen (TYLENOL) tablet 650 mg (has no administration in time range)    Or  acetaminophen (TYLENOL) solution 650 mg (has no administration in time range)    Or  acetaminophen (TYLENOL) suppository 650 mg (has no administration in time range)  senna-docusate (Senokot-S) tablet 1 tablet (has no administration in time range)  pantoprazole (PROTONIX) injection 40 mg (has no administration in time range)  Chlorhexidine Gluconate Cloth 2 % PADS 6 each (6 each Topical Given 06/15/19 1842)  alteplase (ACTIVASE) 1 mg/mL infusion 64.1 mg (0 mg Intravenous Stopped 06/15/19 1841)    Followed by  0.9 %  sodium chloride infusion (50 mLs Intravenous Bolus from Bag 06/15/19 1843)  iohexol (OMNIPAQUE) 350 MG/ML injection 75 mL (75 mLs Intravenous  Contrast Given 06/15/19 1703)     Initial Impression / Assessment and Plan / ED Course  I have reviewed the triage vital signs and the nursing notes.  Pertinent labs & imaging results that were available during my care of the patient were reviewed by me and considered in my medical decision making (see chart for details).        Patient here for evaluation of sudden onset expressive aphasia, confusion. He was seen by neurology quickly after patient's ED presentation. TPA administered after neurology evaluation and  discussion with family. Plan to admit for further evaluation and management to the neurology service.  Final Clinical Impressions(s) / ED Diagnoses   Final diagnoses:  None    ED Discharge Orders    None       Quintella Reichert, MD 06/16/19 0109

## 2019-06-15 NOTE — ED Notes (Signed)
TPA started at 1700

## 2019-06-15 NOTE — Code Documentation (Signed)
He was out walking with his wife this afternoon were out for a walk when he suddenly had trouble speaking.  LKW at 1600.  He arrived via private vehicle at 1635.  Code stroke called at 1642  Stat head ct done.  2 PIVs placed.  Labs drawn.  Dr Leonie Man at bedside to assess patient,  NIHSS 8  Missed questions, no blink to threat,  Aphasia.  TPA started at 1700 after Dr Leonie Man spoke with pts wife.  CTA head and neck done. Plan admit to ICU.

## 2019-06-15 NOTE — ED Notes (Signed)
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 No   Triage Complete: Triage complete  Chief Complaint Slurred Speech; Facial Droop (LSN 0400P)  Triage Note Per Wife, pt was out walking with her at 1600 acting his norm and had sudden onset of aphasia, slurred speech, and difficulty understanding questions. Pt taken straight to see Dr Ralene Bathe and then to CT. Code stroke activated.    Allergies Allergies  Allergen Reactions  . Atorvastatin Other (See Comments)    Caused confusion    Level of Care/Admitting Diagnosis ED Disposition    ED Disposition Condition [facility]: [place] [100100]  Level of Care: ICU [6]  Covid Evaluation: Asymptomatic Screening Protocol (No Symptoms)  Diagnosis: Stroke (cerebrum) ([place]) [id number]  Admitting Physician: Sadie Haber  Attending Physician: Sadie Haber  Estimated length of stay: 3 - 4 days  Certification:: I certify this patient will need inpatient services for at least 2 midnights  PT Class (Do Not Modify): Inpatient [101]  PT Acc Code (Do Not Modify): Private [1]       B Medical/Surgery History Past Medical History:  Diagnosis Date  . Allergy   . Arthritis   . Cancer ([place])   . Cataract   . Chronic kidney disease    atrophic left kidney  . Coronary atherosclerosis    a. 2012 - incidentally noted on CT scan of chest;  b. 08/2011 Myoview: normal; c. 03/2015 CPX @ [facility]: normal w/ PAC's, PVC's, and a 3 beat run of NSVT in recovery.  Marland Kitchen Heart murmur    asymptomatic function murmur.  Marland Kitchen History of echocardiogram    a. 12/2004 Echo: Nl EF, trace TR/MR.  Marland Kitchen Hx of radiation therapy   . Hyperlipidemia   . Personal history of malignant neoplasm of prostate    a. 2008 s/p  prostatecomtomy-->radiation-->currently on Lupron injections.  . Sinus bradycardia   . Syncope    a. 12/2016   Past Surgical History:  Procedure Laterality Date  . PROSTATECTOMY    . ROBOT ASSISTED LAPAROSCOPIC RADICAL PROSTATECTOMY    . SHOULDER SURGERY Right   . SKIN GRAFT Left   . TONSILLECTOMY       A IV Location/Drains/Wounds Patient Lines/Drains/Airways Status   Active Line/Drains/Airways    Name:   Placement date:   Placement time:   Site:   Days:   Peripheral IV 01/31/19 Left Antecubital   01/31/19    0926    Antecubital   135   Peripheral IV 06/15/19 Left Antecubital   06/15/19    1659    Antecubital   less than 1   Peripheral IV 06/15/19 Right Antecubital   06/15/19    1650    Antecubital   less than 1          Intake/Output Last 24 hours No intake or output data in the 24 hours ending 06/15/19 1806  Labs/Imaging Results for orders placed or performed during the hospital encounter of 06/15/19 (from the past 48 hour(s))  Protime-INR     Status: None   Collection Time: 06/15/19  4:46 PM  Result Value Ref Range   Prothrombin Time 13.5 11.4 - 15.2 seconds   INR 1.0 0.8 - 1.2    Comment: (  NOTE) INR goal varies based on device and disease states. Performed at [facility], [place] [address] [place], [place] [address]   APTT     Status: None   Collection Time: 06/15/19  4:46 PM  Result Value Ref Range   aPTT 30 24 - 36 seconds    Comment: Performed at [facility] [address], [place], [place] [address]  CBC     Status: None   Collection Time: 06/15/19  4:46 PM  Result Value Ref Range   WBC 5.1 4.0 - 10.5 K/uL   RBC 4.44 4.22 - 5.81 MIL/uL   Hemoglobin 13.6 13.0 - 17.0 g/dL   HCT 40.3 39.0 - 52.0 %   MCV 90.8 80.0 - 100.0 fL   MCH 30.6 26.0 - 34.0 pg   MCHC 33.7 30.0 - 36.0 g/dL   RDW 12.7 11.5 - 15.5 %   Platelets 254 150 - 400 K/uL   nRBC 0.0 0.0 - 0.2 %    Comment: Performed at [facility], [place] [address] [place], [place]  [address]  Differential     Status: None   Collection Time: 06/15/19  4:46 PM  Result Value Ref Range   Neutrophils Relative % 63 %   Neutro Abs 3.2 1.7 - 7.7 K/uL   Lymphocytes Relative 24 %   Lymphs Abs 1.2 0.7 - 4.0 K/uL   Monocytes Relative 10 %   Monocytes Absolute 0.5 0.1 - 1.0 K/uL   Eosinophils Relative 2 %   Eosinophils Absolute 0.1 0.0 - 0.5 K/uL   Basophils Relative 1 %   Basophils Absolute 0.1 0.0 - 0.1 K/uL   Immature Granulocytes 0 %   Abs Immature Granulocytes 0.01 0.00 - 0.07 K/uL    Comment: Performed at [facility] [address] [place], [place] [address]  Comprehensive metabolic panel     Status: Abnormal   Collection Time: 06/15/19  4:46 PM  Result Value Ref Range   Sodium 130 (L) 135 - 145 mmol/L   Potassium 4.3 3.5 - 5.1 mmol/L   Chloride 95 (L) 98 - 111 mmol/L   CO2 25 22 - 32 mmol/L   Glucose, Bld 125 (H) 70 - 99 mg/dL   BUN 15 8 - 23 mg/dL   Creatinine, Ser 1.02 0.61 - 1.24 mg/dL   Calcium 9.1 8.9 - 10.3 mg/dL   Total Protein 6.6 6.5 - 8.1 g/dL   Albumin 4.2 3.5 - 5.0 g/dL   AST 26 15 - 41 U/L   ALT 17 0 - 44 U/L   Alkaline Phosphatase 44 38 - 126 U/L   Total Bilirubin 0.9 0.3 - 1.2 mg/dL   GFR calc non Af Amer >60 >60 mL/min   GFR calc Af Amer >60 >60 mL/min   Anion gap 10 5 - 15    Comment: Performed at [facility], [place] [address], [place], [place] [address]  I-stat chem 8, ED     Status: Abnormal   Collection Time: 06/15/19  4:59 PM  Result Value Ref Range   Sodium 129 (L) 135 - 145 mmol/L   Potassium 4.3 3.5 - 5.1 mmol/L   Chloride 94 (L) 98 - 111 mmol/L   BUN 16 8 - 23 mg/dL   Creatinine, Ser 1.00 0.61 - 1.24 mg/dL   Glucose, Bld 120 (H) 70 - 99 mg/dL   Calcium, Ion 1.18 1.15 - 1.40 mmol/L   TCO2 25 22 - 32 mmol/L  Hemoglobin 13.9 13.0 - 17.0 g/dL   HCT 41.0 39.0 - 52.0 %   Ct Angio Head W Or Wo Contrast  Result Date: 06/15/2019 CLINICAL DATA:  Acute presentation with slurred speech and aphasia. EXAM: CT ANGIOGRAPHY  HEAD AND NECK TECHNIQUE: Multidetector CT imaging of the head and neck was performed using the standard protocol during bolus administration of intravenous contrast. Multiplanar CT image reconstructions and MIPs were obtained to evaluate the vascular anatomy. Carotid stenosis measurements (when applicable) are obtained utilizing NASCET criteria, using the distal internal carotid diameter as the denominator. CONTRAST:  54mL OMNIPAQUE IOHEXOL 350 MG/ML SOLN COMPARISON:  Head CT earlier same day. Multiple previous neuroimaging studies. FINDINGS: CTA NECK FINDINGS Aortic arch: Aortic atherosclerosis and tortuosity. No brachiocephalic vessel origin stenosis. Right carotid system: Common carotid artery is widely patent to the bifurcation. No atherosclerotic disease at the bifurcation. Cervical ICA is tortuous but otherwise normal. Left carotid system: Common carotid artery is widely patent to the bifurcation. No atherosclerotic disease at the bifurcation. Cervical ICA is tortuous but otherwise normal. Vertebral arteries: Both vertebral arteries are patent at their origins and through the cervical region to the foramen magnum. Skeleton: Ordinary mild cervical spondylosis. Other neck: No mass or adenopathy. Upper chest: Minimal pleural and parenchymal scarring at the apices. Review of the MIP images confirms the above findings CTA HEAD FINDINGS Anterior circulation: Both internal carotid arteries are patent through the skull base and siphon regions. Ordinary siphon atherosclerotic calcification without stenosis. The anterior and middle cerebral vessels are patent without proximal stenosis, aneurysm or vascular malformation. Fetal origin left PCA. No large or medium vessel occlusion identified. Posterior circulation: Both vertebral arteries are widely patent to the basilar. No basilar stenosis. Posterior circulation branch vessels appear normal. Venous sinuses: Patent and normal. Anatomic variants: None significant. Review  of the MIP images confirms the above findings IMPRESSION: No large or medium vessel occlusion. No atherosclerotic disease at the carotid bifurcations. Ordinary nonstenotic calcification in the carotid siphon regions. Electronically Signed   By: Nelson Chimes M.D.   On: 06/15/2019 17:27   Ct Angio Neck W Or Wo Contrast  Result Date: 06/15/2019 CLINICAL DATA:  Acute presentation with slurred speech and aphasia. EXAM: CT ANGIOGRAPHY HEAD AND NECK TECHNIQUE: Multidetector CT imaging of the head and neck was performed using the standard protocol during bolus administration of intravenous contrast. Multiplanar CT image reconstructions and MIPs were obtained to evaluate the vascular anatomy. Carotid stenosis measurements (when applicable) are obtained utilizing NASCET criteria, using the distal internal carotid diameter as the denominator. CONTRAST:  3mL OMNIPAQUE IOHEXOL 350 MG/ML SOLN COMPARISON:  Head CT earlier same day. Multiple previous neuroimaging studies. FINDINGS: CTA NECK FINDINGS Aortic arch: Aortic atherosclerosis and tortuosity. No brachiocephalic vessel origin stenosis. Right carotid system: Common carotid artery is widely patent to the bifurcation. No atherosclerotic disease at the bifurcation. Cervical ICA is tortuous but otherwise normal. Left carotid system: Common carotid artery is widely patent to the bifurcation. No atherosclerotic disease at the bifurcation. Cervical ICA is tortuous but otherwise normal. Vertebral arteries: Both vertebral arteries are patent at their origins and through the cervical region to the foramen magnum. Skeleton: Ordinary mild cervical spondylosis. Other neck: No mass or adenopathy. Upper chest: Minimal pleural and parenchymal scarring at the apices. Review of the MIP images confirms the above findings CTA HEAD FINDINGS Anterior circulation: Both internal carotid arteries are patent through the skull base and siphon regions. Ordinary siphon atherosclerotic calcification  without stenosis. The anterior and middle cerebral  vessels are patent without proximal stenosis, aneurysm or vascular malformation. Fetal origin left PCA. No large or medium vessel occlusion identified. Posterior circulation: Both vertebral arteries are widely patent to the basilar. No basilar stenosis. Posterior circulation branch vessels appear normal. Venous sinuses: Patent and normal. Anatomic variants: None significant. Review of the MIP images confirms the above findings IMPRESSION: No large or medium vessel occlusion. No atherosclerotic disease at the carotid bifurcations. Ordinary nonstenotic calcification in the carotid siphon regions. Electronically Signed   By: Nelson Chimes M.D.   On: 06/15/2019 17:27   Ct Head Code Stroke Wo Contrast  Result Date: 06/15/2019 CLINICAL DATA:  Code stroke.  Slurred speech. Aphasia. EXAM: CT HEAD WITHOUT CONTRAST TECHNIQUE: Contiguous axial images were obtained from the base of the skull through the vertex without intravenous contrast. COMPARISON:  05/21/2019 FINDINGS: Brain: Generalized atrophy. Mild to moderate chronic small-vessel ischemic changes of the white matter. No sign of acute infarction, mass lesion, hemorrhage, hydrocephalus or extra-axial collection. Vascular: There is atherosclerotic calcification of the major vessels at the base of the brain. Skull: Negative Sinuses/Orbits: Clear/normal Other: None ASPECTS ([place] Stroke Program Early CT Score) - Ganglionic level infarction (caudate, lentiform nuclei, internal capsule, insula, M1-M3 cortex): 7 - Supraganglionic infarction (M4-M6 cortex): 3 Total score (0-10 with 10 being normal): 10 IMPRESSION: No acute finding by CT. Atrophy and chronic small-vessel ischemic changes of the white matter. *ASPECTS is 10. * These results were communicated to Dr. Leonie Man at 4:56 pmon 10/13/2020by text page via the [facility] messaging system. Electronically Signed   By: Nelson Chimes M.D.   On: 06/15/2019 16:58    Pending  Labs Unresulted Labs (From admission, onward)    Start     Ordered   06/16/19 0500  Hemoglobin A1c  Tomorrow morning,   R     06/15/19 1735   06/16/19 0500  Lipid panel  Tomorrow morning,   R    Comments: Fasting    06/15/19 1735   06/15/19 1705  SARS Coronavirus 2 by RT PCR (hospital order, performed in [facility] hospital lab) Nasopharyngeal Nasopharyngeal Swab  (Symptomatic/High Risk of Exposure/Tier 1 Patients Labs with Precautions)  Once,   STAT    Question Answer Comment  Is this test for diagnosis or screening Screening   Symptomatic for COVID-19 as defined by CDC No   Hospitalized for COVID-19 No   Admitted to ICU for COVID-19 No   Previously tested for COVID-19 Yes   Resident in a congregate (group) care setting No   Employed in healthcare setting No      06/15/19 1704   06/15/19 1646  Ethanol  ONCE - STAT,   STAT     06/15/19 1645   06/15/19 1646  Urine rapid drug screen (hosp performed)  ONCE - STAT,   STAT     06/15/19 1645   06/15/19 1646  Urinalysis, Routine w reflex microscopic  ONCE - STAT,   STAT     06/15/19 1645          Vitals/Pain Today's Vitals   06/15/19 1730 06/15/19 1747 06/15/19 1750 06/15/19 1800  BP: (!) 149/89 (!) 141/84  (!) 150/88  Pulse: 64 65  61  Resp: 20 18  18   SpO2: 100% 96%  100%  Weight:      Height:      PainSc:   0-No pain     Isolation Precautions Airborne and Contact precautions  Medications Medications  alteplase (ACTIVASE) 1 mg/mL infusion 64.1 mg (64.1  mg Intravenous New Bag/Given 06/15/19 1700)    Followed by  0.9 %  sodium chloride infusion (has no administration in time range)   stroke: mapping our early stages of recovery book (has no administration in time range)  0.9 %  sodium chloride infusion (has no administration in time range)  acetaminophen (TYLENOL) tablet 650 mg (has no administration in time range)    Or  acetaminophen (TYLENOL) solution 650 mg (has no administration in time range)    Or   acetaminophen (TYLENOL) suppository 650 mg (has no administration in time range)  senna-docusate (Senokot-S) tablet 1 tablet (has no administration in time range)  pantoprazole (PROTONIX) injection 40 mg (has no administration in time range)  iohexol (OMNIPAQUE) 350 MG/ML injection 75 mL (75 mLs Intravenous Contrast Given 06/15/19 1703)    Mobility walks High fall risk   Focused Assessments Cardiac Assessment Handoff:  Cardiac Rhythm: Normal sinus rhythm Lab Results  Component Value Date   TROPONINI <0.03 12/27/2016   No results found for: DDIMER Does the Patient currently have chest pain? No  , Neuro Assessment Handoff:  Swallow screen pass? Yes  Cardiac Rhythm: Normal sinus rhythm NIH Stroke Scale ( + Modified Stroke Scale Criteria)  Interval: Initial Level of Consciousness (1a.)   : Alert, keenly responsive LOC Questions (1b. )   +: Answers neither question correctly LOC Commands (1c. )   + : Performs both tasks correctly Best Gaze (2. )  +: Normal Visual (3. )  +: No visual loss Facial Palsy (4. )    : Normal symmetrical movements Motor Arm, Left (5a. )   +: No drift Motor Arm, Right (5b. )   +: No drift Motor Leg, Left (6a. )   +: No drift Motor Leg, Right (6b. )   +: No drift Limb Ataxia (7. ): Absent Sensory (8. )   +: Normal, no sensory loss Best Language (9. )   +: Severe aphasia Dysarthria (10. ): Mild-to-moderate dysarthria, patient slurs at least some words and, at worst, can be understood with some difficulty Extinction/Inattention (11.)   +: No Abnormality Modified SS Total  +: 4 Complete NIHSS TOTAL: 5 Last date known well: 06/15/19 Last time known well: 1600 Neuro Assessment: Exceptions to WDL Neuro Checks:   Initial (06/15/19 1640)  Last Documented NIHSS Modified Score: 4 (06/15/19 1745) Has TPA been given? Yes BP: 150/88 (10/13 1800) Pulse Rate: 61 (10/13 1800) If patient is a Neuro Trauma and patient is going to OR before floor call report to [facility] nurse: [phone] or [phone]     R Recommendations: See Admitting Provider Note  Report given to: 4N RN  Additional Notes: See triage note and provider note

## 2019-06-15 NOTE — Progress Notes (Signed)
EEG complete - results pending. Same leads will be used in LTM

## 2019-06-15 NOTE — Progress Notes (Signed)
LTM EEG hooked up and running - no initial skin breakdown - push button tested - neuro notified. Same leads use from routine

## 2019-06-15 NOTE — ED Triage Notes (Addendum)
Per Wife, pt was out walking with her at 1600 acting his norm and had sudden onset of aphasia, slurred speech, and difficulty understanding questions. Pt taken straight to see Dr Ralene Bathe and then to CT. Code stroke activated.

## 2019-06-15 NOTE — Progress Notes (Signed)
PHARMACIST CODE STROKE RESPONSE  Notified to mix tPA at 1650 by Dr. Leonie Man Delivered tPA to RN at 1655  tPA dose = 6.4 mg bolus over 1 minute followed by 57.7 mg for a total dose of 64.1 mg over 1 hour  Issues/delays encountered (if applicable): none  Sherren Kerns, PharmD PGY1 Acute Care Pharmacy Resident 06/15/19 5:02 PM

## 2019-06-15 NOTE — Progress Notes (Signed)
Spoke with patients wife states that the patient is a DNR, paperwork is at home. Paged oncall neurology to have code status changed. Will continue to monitor. Lianne Bushy RN BSN.

## 2019-06-15 NOTE — ED Notes (Signed)
Doug ([facility]/Activate Code Stroke) called @ 1640-per Deneise Lever, RN called by Levada Dy

## 2019-06-15 NOTE — H&P (Signed)
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  This occurred while he was walking outside with his wife.  Patient has trouble speaking communicating and some slurred speech since then.  There was no extremity weakness numbness gait difficulty or significant headache.  The patient's wife whom I spoke to in the ER says that he has been having some progressive neurological issues for the last several months for which he was evaluated by Dr. Brett Fairy and has had several scans but no definite etiology has been found.  There is no history of documented seizure or seizure-like episode. He had a metabolic PET scan done on 04/06/2019 which did not suggest any diminished bilateral cortical metabolism to raise concern about Alzheimer's.  A poor protein EEG genotyping was positive for 1 copy of the echo E4 variant which is associated with increased risk of late onset Alzheimer's however ABThera 42/40 ratio was 0.20 which suggest high risk for Alzheimer's.  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  CSF cell count was normal at 4 and protein also normal at 51 mg percent.  Patient's wife states he has had several episodes of transient confusional episodes.  He had neuropsychological test battery which suggested posterior cortical atrophy variant.  His last Mini-Mental status exam score was 25/30 on 06/08/2019 at last visit with Dr. Brett Fairy. LSN: 4 PM on 06/15/2019 tPA Given: Yes NIHSS 8  Past Medical History:  Diagnosis Date  . Allergy   . Arthritis   . Cancer  ([place])   . Cataract   . Chronic kidney disease    atrophic left kidney  . Coronary atherosclerosis    a. 2012 - incidentally noted on CT scan of chest;  b. 08/2011 Myoview: normal; c. 03/2015 CPX @ [facility]: normal w/ PAC's, PVC's, and a 3 beat run of NSVT in recovery.  Marland Kitchen Heart murmur    asymptomatic function murmur.  Marland Kitchen History of echocardiogram    a. 12/2004 Echo: Nl EF, trace TR/MR.  Marland Kitchen Hx of radiation therapy   . Hyperlipidemia   . Personal history of malignant neoplasm of prostate    a. 2008 s/p prostatecomtomy-->radiation-->currently on Lupron injections.  . Sinus bradycardia   . Syncope    a. 12/2016    Past Surgical History:  Procedure Laterality Date  . PROSTATECTOMY    . ROBOT ASSISTED LAPAROSCOPIC RADICAL PROSTATECTOMY    . SHOULDER SURGERY Right   . SKIN GRAFT Left   . TONSILLECTOMY      Family History  Problem Relation Age of Onset  . Hypertension Mother   . Thyroid disease Mother   . Stroke Father   . Coronary artery disease Father   . Colon cancer Neg Hx   . Esophageal cancer Neg Hx   . Pancreatic cancer Neg Hx   . Rectal cancer Neg Hx   . Stomach cancer Neg Hx    Social History:  reports that he has never smoked. He has never used smokeless tobacco. He reports current alcohol use of about 1.0 standard drinks of alcohol per week. He reports  that he does not use drugs.  Allergies:  Allergies  Allergen Reactions  . Atorvastatin Other (See Comments)    Caused confusion    (Not in a hospital admission)   ROS: 14 system review of systems is positive for confusion, speech difficulty, slurred speech and as documented above in history of present illness.  Physical Examination: Blood pressure (!) 163/92, pulse 70, resp. rate 19, height 5\' 9"  (1.753 m), weight 69.5 kg, SpO2 98 %.  Pleasant elderly Caucasian male not in distress. . Afebrile. Head is nontraumatic. Neck is supple without bruit.    Cardiac exam no murmur or gallop. Lungs are clear to auscultation.  Distal pulses are well felt.  Neurologic Examination: Awake alert globally aphasic with significant expressive language difficulties and speaks only occasional words follows simple midline commands and occasionally to two-step commands with cues.  Extraocular movements are full range without nystagmus.  Does not blink to threat on the right but does on the left.  Face is symmetric without weakness.  Tongue midline.  Motor system exam symmetric upper and lower extremity strength without focal weakness.  Sensation and coordination difficult to test due to his mental status.  Reflexes are symmetric.  Plantars are downgoing. NIHSS 8 Results for orders placed or performed during the hospital encounter of 06/15/19 (from the past 48 hour(s))  CBC     Status: None   Collection Time: 06/15/19  4:46 PM  Result Value Ref Range   WBC 5.1 4.0 - 10.5 K/uL   RBC 4.44 4.22 - 5.81 MIL/uL   Hemoglobin 13.6 13.0 - 17.0 g/dL   HCT 40.3 39.0 - 52.0 %   MCV 90.8 80.0 - 100.0 fL   MCH 30.6 26.0 - 34.0 pg   MCHC 33.7 30.0 - 36.0 g/dL   RDW 12.7 11.5 - 15.5 %   Platelets 254 150 - 400 K/uL   nRBC 0.0 0.0 - 0.2 %    Comment: Performed at [facility], [place] [address] [place], [place] [address]  Differential     Status: None   Collection Time: 06/15/19  4:46 PM  Result Value Ref Range   Neutrophils Relative % 63 %   Neutro Abs 3.2 1.7 - 7.7 K/uL   Lymphocytes Relative 24 %   Lymphs Abs 1.2 0.7 - 4.0 K/uL   Monocytes Relative 10 %   Monocytes Absolute 0.5 0.1 - 1.0 K/uL   Eosinophils Relative 2 %   Eosinophils Absolute 0.1 0.0 - 0.5 K/uL   Basophils Relative 1 %   Basophils Absolute 0.1 0.0 - 0.1 K/uL   Immature Granulocytes 0 %   Abs Immature Granulocytes 0.01 0.00 - 0.07 K/uL    Comment: Performed at [facility] [address] [place], [place] [address]  I-stat chem 8, ED     Status: Abnormal   Collection Time: 06/15/19  4:59 PM  Result Value Ref Range   Sodium 129 (L) 135 - 145 mmol/L    Potassium 4.3 3.5 - 5.1 mmol/L   Chloride 94 (L) 98 - 111 mmol/L   BUN 16 8 - 23 mg/dL   Creatinine, Ser 1.00 0.61 - 1.24 mg/dL   Glucose, Bld 120 (H) 70 - 99 mg/dL   Calcium, Ion 1.18 1.15 - 1.40 mmol/L   TCO2 25 22 - 32 mmol/L   Hemoglobin 13.9 13.0 - 17.0 g/dL   HCT 41.0 39.0 - 52.0 %   Ct Angio Head W Or Wo Contrast  Result Date:  06/15/2019 CLINICAL DATA:  Acute presentation with slurred speech and aphasia. EXAM: CT ANGIOGRAPHY HEAD AND NECK TECHNIQUE: Multidetector CT imaging of the head and neck was performed using the standard protocol during bolus administration of intravenous contrast. Multiplanar CT image reconstructions and MIPs were obtained to evaluate the vascular anatomy. Carotid stenosis measurements (when applicable) are obtained utilizing NASCET criteria, using the distal internal carotid diameter as the denominator. CONTRAST:  25mL OMNIPAQUE IOHEXOL 350 MG/ML SOLN COMPARISON:  Head CT earlier same day. Multiple previous neuroimaging studies. FINDINGS: CTA NECK FINDINGS Aortic arch: Aortic atherosclerosis and tortuosity. No brachiocephalic vessel origin stenosis. Right carotid system: Common carotid artery is widely patent to the bifurcation. No atherosclerotic disease at the bifurcation. Cervical ICA is tortuous but otherwise normal. Left carotid system: Common carotid artery is widely patent to the bifurcation. No atherosclerotic disease at the bifurcation. Cervical ICA is tortuous but otherwise normal. Vertebral arteries: Both vertebral arteries are patent at their origins and through the cervical region to the foramen magnum. Skeleton: Ordinary mild cervical spondylosis. Other neck: No mass or adenopathy. Upper chest: Minimal pleural and parenchymal scarring at the apices. Review of the MIP images confirms the above findings CTA HEAD FINDINGS Anterior circulation: Both internal carotid arteries are patent through the skull base and siphon regions. Ordinary siphon atherosclerotic  calcification without stenosis. The anterior and middle cerebral vessels are patent without proximal stenosis, aneurysm or vascular malformation. Fetal origin left PCA. No large or medium vessel occlusion identified. Posterior circulation: Both vertebral arteries are widely patent to the basilar. No basilar stenosis. Posterior circulation branch vessels appear normal. Venous sinuses: Patent and normal. Anatomic variants: None significant. Review of the MIP images confirms the above findings IMPRESSION: No large or medium vessel occlusion. No atherosclerotic disease at the carotid bifurcations. Ordinary nonstenotic calcification in the carotid siphon regions. Electronically Signed   By: Nelson Chimes M.D.   On: 06/15/2019 17:27   Ct Angio Neck W Or Wo Contrast  Result Date: 06/15/2019 CLINICAL DATA:  Acute presentation with slurred speech and aphasia. EXAM: CT ANGIOGRAPHY HEAD AND NECK TECHNIQUE: Multidetector CT imaging of the head and neck was performed using the standard protocol during bolus administration of intravenous contrast. Multiplanar CT image reconstructions and MIPs were obtained to evaluate the vascular anatomy. Carotid stenosis measurements (when applicable) are obtained utilizing NASCET criteria, using the distal internal carotid diameter as the denominator. CONTRAST:  47mL OMNIPAQUE IOHEXOL 350 MG/ML SOLN COMPARISON:  Head CT earlier same day. Multiple previous neuroimaging studies. FINDINGS: CTA NECK FINDINGS Aortic arch: Aortic atherosclerosis and tortuosity. No brachiocephalic vessel origin stenosis. Right carotid system: Common carotid artery is widely patent to the bifurcation. No atherosclerotic disease at the bifurcation. Cervical ICA is tortuous but otherwise normal. Left carotid system: Common carotid artery is widely patent to the bifurcation. No atherosclerotic disease at the bifurcation. Cervical ICA is tortuous but otherwise normal. Vertebral arteries: Both vertebral arteries are  patent at their origins and through the cervical region to the foramen magnum. Skeleton: Ordinary mild cervical spondylosis. Other neck: No mass or adenopathy. Upper chest: Minimal pleural and parenchymal scarring at the apices. Review of the MIP images confirms the above findings CTA HEAD FINDINGS Anterior circulation: Both internal carotid arteries are patent through the skull base and siphon regions. Ordinary siphon atherosclerotic calcification without stenosis. The anterior and middle cerebral vessels are patent without proximal stenosis, aneurysm or vascular malformation. Fetal origin left PCA. No large or medium vessel occlusion identified. Posterior circulation: Both vertebral arteries are  widely patent to the basilar. No basilar stenosis. Posterior circulation branch vessels appear normal. Venous sinuses: Patent and normal. Anatomic variants: None significant. Review of the MIP images confirms the above findings IMPRESSION: No large or medium vessel occlusion. No atherosclerotic disease at the carotid bifurcations. Ordinary nonstenotic calcification in the carotid siphon regions. Electronically Signed   By: Nelson Chimes M.D.   On: 06/15/2019 17:27   Ct Head Code Stroke Wo Contrast  Result Date: 06/15/2019 CLINICAL DATA:  Code stroke.  Slurred speech. Aphasia. EXAM: CT HEAD WITHOUT CONTRAST TECHNIQUE: Contiguous axial images were obtained from the base of the skull through the vertex without intravenous contrast. COMPARISON:  05/21/2019 FINDINGS: Brain: Generalized atrophy. Mild to moderate chronic small-vessel ischemic changes of the white matter. No sign of acute infarction, mass lesion, hemorrhage, hydrocephalus or extra-axial collection. Vascular: There is atherosclerotic calcification of the major vessels at the base of the brain. Skull: Negative Sinuses/Orbits: Clear/normal Other: None ASPECTS ([place] Stroke Program Early CT Score) - Ganglionic level infarction (caudate, lentiform nuclei,  internal capsule, insula, M1-M3 cortex): 7 - Supraganglionic infarction (M4-M6 cortex): 3 Total score (0-10 with 10 being normal): 10 IMPRESSION: No acute finding by CT. Atrophy and chronic small-vessel ischemic changes of the white matter. *ASPECTS is 10. * These results were communicated to Dr. Leonie Man at 4:56 pmon 10/13/2020by text page via the [facility] messaging system. Electronically Signed   By: Nelson Chimes M.D.   On: 06/15/2019 16:58    Assessment: 78 y.o. 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  He has had some recent episodes of transient confusion but no clearly documented seizures or TIAs Due to sudden onset of symptoms and significant focal deficits will give him benefit of doubt and treat with IV TPA.    Plan: I had a long discussion with patient's wife regarding risk benefit of IV TPA and given no obvious contraindications we will give him the benefit of doubt and use IV TPA.  CT angiogram of the brain was obtained emergently which shows no significant large vessel extracranial intracranial stenosis.  Patient will be admitted to the intensive care unit for close neurological monitoring with strict blood pressure control as per post TPA protocol.  Check MRI scan of the brain and EEG.  Check lipid profile, hemoglobin A1c and echocardiogram.  Physical occupational and speech therapy consults.  Check 24-hour post TPA imaging scan later. Discussed with Dr. Ralene Bathe and answered questions. This patient is critically ill and at significant risk of neurological worsening, death and care requires constant monitoring of vital signs, hemodynamics,respiratory and cardiac monitoring, extensive review of multiple databases, frequent  neurological assessment, discussion with family, other specialists and medical decision making of high complexity.I have made any additions or clarifications directly to the above note.This critical care time does not reflect procedure time, or teaching time or supervisory time of PA/NP/Med Resident etc but could involve care discussion time.  I spent 60 minutes of neurocritical care time  in the care of  this patient.     Antony Contras, MD 06/15/2019, 5:36 PM

## 2019-06-16 ENCOUNTER — Inpatient Hospital Stay (HOSPITAL_COMMUNITY): Payer: Medicare Other

## 2019-06-16 ENCOUNTER — Telehealth: Payer: Self-pay | Admitting: Neurology

## 2019-06-16 DIAGNOSIS — I340 Nonrheumatic mitral (valve) insufficiency: Secondary | ICD-10-CM

## 2019-06-16 DIAGNOSIS — R479 Unspecified speech disturbances: Secondary | ICD-10-CM

## 2019-06-16 DIAGNOSIS — I361 Nonrheumatic tricuspid (valve) insufficiency: Secondary | ICD-10-CM

## 2019-06-16 DIAGNOSIS — R410 Disorientation, unspecified: Secondary | ICD-10-CM

## 2019-06-16 LAB — LIPID PANEL
Cholesterol: 172 mg/dL (ref 0–200)
HDL: 58 mg/dL (ref >40)
LDL Cholesterol: 103 mg/dL — ABNORMAL HIGH (ref 0–99)
Total CHOL/HDL Ratio: 3 RATIO
Triglycerides: 56 mg/dL (ref <150)
VLDL: 11 mg/dL (ref 0–40)

## 2019-06-16 LAB — HEMOGLOBIN A1C
Hgb A1c MFr Bld: 5.8 % — ABNORMAL HIGH (ref 4.8–5.6)
Mean Plasma Glucose: 119.76 mg/dL

## 2019-06-16 LAB — MRSA PCR SCREENING: MRSA by PCR: NEGATIVE

## 2019-06-16 LAB — ETHANOL: Alcohol, Ethyl (B): <10 mg/dL (ref <10)

## 2019-06-16 MED ORDER — LORAZEPAM 2 MG/ML IJ SOLN
1.0000 mg | Freq: Once | INTRAMUSCULAR | Status: AC
  Administered 2019-06-17: 1 mg via INTRAVENOUS
  Filled 2019-06-16: qty 1

## 2019-06-16 MED ORDER — LORAZEPAM 2 MG/ML IJ SOLN
INTRAMUSCULAR | Status: AC
  Filled 2019-06-16: qty 1

## 2019-06-16 NOTE — Progress Notes (Signed)
LTM EEG D/C'd. No skin breakdown noted 

## 2019-06-16 NOTE — Evaluation (Signed)
Speech Language Pathology Evaluation Patient Details Name: Spencer Holen, MD MRN: [id number] DOB: June 27, 1941 Today's Date: 06/16/2019 Time: [id number] SLP Time Calculation (min) (ACUTE ONLY): 36 min  Problem List:  Patient Active Problem List   Diagnosis Date Noted  . Stroke (cerebrum) ([place]) 06/15/2019  . 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 Sleep apnea, central 03/26/2019  . Transient ischemic attack (TIA) 01/26/2019  . Transient diplopia 12/20/2018  . Dysesthesia affecting both sides of body 12/20/2018  . 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 Snoring 12/20/2018  . Nocturia more than twice per night 12/20/2018  . Insomnia disorder, with other sleep disorder, recurrent 12/20/2018  . Arthritis   . Cancer ([place])   . Elevated troponin   . Syncope 12/26/2016  . Family history of early CAD 12/26/2016  . Hyperlipidemia 08/15/2011  . PERSONAL HISTORY MALIGNANT NEOPLASM PROSTATE 07/11/2010   Past Medical History:  Past Medical History:  Diagnosis Date  . Allergy   . Arthritis   . Cancer ([place])   . Cataract   . Chronic kidney disease    atrophic left kidney  . Coronary atherosclerosis    a. 2012 - incidentally noted on CT scan of chest;  b. 08/2011 Myoview: normal; c. 03/2015 CPX @ [facility]: normal w/ PAC's, PVC's, and a 3 beat run of NSVT in recovery.  Marland Kitchen Heart murmur    asymptomatic function murmur.  Marland Kitchen History of echocardiogram    a. 12/2004 Echo: Nl EF, trace TR/MR.  Marland Kitchen Hx of radiation therapy   . Hyperlipidemia   . Personal history of malignant neoplasm of prostate    a. 2008 s/p prostatecomtomy-->radiation-->currently on Lupron injections.  . Sinus bradycardia   . Syncope    a. 12/2016   Past Surgical History:  Past Surgical History:  Procedure Laterality Date  . PROSTATECTOMY    . ROBOT ASSISTED LAPAROSCOPIC RADICAL PROSTATECTOMY    . SHOULDER SURGERY Right   . SKIN GRAFT  Left   . 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 Head CT had no acute findings with atrophy and chronic small-vessel ischemic changes of the white matter. Prior to 10/13, pt had been seeing neurologists last several months for progressive neurological issues. Dr. Brett Fairy documented increased confusion, visiospatial problems, decreased recall, and memory deficits. Patient's wife states he has had several episodes of transient confusional episodes. Although no etiology has been confirmed, genotyping suggests high risk for Alzheimer's. Diet currently NPO.    Assessment / Plan / Recommendation Clinical Impression  Pt was encountered alert and sitting in chair with wife present, who reported baseline deficits in memory and visiospatial ability, difficulty navigating while driving and forgetting how to get to the kitchen in their house. She emphasized he had no deficits in receptive or expressive language prior to current hosiptal admission. SLP administered Western Aphasia Battery bedside screen where the pt achieved a score of 23/60, in the very severe range. He presents as global aphasia given pts receptive and expressive language deficits, although expressive more impaired than receptive. 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 Pts speech significant for dysfluencies, monotone quality, repetitions, perseveration, and phonemic paraphasias for target  words (still, mill, kill for "pillow"). Intelligibility reduced during confrontational naming and spontaneous speech, will start sentence but after 3 words look away, effortfully repeating "still" until it trails off and is  completely unintelligible. Pt intermittently comprehended social/basic biographical informaiton however exhibited difficulty comprehending most instructions and commands, Required written cues to initiate yes/no question subtest. Concern for safety judgement as pt attempted to get out of chair at end of session without assist. He is aware of expressive deficits (not receptive) which results in frustration. Wife expressed concern over test administration, pt's hearing, presence of masks and its influence on pts performance. SLP provided verbal and written education re: aphasia and discussed treatment plan to pt and wife. Pt requires continued SLP intervention for global aphasia, dysarthria, and cognitive impairments.    SLP Assessment  SLP Recommendation/Assessment: Patient needs continued Speech Lanaguage Pathology Services SLP Visit Diagnosis: Aphasia (R47.01);Cognitive communication deficit (R41.841);Dysarthria and anarthria (R47.1)    Follow Up Recommendations  Inpatient Rehab    Frequency and Duration min 2x/week  2 weeks      SLP Evaluation Cognition  Overall Cognitive Status: Impaired/Different from baseline Arousal/Alertness: Awake/alert Orientation Level: Oriented to person;Disoriented to place;Disoriented to time;Disoriented to situation Attention: Selective Selective Attention: Impaired Selective Attention Impairment: Verbal basic;Functional basic Memory: (TBA- impaired at baseline) Awareness: Impaired Awareness Impairment: Anticipatory impairment Problem Solving: Impaired Problem Solving Impairment: Verbal basic;Functional basic Executive Function: Initiating;Self Monitoring;Self Correcting Initiating: Impaired Initiating Impairment: Verbal basic Behaviors: Other (comment)(Became frustrated with performance ) Safety/Judgment: Impaired(Tried to get out of chair without assist)       Comprehension  Auditory Comprehension Overall Auditory Comprehension: Impaired Yes/No  Questions: (not understand instructions-needed cue to initiate (visual)) Commands: Impaired One Step Basic Commands: 50-74% accurate Two Step Basic Commands: 0-24% accurate Multistep Basic Commands: 0-24% accurate Conversation: Simple Interfering Components: Attention;Hearing;Visual impairments;Motor planning;Working Curator: Not tested Reading Comprehension Reading Status: Not tested    Expression Expression Primary Mode of Expression: Verbal Verbal Expression Overall Verbal Expression: Impaired Initiation: Impaired Automatic Speech: Social Response Level of Generative/Spontaneous Verbalization: Phrase Repetition: Impaired Level of Impairment: Word level Naming: Impairment Confrontation: Impaired Verbal Errors: Aware of errors;Perseveration;Phonemic paraphasias(perseverates with "still") Pragmatics: Impairment Impairments: Eye contact;Monotone Interfering Components: Attention Non-Verbal Means of Communication: Not applicable Written Expression Dominant Hand: Right Written Expression: Not tested   Oral / Motor  Oral Motor/Sensory Function Overall Oral Motor/Sensory Function: Moderate impairment(Possible apraxia ) Facial ROM: Within Functional Limits Facial Symmetry: Within Functional Limits Facial Strength: Within Functional Limits Velum: Within Functional Limits Mandible: Within Functional Limits Motor Speech Overall Motor Speech: Impaired Respiration: Within functional limits Phonation: Normal Resonance: Within functional limits Articulation: Impaired Level of Impairment: Word Intelligibility: Intelligibility reduced Motor Planning: Impaired Level of Impairment: Word Motor Speech Errors: Groping for words("pillow")   GO                    [name] 06/16/2019, 2:01 PM  Orbie Pyo Catharina Pica M.Ed Risk analyst [phone] Office [phone]

## 2019-06-16 NOTE — Progress Notes (Signed)
STROKE TEAM PROGRESS NOTE   INTERVAL HISTORY Patient remains awake alert and globally aphasic with nonfluent speech with significant word hesitancy and follows only simple midline and one-step commands.  MRI scan of the brain is not yet completed.  Blood pressure is adequately controlled.  Overnight long-term EEG monitoring shows focal left frontal slowing but no definite epileptiform activity noted.  No family available at bedside  Vitals:   06/16/19 0500 06/16/19 0600 06/16/19 0700 06/16/19 0800  BP: 123/70 121/74 (!) 118/105 140/80  Pulse: 84 65 69 66  Resp: (!) 24 (!) 23 16 17   Temp:    99.6 F (37.6 C)  TempSrc:    Oral  SpO2: 97% 96% 97% 100%  Weight:      Height:        CBC:  Recent Labs  Lab 06/15/19 1646 06/15/19 1659  WBC 5.1  --   NEUTROABS 3.2  --   HGB 13.6 13.9  HCT 40.3 41.0  MCV 90.8  --   PLT 254  --     Basic Metabolic Panel:  Recent Labs  Lab 06/15/19 1646 06/15/19 1659  NA 130* 129*  K 4.3 4.3  CL 95* 94*  CO2 25  --   GLUCOSE 125* 120*  BUN 15 16  CREATININE 1.02 1.00  CALCIUM 9.1  --    Lipid Panel:     Component Value Date/Time   CHOL 172 06/16/2019 0505   TRIG 56 06/16/2019 0505   HDL 58 06/16/2019 0505   CHOLHDL 3.0 06/16/2019 0505   VLDL 11 06/16/2019 0505   LDLCALC 103 (H) 06/16/2019 0505   HgbA1c:  Lab Results  Component Value Date   HGBA1C 5.8 (H) 06/16/2019   Urine Drug Screen: No results found for: LABOPIA, COCAINSCRNUR, LABBENZ, AMPHETMU, THCU, LABBARB  Alcohol Level     Component Value Date/Time   ETH <10 06/16/2019 0505    IMAGING Ct Head Code Stroke Wo Contrast 06/15/2019 1658 No acute finding by CT. Atrophy and chronic small-vessel ischemic changes of the white matter. *ASPECTS is 10. *   Ct Angio Head W Or Wo Contrast Ct Angio Neck W Or Wo Contrast 06/15/2019 1727 No large or medium vessel occlusion. No atherosclerotic disease at the carotid bifurcations. Ordinary nonstenotic calcification in the carotid  siphon regions.   MRI  pending   2D Echocardiogram pending   EEG  This study is suggestive of cortical dysfunction in the left frontotemporal region, which could be secondary to underlying structural abnormality like an acute infarct.  Additionally, there is evidence of moderate diffuse encephalopathy. No seizures or epileptiform discharges were seen throughout the recording.  LT EEG This study issuggestive of cortical dysfunction in the left frontotemporal region, which could be secondary to underlying structural abnormality like an acute infarct.Additionally, there is evidence of moderate diffuse encephalopathy. No seizures or epileptiform discharges were seen throughout the recording.  PHYSICAL EXAM Pleasant elderly Caucasian male not in distress.  Appears restless. . Afebrile. Head is nontraumatic. Neck is supple without bruit.    Cardiac exam no murmur or gallop. Lungs are clear to auscultation. Distal pulses are well felt. Neurological Exam :  He is awake alert globally aphasic.  Speaks occasional words and short sentences with a lot of perseveration verbal repetition.  Nonfluent speech.  Follows only few simple midline and one-step commands.  Is unable to name or repeat.  Extraocular movements are full range without nystagmus.  He blinks to threat on the left but not on  the right side consistently.  There appears to be some right sided neglect.  Face is symmetric without weakness tongue is midline.  Motor system exam reveals no upper or lower extremity drift and symmetric and equal strength in all 4 extremities.  Sensation and coordination difficult to test.  Gait not tested.  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  Received tPA 06/15/2019 at 1700.    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  Treated with IV TPA  Code Stroke CT head No acute abnormality. Small vessel disease. Atrophy. ASPECTS 10.     CTA head & neck calcification at ICA siphons only  MRI pending  2D Echo pending   EEG no sz. LT EEG no sz, L frontotemporal slowing  LDL 103  HgbA1c 5.8  SCDs for VTE prophylaxis  aspirin 81 mg daily prior to admission, now on No antithrombotic as within 24h of tPA administration. Plan resume aspirin 81 if 24h imaging neg for hemorrhage.   Therapy recommendations:  pending. Ok to be OOB  Disposition:  pending   Blood Pressure  Home meds:  noone BP goal per post tPA protocol x 24h following tPA administration  Hyperlipidemia  Home meds:  zetia 10  Developed confusion on atorvastatin, now a statin candidate  LDL 103, goal < 70  Resume zetia once able to swallow  Dysphagia . Secondary to stroke . NPO . Speech on board   Other Stroke Risk Factors  Advanced age  ETOH use, alcohol level <10, wife advised for him to drink no more than 2 drink(s) a day  Family hx stroke (father)  Coronary artery disease   Other Active Problems  Prostate cancer s/p prostatectomy ->radiation->Lupron  CKD  Hyponatremia 129  Progressive neuro issues over last several months. Followed by Dr. Brett Fairy. No definite etiology found.   several episodes of transient confusional episodes.    last Mini-Mental status exam score was 25/30 on 06/08/2019 at last visit with Dr. Brett Fairy.  neuropsychological test battery which suggested posterior cortical atrophy variant.    metabolic PET scan [id number] no diminished bilateral cortical metabolism to raise concern about Alzheimer's.    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    CSF cell count was normal at 4 and protein also normal at 51 mg percent.    Hospital day # 1  I have personally obtained history,examined this patient, reviewed notes, independently viewed imaging studies, participated in medical decision making and plan of care.ROS completed by me personally and pertinent positives fully documented  I have made any additions or clarifications directly to the above note.  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  Long-term overnight EEG monitoring is negative for  seizures.  Continue strict blood pressure control and close neurological monitoring as per post TPA protocol.  Mobilize out of bed.  Physical occupational and speech therapy consults.  Check MRI scan of the brain later today.  Discontinue long-term EEG monitoring.  Check echocardiogram.  No family available at the bedside for discussion during a.m. rounds This patient is critically ill and at significant risk of neurological worsening, death and care requires constant monitoring of vital signs, hemodynamics,respiratory and cardiac monitoring, extensive review of multiple databases, frequent neurological assessment, discussion with family, other specialists and medical decision making of high complexity.I have made any additions or clarifications directly to the above note.This critical care time does not reflect procedure time, or teaching time or supervisory time of PA/NP/Med Resident etc but could involve care discussion time.  I spent  30 minutes of neurocritical care time  in the care of  this patient.      Antony Contras, MD Medical Director [facility] Pager: [phone] 06/16/2019 1:46 PM   To contact Stroke Continuity provider, please refer to [email or web address] After hours, contact General Neurology

## 2019-06-16 NOTE — Telephone Encounter (Signed)
Noted I will forward to Dr Brett Fairy to make her aware when she is back in the office.

## 2019-06-16 NOTE — Telephone Encounter (Signed)
Dear Mrs. Terance Hart,  I would like an ophthalmologist to obtain field of vision, and retinal evaluation Dr. Junius Finner PA was able to see [name] earlier- I haven't been able to hear what the results were.  PCA may be an Alzheimer's variant.  I like Dr Maryruth Hancock  team input, please also ask if they have a working relationship with research centers. [facility] is top notch and I am sure Dr. Jenetta Downer '[facility] will refer if he has a relation ship to the memory clinic there, and other wise I will make these arrangements for you.  Stay Strong.   Jeb Levering, MD  ===View-only below this line===   ----- Message -----      From:Justin Marylou Mccoy, MD      Sent:06/15/2019 10:58 AM EDT        ID:[id number] Ripley Lovecchio, MD   Subject:Visit Follow-Up Question  Orion Crook  DOB: 1941-08-16 Dr. 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 Sincer has [facility] appt Fri 06/18/19 with O'Brien's PA. Would we be better served just going to [facility]? [facility] website says they see 1000 PCA patients/year. Do you think Tood has probable PCA, Alzheimer's with PCA, or something else? He is much worse. Almost no short term memory. Totally confused around house although when he gets to place he is looking for he knows exactly what he wanted to do & how to do it. He takes care of all his needs (cleaning his bipap-st machine, taking meds, getting dressed, fixing his breakfast, etc) and helps (washes dishes, cooks fish, waters plants, takes out trash).  Past month very fatigued. Stressed & frightened ( handles better some days than others). We still have good conversations. Visual symptoms strictly directional & double vision corrected vs other visual symptoms mentioned for PCA. Appt Fri 10/16 eye C.McCuen. Ask for tests? Field of vision? Opal Sidles

## 2019-06-16 NOTE — Procedures (Addendum)
Patient Name: Spencer Canterberry, MD  MRN: [id number]  Epilepsy Attending: Lora Havens  Referring Physician/Provider: Dr Antony Contras Date: 06/16/2019 Duration: 35.54 mins  Patient history: 78 year old male presented with sudden onset confusion and speech difficulty concerning for stroke.  EEG to evaluate for seizures  Level of alertness: Awake  AEDs during EEG study: None  Technical aspects: This EEG study was done with scalp electrodes positioned according to the 10-20 International system of electrode placement. Electrical activity was acquired at a sampling rate of 500Hz  and reviewed with a high frequency filter of 70Hz  and a low frequency filter of 1Hz . EEG data were recorded continuously and digitally stored.   Description: EEG showed continuous generalized 2 to 5 Hz theta-delta slowing, maximal left frontotemporal region. No clear posterior dominant rhythm was seen.  Hyperventilation and photic stimulation were not performed.  Abnormality -Continuous slow, generalized, maximum left frontotemporal  IMPRESSION: This study is suggestive of cortical dysfunction in the left frontotemporal region, which could be secondary to underlying structural abnormality like an acute infarct.  Additionally, there is evidence of moderate diffuse encephalopathy. No seizures or epileptiform discharges were seen throughout the recording.

## 2019-06-16 NOTE — Procedures (Signed)
Patient Name: Spencer Huettner, MD  MRN: [id number]  Epilepsy Attending: Lora Havens  Referring Physician/Provider: Dr Antony Contras Duration: 06/15/2019 2033 to 06/16/2019 1024  Patient history: 78 year old male presented with sudden onset confusion and speech difficulty concerning for stroke.  EEG to evaluate for seizures  Level of alertness: Awake, asleep  AEDs during EEG study: None  Technical aspects: This EEG study was done with scalp electrodes positioned according to the 10-20 International system of electrode placement. Electrical activity was acquired at a sampling rate of 500Hz  and reviewed with a high frequency filter of 70Hz  and a low frequency filter of 1Hz . EEG data were recorded continuously and digitally stored.   Description: EEG showed continuous generalized 2 to 5 Hz theta-delta slowing, maximal left frontotemporal region. No clear posterior dominant rhythm was seen.  Hyperventilation and photic stimulation were not performed.  Abnormality -Continuous slow, generalized, maximum left frontotemporal  IMPRESSION: This study is suggestive of cortical dysfunction in the left frontotemporal region, which could be secondary to underlying structural abnormality like an acute infarct.  Additionally, there is evidence of moderate diffuse encephalopathy. No seizures or epileptiform discharges were seen throughout the recording.  Darinda [place] Barbra Sarks

## 2019-06-16 NOTE — Evaluation (Signed)
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 TPA given. CT showing no acute changes. Pending MRI.   Clinical Impression   Pt admitted with above diagnosis. Unclear historian,  But noted in chart review pt was on a walk with his wife when he had onset of stroke symptoms; Presents to therapies with decr cognition, motor apraxia, difficulty organizing to solve problems related to mobility and ADLs; Recommend CIR; Pt currently with functional limitations due to the deficits listed below (see PT Problem List). Pt will benefit from skilled PT to increase their independence and safety with mobility to allow discharge to the venue listed below.         Follow Up Recommendations CIR    Equipment Recommendations  Other (comment)(to be determined)    Recommendations for Other Services       Precautions / Restrictions Precautions Precautions: Fall      Mobility  Bed Mobility Overal bed mobility: Needs Assistance Bed Mobility: Supine to Sit     Supine to sit: Min guard;+2 for safety/equipment     General bed mobility comments: Close guard for lines/leads; Cues to initiate  Transfers Overall transfer level: Needs assistance Equipment used: None Transfers: Sit to/from Stand Sit to Stand: Min assist;Mod assist         General transfer comment: Very close guard for safety, lines/leads; Stood impulsively; No physical assist to power up; noted braced LEs against bed, indicative of incr fall risk; Mod assist to stabilize at initial  stand  Ambulation/Gait Ambulation/Gait assistance: Min assist;+2 safety/equipment Gait Distance (Feet): 4 Feet(Distnce limited by EEG hookup ) Assistive device: 1 person hand held assist(second person for management of lines/leads) Gait Pattern/deviations: Decreased step length - right;Decreased step length - left     General Gait Details: Hand held assist for balance  Stairs            Wheelchair Mobility    Modified Rankin (Stroke Patients Only) Modified Rankin (Stroke Patients Only) Pre-Morbid Rankin Score: No symptoms Modified Rankin: Moderately severe disability     Balance Overall balance assessment: Needs assistance Sitting-balance support: No upper extremity supported;Feet supported Sitting balance-Leahy Scale: Fair(approaching Good)     Standing balance support: Bilateral upper extremity supported;During functional activity Standing balance-Leahy Scale: Poor Standing balance comment: Reliant on physical A                             Pertinent Vitals/Pain Pain Assessment: Faces Faces Pain Scale: No hurt Pain Intervention(s): Monitored during session;Limited activity within patient's tolerance;Repositioned    Home Living Family/patient expects to be discharged to:: Private residence Living Arrangements: Spouse/significant other Available Help at Discharge: Family Type of Home: House           Additional Comments: Pt reports he lives with his wife    Prior Function Level of Independence: Independent               Hand Dominance   Dominant Hand: Right    Extremity/Trunk Assessment   Upper Extremity Assessment Upper Extremity Assessment: RUE deficits/detail  RUE Deficits / Details: Noting decreased grasp strength adn gross motor strength compared to LUE. Pt right handed. Noting decreased fine motor coorindation. Unable to perform finger opposition due to cognitive and coordination deficits RUE Coordination: decreased fine  motor;decreased gross motor    Lower Extremity Assessment Lower Extremity Assessment: Defer to PT evaluation    Cervical / Trunk Assessment Cervical / Trunk Assessment: Normal  Communication   Communication: Expressive difficulties  Cognition Arousal/Alertness: Awake/alert Behavior During Therapy: WFL for tasks assessed/performed;Impulsive Overall Cognitive Status: Impaired/Different from baseline Area of Impairment: Orientation;Attention;Following commands;Safety/judgement;Awareness;Problem solving                 Orientation Level: Disoriented to;Place;Time;Situation Current Attention Level: Sustained   Following Commands: Follows one step commands with increased time(and occasional reminders) Safety/Judgement: Decreased awareness of safety;Decreased awareness of deficits Awareness: Intellectual Problem Solving: Difficulty sequencing;Requires verbal cues;Requires tactile cues;Slow processing General Comments: Pt with decreased sequencing and requiring significant time to perform simple ADLs. While donning socks, pt with decreased sequencing and poor coorindation; required Mod cues for problem solving and sequencing.  When donning his glasses, pt with poor awareness to recognize errors, requiring Max cues.       General Comments General comments (skin integrity, edema, etc.): VSS throughout    Exercises     Assessment/Plan    PT Assessment Patient needs continued PT services  PT Problem List Decreased strength;Decreased activity tolerance;Decreased balance;Decreased mobility;Decreased coordination;Decreased cognition;Decreased knowledge of use of DME;Decreased safety awareness;Decreased knowledge of precautions       PT Treatment Interventions DME instruction;Gait training;Stair training;Functional mobility training;Therapeutic activities;Therapeutic exercise;Balance training;Neuromuscular re-education;Cognitive remediation;Patient/family education    PT Goals  (Current goals can be found in the Care Plan section)  Acute Rehab PT Goals Patient Stated Goal: Unstated PT Goal Formulation: Patient unable to participate in goal setting Time For Goal Achievement: 06/30/19 Potential to Achieve Goals: Good    Frequency Min 4X/week   Barriers to discharge        Co-evaluation   Reason for Co-Treatment: For patient/therapist safety;To address functional/ADL transfers;Complexity of the patient's impairments (multi-system involvement)   OT goals addressed during session: ADL's and self-care       AM-PAC PT "6 Clicks" Mobility  Outcome Measure Help needed turning from your back to your side while in a flat bed without using bedrails?: A Little Help needed moving from lying on your back to sitting on the side of a flat bed without using bedrails?: A Little Help needed moving to and from a bed to a chair (including a wheelchair)?: A Little Help needed standing up from a chair using your arms (e.g., wheelchair or bedside chair)?: A Lot Help needed to walk in hospital room?: A Lot Help needed climbing 3-5 steps with a railing? : A Lot 6 Click Score: 15    End of Session Equipment Utilized During Treatment: Gait belt Activity Tolerance: Patient tolerated treatment well Patient left: in chair;with call bell/phone within reach;with chair alarm set Nurse Communication: Mobility status PT Visit Diagnosis: Unsteadiness on feet (R26.81);Other abnormalities of gait and mobility (R26.89);Other symptoms and signs involving the nervous system (R29.898)    Time: [id number] PT Time Calculation (min) (ACUTE ONLY): 28 min   Charges:   PT Evaluation $PT Eval Moderate Complexity: [place], [place] Pager [phone] Office [phone]   Colletta [place] 06/16/2019, 1:19 PM

## 2019-06-16 NOTE — Progress Notes (Signed)
  Echocardiogram 2D Echocardiogram has been performed.  Matilde Bash 06/16/2019, 10:34 AM

## 2019-06-16 NOTE — Telephone Encounter (Signed)
Pt wife has called to report pt is in hospital ICU for a possible stroke.  This is FYI, she has not requested a call back

## 2019-06-16 NOTE — Evaluation (Signed)
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 TPA given. CT showing no acute changes. Pending MRI.    Clinical Impression   Per chart review and pt report, he lives with his wife and performed BADLs. Pt currently requiring Min A for UB ADLs, Mod A for LB ADLs, and Min-Mod A for functional transfers. Pt presenting with decreased cognition, balance, strength, coordination, and activity tolerance. Pt very motivated and agreeable to participate in therapy. Pt would benefit from further acute OT to facilitate safe dc. Recommend dc to CIR for intensive OT to optimize safety, independence with ADLs, and return to PLOF.      Follow Up Recommendations  CIR;Supervision/Assistance - 24 hour    Equipment Recommendations  Other (comment)(Defer to next venue)    Recommendations for Other Services Rehab consult;PT consult;Speech consult     Precautions / Restrictions Precautions Precautions: Fall      Mobility Bed Mobility Overal bed mobility: Needs Assistance Bed Mobility: Supine to Sit     Supine to sit: Min guard;+2 for safety/equipment     General bed mobility comments: Close guard for lines/leads; Cues to initiate  Transfers Overall transfer level: Needs assistance Equipment used: None Transfers: Sit to/from Stand Sit to Stand: Min assist;Mod assist         General transfer comment: Very close guard for safety, lines/leads; Stood impulsively; No physical assist to power up; noted braced LEs against bed, indicative of incr  fall risk    Balance Overall balance assessment: Needs assistance Sitting-balance support: No upper extremity supported;Feet supported Sitting balance-Leahy Scale: Fair(approaching Good)     Standing balance support: Bilateral upper extremity supported;During functional activity Standing balance-Leahy Scale: Poor Standing balance comment: Reliant on physical A                           ADL either performed or assessed with clinical judgement   ADL Overall ADL's : Needs assistance/impaired Eating/Feeding: Set up;Supervision/ safety;Sitting   Grooming: Set up;Supervision/safety;Sitting Grooming Details (indicate cue type and reason): Cues for sequencing Upper Body Bathing: Minimal assistance;Sitting   Lower Body Bathing: Sit to/from stand;Moderate assistance Lower Body Bathing Details (indicate cue type and reason): Mod A for standing balance Upper Body Dressing : Minimal assistance;Sitting   Lower Body Dressing: Sit to/from stand;Moderate assistance Lower Body Dressing Details (indicate cue type and reason): Pt donning socks at EOB with Min A for sitting balance and Mod cues for sequencing. Mod A for standing balance Toilet Transfer: Moderate assistance;+2 for physical assistance;Stand-pivot(simulated to recliner) Toilet Transfer Details (indicate cue type and reason): Min A to power up and then Mod A for gaining balance in standing. Min Guard A for safety in pivot to recliner. +2 throughout         Functional mobility during ADLs: Moderate assistance(stand pivot) General ADL Comments: Pt presenting with decreased balance, cognition, strength, and activity tolerance.      Vision Baseline Vision/History: Wears glasses Wears Glasses: At all times Patient Visual Report: No change from baseline Additional Comments: Will continue to follow acutely assess  Perception     Praxis      Pertinent Vitals/Pain Pain Assessment: Faces Faces Pain Scale: No hurt Pain  Intervention(s): Monitored during session;Limited activity within patient's tolerance;Repositioned     Hand Dominance Right   Extremity/Trunk Assessment Upper Extremity Assessment Upper Extremity Assessment: RUE deficits/detail RUE Deficits / Details: Noting decreased grasp strength adn gross motor strength compared to LUE. Pt right handed. Noting decreased fine motor coorindation. Unable to perform finger opposition due to cognitive and coordination deficits RUE Coordination: decreased fine motor;decreased gross motor   Lower Extremity Assessment Lower Extremity Assessment: Defer to PT evaluation   Cervical / Trunk Assessment Cervical / Trunk Assessment: Normal   Communication Communication Communication: Expressive difficulties   Cognition Arousal/Alertness: Awake/alert Behavior During Therapy: WFL for tasks assessed/performed;Impulsive Overall Cognitive Status: Impaired/Different from baseline Area of Impairment: Orientation;Attention;Following commands;Safety/judgement;Awareness;Problem solving                 Orientation Level: Disoriented to;Place;Time;Situation Current Attention Level: Sustained   Following Commands: Follows one step commands with increased time(and occasional reminders) Safety/Judgement: Decreased awareness of safety;Decreased awareness of deficits Awareness: Intellectual Problem Solving: Difficulty sequencing;Requires verbal cues;Requires tactile cues;Slow processing General Comments: Pt with decreased sequencing and requiring significant time to perform simple ADLs. While donning socks, pt with decreased sequencing and poor coorindation; required Mod cues for problem solving and sequencing.  When donning his glasses, pt with poor awareness to recognize errors, requiring Max cues.    General Comments  VSS throughout    Exercises     Shoulder Instructions      Home Living Family/patient expects to be discharged to:: Private residence Living  Arrangements: Spouse/significant other Available Help at Discharge: Family Type of Home: House                           Additional Comments: Pt reports he lives with his wife      Prior Functioning/Environment Level of Independence: Independent                 OT Problem List: Decreased strength;Decreased range of motion;Decreased activity tolerance;Impaired balance (sitting and/or standing);Decreased knowledge of use of DME or AE;Decreased knowledge of precautions;Decreased safety awareness;Decreased cognition      OT Treatment/Interventions: Self-care/ADL training;Therapeutic exercise;Energy conservation;DME and/or AE instruction;Therapeutic activities;Patient/family education    OT Goals(Current goals can be found in the care plan section) Acute Rehab OT Goals Patient Stated Goal: Unstated OT Goal Formulation: With patient Time For Goal Achievement: 06/30/19 Potential to Achieve Goals: Good  OT Frequency: Min 3X/week   Barriers to D/C:            Co-evaluation PT/OT/SLP Co-Evaluation/Treatment: Yes Reason for Co-Treatment: For patient/therapist safety;To address functional/ADL transfers;Complexity of the patient's impairments (multi-system involvement)   OT goals addressed during session: ADL's and self-care      AM-PAC OT "6 Clicks" Daily Activity     Outcome Measure Help from another person eating meals?: None Help from another person taking care of personal grooming?: A Little Help from another person toileting, which includes using toliet, bedpan, or urinal?: A Lot Help from another person bathing (including washing, rinsing, drying)?: A Lot Help from another person to put on and taking off regular upper body clothing?: A Little Help from another person to put on and taking off regular lower body clothing?: A Lot 6 Click Score: 16   End of Session Equipment Utilized During Treatment: Gait belt Nurse Communication: Mobility status  Activity  Tolerance: Patient tolerated treatment well Patient left: in chair;with call bell/phone within reach;with chair alarm set  OT Visit Diagnosis: Unsteadiness on feet (R26.81);Other abnormalities of gait and mobility (R26.89);Muscle weakness (generalized) (M62.81);Other symptoms and signs involving cognitive function                Time: 1012-1029 OT Time Calculation (min): 17 min Charges:  OT General Charges $OT Visit: 1 Visit OT Evaluation $OT Eval Moderate Complexity: [place], OTR/L Acute Rehab Pager: [phone] Office: [place] 06/16/2019, 12:54 PM

## 2019-06-17 ENCOUNTER — Inpatient Hospital Stay (HOSPITAL_COMMUNITY): Payer: Medicare Other

## 2019-06-17 LAB — CBC
HCT: 39.7 % (ref 39.0–52.0)
Hemoglobin: 14.1 g/dL (ref 13.0–17.0)
MCH: 31.3 pg (ref 26.0–34.0)
MCHC: 35.5 g/dL (ref 30.0–36.0)
MCV: 88 fL (ref 80.0–100.0)
Platelets: 215 10*3/uL (ref 150–400)
RBC: 4.51 MIL/uL (ref 4.22–5.81)
RDW: 12.8 % (ref 11.5–15.5)
WBC: 7.4 10*3/uL (ref 4.0–10.5)
nRBC: 0 % (ref 0.0–0.2)

## 2019-06-17 LAB — BASIC METABOLIC PANEL
Anion gap: 13 (ref 5–15)
BUN: 11 mg/dL (ref 8–23)
CO2: 21 mmol/L — ABNORMAL LOW (ref 22–32)
Calcium: 9.2 mg/dL (ref 8.9–10.3)
Chloride: 96 mmol/L — ABNORMAL LOW (ref 98–111)
Creatinine, Ser: 0.78 mg/dL (ref 0.61–1.24)
GFR calc Af Amer: >60 mL/min (ref >60)
GFR calc non Af Amer: >60 mL/min (ref >60)
Glucose, Bld: 104 mg/dL — ABNORMAL HIGH (ref 70–99)
Potassium: 3.8 mmol/L (ref 3.5–5.1)
Sodium: 130 mmol/L — ABNORMAL LOW (ref 135–145)

## 2019-06-17 LAB — ECHOCARDIOGRAM COMPLETE
Height: 69 in
Weight: 2451.52 oz

## 2019-06-17 MED ORDER — ASPIRIN EC 81 MG PO TBEC
81.0000 mg | DELAYED_RELEASE_TABLET | Freq: Every day | ORAL | Status: DC
  Administered 2019-06-17 – 2019-06-18 (×2): 81 mg via ORAL
  Filled 2019-06-17 (×2): qty 1

## 2019-06-17 MED ORDER — CLOPIDOGREL BISULFATE 75 MG PO TABS
75.0000 mg | ORAL_TABLET | Freq: Every day | ORAL | Status: DC
  Administered 2019-06-17 – 2019-06-18 (×2): 75 mg via ORAL
  Filled 2019-06-17 (×2): qty 1

## 2019-06-17 MED ORDER — PANTOPRAZOLE SODIUM 40 MG PO TBEC
40.0000 mg | DELAYED_RELEASE_TABLET | Freq: Every day | ORAL | Status: DC
  Administered 2019-06-17: 40 mg via ORAL
  Filled 2019-06-17: qty 1

## 2019-06-17 MED ORDER — CLOPIDOGREL BISULFATE 75 MG PO TABS: 75.0000 mg | ORAL_TABLET | Freq: Every day | ORAL | Status: DC

## 2019-06-17 NOTE — Progress Notes (Signed)
Routine EEG completed, results pending; LTM started. Nursing staff and wife educated regarding the patient event button.

## 2019-06-17 NOTE — Procedures (Signed)
Patient Name: Spencer Hupe, MD  MRN: [id number]  Epilepsy Attending: Lora Havens  Referring Physician/Provider: Ferne Coe, NP Date: 06/16/2019 Duration: 35.54 mins  Patient history: 78 year old male presented with sudden onset confusion and speech difficulty concerning for stroke.  EEG to evaluate for seizures  Level of alertness: Awake  AEDs during EEG study: None  Technical aspects: This EEG study was done with scalp electrodes positioned according to the 10-20 International system of electrode placement. Electrical activity was acquired at a sampling rate of 500Hz  and reviewed with a high frequency filter of 70Hz  and a low frequency filter of 1Hz . EEG data were recorded continuously and digitally stored.   Description: The posterior dominant rhythm consists of 8 Hz activity of moderate voltage (25-35 uV) seen predominantly in posterior head regions, symmetric and reactive to eye opening and eye closing.  EEG also showed intermittent 2-5hz  generalized theta-delta slowing, maximal left frontotemporal region region. Hyperventilation and photic stimulation were not performed.  Abnormality -Intermittent slow, generalized, maximum left frontotemporal  IMPRESSION: This study is is suggestive of cortical dysfunction in the left frontotemporal region, non specific etiology. Additionally, there is evidence of mild diffuse encephalopathy. No seizures or epileptiform discharges were seen throughout the recording.

## 2019-06-17 NOTE — Plan of Care (Signed)
Progressing towards goals

## 2019-06-17 NOTE — Progress Notes (Signed)
STROKE TEAM PROGRESS NOTE   INTERVAL HISTORY His wife is at bedside. Up walking with therapy in the hall. Mental status and speech much improved today compared with yesterday, leaning more toward seizure causing issues rather than stroke effects.  MRI scan of the brain shows a tiny punctate left insular cortex infarct with patient's clinical presentation and and deficits were way out of proportion to the size of their infarct.  Wonder if the MRI abnormality may be seizure effect rather than a stroke.  EEG performed 2 days ago had shown focal left hemispheric slowing. He had some agitation last night which improved after his bladder was emptied. Vitals:   06/16/19 1828 06/17/19 0024 06/17/19 0453 06/17/19 0850  BP: (!) 156/96 128/79 119/76 127/75  Pulse: 64 61 (!) 54 67  Resp: 20 16 15 20   Temp: 98.8 F (37.1 C) 98.4 F (36.9 C) (!) 97.4 F (36.3 C) 97.7 F (36.5 C)  TempSrc: Oral Oral Oral Oral  SpO2: 99% 95% 100% 100%  Weight:      Height:        CBC:  Recent Labs  Lab 06/15/19 1646 06/15/19 1659 06/17/19 0357  WBC 5.1  --  7.4  NEUTROABS 3.2  --   --   HGB 13.6 13.9 14.1  HCT 40.3 41.0 39.7  MCV 90.8  --  88.0  PLT 254  --  123456    Basic Metabolic Panel:  Recent Labs  Lab 06/15/19 1646 06/15/19 1659 06/17/19 0357  NA 130* 129* 130*  K 4.3 4.3 3.8  CL 95* 94* 96*  CO2 25  --  21*  GLUCOSE 125* 120* 104*  BUN 15 16 11   CREATININE 1.02 1.00 0.78  CALCIUM 9.1  --  9.2   Lipid Panel:     Component Value Date/Time   CHOL 172 06/16/2019 0505   TRIG 56 06/16/2019 0505   HDL 58 06/16/2019 0505   CHOLHDL 3.0 06/16/2019 0505   VLDL 11 06/16/2019 0505   LDLCALC 103 (H) 06/16/2019 0505   HgbA1c:  Lab Results  Component Value Date   HGBA1C 5.8 (H) 06/16/2019   Urine Drug Screen: No results found for: LABOPIA, COCAINSCRNUR, LABBENZ, AMPHETMU, THCU, LABBARB  Alcohol Level     Component Value Date/Time   ETH <10 06/16/2019 0505    IMAGING Ct Head Code Stroke  Wo Contrast 06/15/2019 1658 No acute finding by CT. Atrophy and chronic small-vessel ischemic changes of the white matter. *ASPECTS is 10. *   Ct Angio Head W Or Wo Contrast Ct Angio Neck W Or Wo Contrast 06/15/2019 1727 No large or medium vessel occlusion. No atherosclerotic disease at the carotid bifurcations. Ordinary nonstenotic calcification in the carotid siphon regions.   MRI  Mr Brain Wo Contrast  Addendum Date: 06/16/2019   ADDENDUM REPORT: 06/16/2019 21:02 ADDENDUM: A 4 mm subcortical infarct is present in the right left frontal operculum on image 36 of series 2. Findings were discussed with Dr. Leonie Man at 5:45 p.m. Electronically Signed   By: San Morelle M.D.   On: 06/16/2019 21:02   Result Date: 06/16/2019 CLINICAL DATA:  Stroke, follow-up. Status post tPA. Slurred speech. EXAM: MRI HEAD WITHOUT CONTRAST TECHNIQUE: Multiplanar, multiecho pulse sequences of the brain and surrounding structures were obtained without intravenous contrast. COMPARISON:  CT head and CTA head and neck head/13/20. MR head without and with contrast 01/31/2019. FINDINGS: Brain: Diffusion-weighted images demonstrate no acute or subacute infarction. Mild generalized atrophy and white matter disease is stable.  No acute hemorrhage or mass lesion is present. The ventricles are of normal size. No significant extraaxial fluid collection is present. Dilated perivascular spaces are present in the basal ganglia. The brainstem and cerebellum are within normal limits. Vascular: Flow is present in the major intracranial arteries. Skull and upper cervical spine: The craniocervical junction is normal. Upper cervical spine is within normal limits. Marrow signal is unremarkable. Sinuses/Orbits: The paranasal sinuses and mastoid air cells are clear. Left lens replacement is noted. Globes and orbits are otherwise within normal limits. IMPRESSION: 1. No acute intracranial abnormality or significant interval change. 2. Stable  atrophy and white matter disease. Electronically Signed: By: San Morelle M.D. On: 06/16/2019 17:18    2D Echocardiogram Ejection fraction 60-65%.  Impaired left ventricular diastolic filling.  EEG  This study is suggestive of cortical dysfunction in the left frontotemporal region, which could be secondary to underlying structural abnormality like an acute infarct.  Additionally, there is evidence of moderate diffuse encephalopathy. No seizures or epileptiform discharges were seen throughout the recording.  LT EEG This study issuggestive of cortical dysfunction in the left frontotemporal region, which could be secondary to underlying structural abnormality like an acute infarct.Additionally, there is evidence of moderate diffuse encephalopathy. No seizures or epileptiform discharges were seen throughout the recording.  PHYSICAL EXAM    Pleasant elderly Caucasian male not in distress.  Appears restless. . Afebrile. Head is nontraumatic. Neck is supple without bruit.    Cardiac exam no murmur or gallop. Lungs are clear to auscultation. Distal pulses are well felt. Neurological Exam :  He is awake alert with expressive greater than receptive aphasia with some word hesitancy and nonfluent speech and word finding difficulties.  His repetition is better preserved.  Speaks  short sentences with  .  Nonfluent speech.  Follows most commands well.. . On clock drawing he scored 3/4 he was able to copy intersecting pentagons with some difficulty on the second attempt.  He does not appear to have significant visual agnosia today.  Extraocular movements are full range without nystagmus.  He blinks to threat on  both sides today there appears to be some right sided neglect.  Face is symmetric without weakness tongue is midline.  Motor system exam reveals no upper or lower extremity drift and symmetric and equal strength in all 4 extremities.  Sensation and coordination difficult to test.  Gait not  tested.  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 Received tPA 06/15/2019 at 1700.    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 Small vessel disease. Atrophy. ASPECTS 10.     CTA head & neck calcification at ICA siphons only  MRI pending  2D Echo pending   EEG no sz. LT EEG no sz, L frontotemporal slowing  LDL 103  HgbA1c 5.8  SCDs for VTE prophylaxis  aspirin 81 mg daily prior to admission, now on No antithrombotic add  Aspirin 81 mg and plavix 75 mg daily x 3 weeks and then aspirin alone   Therapy recommendations:  pending. Ok to be  OOB  Disposition:  pending   Blood Pressure  Home meds:  noone BP goal per post tPA protocol x 24h following tPA administration  Hyperlipidemia  Home meds:  zetia 10  Developed confusion on atorvastatin, now a statin candidate  LDL 103, goal < 70  Resume zetia once able to swallow  Dysphagia . Secondary to stroke . NPO . Speech on board   Other Stroke Risk Factors  Advanced age  ETOH use, alcohol level <10, wife advised for him to drink no more than 2 drink(s) a day  Family hx stroke (father)  Coronary artery disease   Other Active Problems  Prostate cancer s/p prostatectomy ->radiation->Lupron  CKD  Hyponatremia 129  Agitation, improved after bladder emptied     Urinary retention, I&O cath during the night following some agitation, which resolved after the cath  Progressive neuro issues over last several months.  Followed by Dr. Brett Fairy. No definite etiology found.   several episodes of transient confusional episodes.    last Mini-Mental status exam score was 25/30 on 06/08/2019 at last visit with Dr. Brett Fairy.  neuropsychological test battery which suggested posterior cortical atrophy variant.    metabolic PET scan [id number] no diminished bilateral cortical metabolism to raise concern about Alzheimer's.    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    CSF cell count was normal at 4 and protein also normal at 51 mg percent.    Hospital day # 2  Plan repeat overnight long-term EEG monitoring to look for any epileptiform features.  Continue ongoing therapies.  Aspirin and Plavix for 3 weeks followed by aspirin alone.  May consider adding Depakote for agitation or seizure prophylaxis later.  Long discussion with wife at the bedside and answered questions.  Greater than 50% time during this 25-minute visit was spent on counseling and coordination of care about his possible embolic stroke versus seizure and postictal confusion discussion and answering questions.  Antony Contras, MD Medical Director [facility] Pager: [phone] 06/17/2019 10:45 AM   To contact Stroke Continuity provider, please refer to [email or web address] After hours, contact General Neurology

## 2019-06-17 NOTE — Progress Notes (Signed)
  Speech Language Pathology Treatment: Cognitive-Linquistic  Patient Details Name: Spencer Goedeke, MD MRN: [id number] DOB: May 19, 1941 Today's Date: 06/17/2019 Time: [id number] SLP Time Calculation (min) (ACUTE ONLY): 29 min  Assessment / Plan / Recommendation Clinical Impression  peech Language Pathology Treatment: Cognitive-Linquistic  Patient Details Name: Spencer Lanclos, MD MRN: [id number] DOB: [birth year] Today's Date: 06/17/2019 Time: [id number] SLP Time Calculation (min) (ACUTE ONLY): 29 min  Assessment / Plan / Recommendation Clinical Impression  SLP was greeted with a smile from pt sitting in bed and wife at bedside. Intervention of language included visualization of written text, reading comprehension, writing and expression of thoughts. Self-formulation given subject in short sentence accurate for name, wife's name with spelling error for granddaughter's name. Read 2 paragraphs in newspaper with 90% accuracy and needed assist to answer comprehension questions. Incorrect responses or pt looked at therapist and did not appear to know how to respond. Able to express himself in short to moderate length sentence level improved from yesterday's at 2-3 word fluency level. Educated pt and wife re: cueing for desired word or thought via visualization with description, synonyms and writing. Verbalizing and writing biographical information. Plans are for CIR soon.      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 Head CT had no acute findings with atrophy and chronic small-vessel ischemic changes of the white matter. Prior to 10/13, pt had been seeing neurologists last several months for progressive neurological  issues. Dr. Brett Fairy documented increased confusion, visiospatial problems, decreased recall, and memory deficits. Patient's wife states he has had several episodes of transient confusional episodes. Although no etiology has been confirmed, genotyping suggests high risk for Alzheimer's. Diet currently NPO.       SLP Plan  Continue with current plan of care       Recommendations                   General recommendations: Rehab consult Oral Care Recommendations: Oral care BID Follow up Recommendations: Inpatient Rehab SLP Visit Diagnosis: Aphasia (R47.01) Plan: Continue with current plan of care       GO                [name] 06/17/2019, 10:58 PM

## 2019-06-17 NOTE — Consult Note (Signed)
Inpatient Rehabilitation Admissions Coordinator  Inpatient rehab consult received. I met with patient and his wife at bedside for rehab assessment. LTM in progress. We discussed goals and expectations of an inpt rehab assessment and wife requests admission. I will follow up tomorrow to plan admit. Discussed with Dr. Leonie Man by phone.  Danne Baxter, RN, MSN Rehab Admissions Coordinator [phone] 06/17/2019 4:17 PM

## 2019-06-17 NOTE — Progress Notes (Addendum)
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 CT showing no acute changes. EEG suggestive of cortical dysfunction in left frontotemporal region. 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     PT Comments    Pt progressing towards physical therapy goals. Requiring moderate assist for ambulation with walker. Pt displaying significant scissoring, posterior lean, narrow BOS, with several episodes of gross loss of balance. Speech seems to be improving since yesterday, but pt continues with decreased cognition including decreased orientation, awareness, and problem solving skills. Presents as high fall risk based on balance deficits, decreased gait speed and safety awareness. Continue to recommend comprehensive inpatient rehab (CIR) for post-acute therapy needs. Suspect pt will progress well based on motivation, PLOF, and family support.  I have discussed the patient's current level of function related to stroke with the patient and patient wife.  They acknowledge understanding of this and do not feel the patient would be able to have their care needs met at home.  They are interested in post-acute rehab in an inpatient setting.        Follow Up Recommendations  CIR;Supervision/Assistance - 24 hour     Equipment Recommendations  Rolling walker with 5" wheels    Recommendations for Other Services       Precautions / Restrictions Precautions Precautions: Fall Restrictions Weight Bearing Restrictions: No    Mobility  Bed  Mobility Overal bed mobility: Needs Assistance Bed Mobility: Supine to Sit     Supine to sit: Min guard;+2 for safety/equipment     General bed mobility comments: Close guard for lines/leads; Cues to initiate  Transfers Overall transfer level: Needs assistance Equipment used: None Transfers: Sit to/from Stand Sit to Stand: Min guard         General transfer comment: Very close guard for safety, lines/leads; Stood impulsively; No physical assist to power up; noted braced LEs against bed, indicative of incr fall risk  Ambulation/Gait Ambulation/Gait assistance: Mod assist Gait Distance (Feet): 125 Feet Assistive device: 1 person hand held assist;Rolling walker (2 wheeled) Gait Pattern/deviations: Decreased stride length;Scissoring;Narrow base of support     General Gait Details: Initially ambulating with no assistive device, pt demonstrating heavy scissoring and posterior lean, with several lateral LOB requiring assist by PT to correct. Rest of ambulation completed with walker and moderate assist for stability. Pt with very narrow BOS, scissoring, decreased push off in terminal stance; does not correct significantly with cues. Requires cues for direction, sequencing   Stairs             Wheelchair Mobility    Modified Rankin (Stroke Patients Only) Modified Rankin (Stroke Patients Only) Pre-Morbid Rankin Score: No symptoms Modified Rankin: Moderately severe disability     Balance Overall balance assessment: Needs assistance   Sitting balance-Leahy Scale: Fair     Standing balance support: Bilateral upper extremity supported Standing balance-Leahy Scale: Poor Standing balance comment: Reliant on physical A                            Cognition Arousal/Alertness:  Awake/alert Behavior During Therapy: Impulsive Overall Cognitive Status: Impaired/Different from baseline Area of Impairment: Orientation;Attention;Following  commands;Safety/judgement;Awareness;Problem solving;Memory                 Orientation Level: Disoriented to;Place;Time;Situation Current Attention Level: Sustained Memory: Decreased short-term memory Following Commands: Follows one step commands with increased time(and occasional reminders) Safety/Judgement: Decreased awareness of safety;Decreased awareness of deficits Awareness: Intellectual Problem Solving: Difficulty sequencing;Requires verbal cues;Requires tactile cues;Slow processing General Comments: Pt A&Ox1, oriented to self, unable to correctly answer place or month when given options or context i.e. asked if month was January, March or October and pt stating March. Pt is impulsive and requires cues for safety      Exercises      General Comments        Pertinent Vitals/Pain Pain Assessment: Faces Faces Pain Scale: No hurt    Home Living                      Prior Function            PT Goals (current goals can now be found in the care plan section) Acute Rehab PT Goals Patient Stated Goal: pt wife would like his speech to improve PT Goal Formulation: With patient/family Time For Goal Achievement: 07/01/19 Potential to Achieve Goals: Good Progress towards PT goals: Progressing toward goals    Frequency    Min 4X/week      PT Plan Current plan remains appropriate    Co-evaluation              AM-PAC PT "6 Clicks" Mobility   Outcome Measure  Help needed turning from your back to your side while in a flat bed without using bedrails?: A Little Help needed moving from lying on your back to sitting on the side of a flat bed without using bedrails?: A Little Help needed moving to and from a bed to a chair (including a wheelchair)?: A Little Help needed standing up from a chair using your arms (e.g., wheelchair or bedside chair)?: A Little Help needed to walk in hospital room?: A Lot Help needed climbing 3-5 steps with a railing? :  Total 6 Click Score: 15    End of Session Equipment Utilized During Treatment: Gait belt Activity Tolerance: Patient tolerated treatment well Patient left: in chair;with call bell/phone within reach;with chair alarm set;with restraints reapplied Nurse Communication: Mobility status PT Visit Diagnosis: Unsteadiness on feet (R26.81);Other abnormalities of gait and mobility (R26.89);Other symptoms and signs involving the nervous system (R29.898)     Time: 1749-4496 PT Time Calculation (min) (ACUTE ONLY): 33 min  Charges:  $Gait Training: 8-22 mins $Therapeutic Activity: 8-22 mins                     Ellamae Sia, PT, DPT Acute Rehabilitation Services Pager [phone] Office [phone]    Willy Eddy 06/17/2019, 12:44 PM

## 2019-06-17 NOTE — Progress Notes (Deleted)
  Speech Language Pathology Treatment: Cognitive-Linquistic  Patient Details Name: Spencer Glew, MD MRN: [id number] DOB: 1941/03/24 Today's Date: 06/17/2019 Time: [id number] SLP Time Calculation (min) (ACUTE ONLY): 29 min  Assessment / Plan / Recommendation Clinical Impression  SLP was greeted with a smile from pt sitting in bed and wife at bedside. Intervention of language included visualization of written text, reading comprehension, writing and expression of thoughts. Self-formulation given subject in short sentence accurate for name, wife's name with spelling error for granddaughter's name. Read 2 paragraphs in newspaper with 90% accuracy and needed assist to answer comprehension questions. Incorrect responses or pt looked at therapist and did not appear to know how to respond. Able to express himself in short to moderate length sentence level improved from yesterday's at 2-3 word fluency level. Educated pt and wife re: cueing for desired word or thought via visualization with description, synonyms and writing. Verbalizing and writing biographical information. Plans are for CIR soon.    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 Head CT had no acute findings with atrophy and chronic small-vessel ischemic changes of the white matter. Prior to 10/13, pt had been seeing neurologists last several months for progressive neurological issues. Dr. Brett Fairy documented increased confusion, visiospatial problems, decreased recall, and memory deficits. Patient's wife states he has had several episodes of transient confusional episodes. Although no etiology has been confirmed, genotyping suggests high risk for Alzheimer's. Diet currently  NPO.       SLP Plan  Continue with current plan of care       Recommendations                   General recommendations: Rehab consult Oral Care Recommendations: Oral care BID Follow up Recommendations: Inpatient Rehab SLP Visit Diagnosis: Aphasia (R47.01) Plan: Continue with current plan of care       GO                [name] 06/17/2019, 10:16 PM

## 2019-06-18 ENCOUNTER — Inpatient Hospital Stay (HOSPITAL_COMMUNITY)
Admission: RE | Admit: 2019-06-18 | Discharge: 2019-06-24 | DRG: 945 | Disposition: A | Discharge status: Home / Self Care | Payer: Medicare Other | Source: Intra-hospital | Attending: Physical Medicine & Rehabilitation | Admitting: Physical Medicine & Rehabilitation

## 2019-06-18 ENCOUNTER — Encounter (HOSPITAL_COMMUNITY): Payer: Self-pay

## 2019-06-18 ENCOUNTER — Other Ambulatory Visit: Payer: Self-pay

## 2019-06-18 DIAGNOSIS — R3915 Urgency of urination: Secondary | ICD-10-CM | POA: Diagnosis present

## 2019-06-18 DIAGNOSIS — E871 Hypo-osmolality and hyponatremia: Secondary | ICD-10-CM | POA: Diagnosis not present

## 2019-06-18 DIAGNOSIS — Z923 Personal history of irradiation: Secondary | ICD-10-CM | POA: Diagnosis not present

## 2019-06-18 DIAGNOSIS — I2510 Atherosclerotic heart disease of native coronary artery without angina pectoris: Secondary | ICD-10-CM | POA: Diagnosis present

## 2019-06-18 DIAGNOSIS — Z823 Family history of stroke: Secondary | ICD-10-CM

## 2019-06-18 DIAGNOSIS — R5381 Other malaise: Principal | ICD-10-CM | POA: Diagnosis present

## 2019-06-18 DIAGNOSIS — Z7982 Long term (current) use of aspirin: Secondary | ICD-10-CM

## 2019-06-18 DIAGNOSIS — R0989 Other specified symptoms and signs involving the circulatory and respiratory systems: Secondary | ICD-10-CM

## 2019-06-18 DIAGNOSIS — R4587 Impulsiveness: Secondary | ICD-10-CM

## 2019-06-18 DIAGNOSIS — Z9079 Acquired absence of other genital organ(s): Secondary | ICD-10-CM

## 2019-06-18 DIAGNOSIS — G40909 Epilepsy, unspecified, not intractable, without status epilepticus: Secondary | ICD-10-CM

## 2019-06-18 DIAGNOSIS — R001 Bradycardia, unspecified: Secondary | ICD-10-CM

## 2019-06-18 DIAGNOSIS — F0789 Other personality and behavioral disorders due to known physiological condition: Secondary | ICD-10-CM

## 2019-06-18 DIAGNOSIS — Z20828 Contact with and (suspected) exposure to other viral communicable diseases: Secondary | ICD-10-CM | POA: Diagnosis present

## 2019-06-18 DIAGNOSIS — E785 Hyperlipidemia, unspecified: Secondary | ICD-10-CM | POA: Diagnosis present

## 2019-06-18 DIAGNOSIS — Z7902 Long term (current) use of antithrombotics/antiplatelets: Secondary | ICD-10-CM | POA: Diagnosis not present

## 2019-06-18 DIAGNOSIS — G479 Sleep disorder, unspecified: Secondary | ICD-10-CM

## 2019-06-18 DIAGNOSIS — G4700 Insomnia, unspecified: Secondary | ICD-10-CM | POA: Diagnosis present

## 2019-06-18 DIAGNOSIS — G4733 Obstructive sleep apnea (adult) (pediatric): Secondary | ICD-10-CM

## 2019-06-18 DIAGNOSIS — R569 Unspecified convulsions: Secondary | ICD-10-CM

## 2019-06-18 DIAGNOSIS — R7303 Prediabetes: Secondary | ICD-10-CM

## 2019-06-18 DIAGNOSIS — F09 Unspecified mental disorder due to known physiological condition: Secondary | ICD-10-CM

## 2019-06-18 DIAGNOSIS — G9340 Encephalopathy, unspecified: Secondary | ICD-10-CM | POA: Diagnosis not present

## 2019-06-18 DIAGNOSIS — K5901 Slow transit constipation: Secondary | ICD-10-CM | POA: Diagnosis not present

## 2019-06-18 DIAGNOSIS — R451 Restlessness and agitation: Secondary | ICD-10-CM

## 2019-06-18 DIAGNOSIS — Z8546 Personal history of malignant neoplasm of prostate: Secondary | ICD-10-CM

## 2019-06-18 MED ORDER — SENNOSIDES-DOCUSATE SODIUM 8.6-50 MG PO TABS: 1.0000 | ORAL_TABLET | Freq: Every evening | ORAL | Status: DC | PRN

## 2019-06-18 MED ORDER — SORBITOL 70 % SOLN
30.0000 mL | Freq: Every day | Status: DC | PRN
  Administered 2019-06-22: 30 mL via ORAL
  Filled 2019-06-18: qty 30

## 2019-06-18 MED ORDER — LEVETIRACETAM ER 500 MG PO TB24
500.0000 mg | ORAL_TABLET | Freq: Every day | ORAL | Status: DC
  Administered 2019-06-18: 500 mg via ORAL
  Filled 2019-06-18: qty 1

## 2019-06-18 MED ORDER — CLOPIDOGREL BISULFATE 75 MG PO TABS
75.0000 mg | ORAL_TABLET | Freq: Every day | ORAL | Status: DC
  Administered 2019-06-19 – 2019-06-24 (×6): 75 mg via ORAL
  Filled 2019-06-18 (×6): qty 1

## 2019-06-18 MED ORDER — ASPIRIN EC 81 MG PO TBEC
81.0000 mg | DELAYED_RELEASE_TABLET | Freq: Every day | ORAL | Status: DC
  Administered 2019-06-19 – 2019-06-24 (×6): 81 mg via ORAL
  Filled 2019-06-18 (×6): qty 1

## 2019-06-18 MED ORDER — ACETAMINOPHEN 160 MG/5ML PO SOLN: 650.0000 mg | ORAL | Status: DC | PRN

## 2019-06-18 MED ORDER — ACETAMINOPHEN 325 MG PO TABS: 650.0000 mg | ORAL_TABLET | ORAL | Status: DC | PRN

## 2019-06-18 MED ORDER — LEVETIRACETAM ER 500 MG PO TB24: 500.0000 mg | ORAL_TABLET | Freq: Every day | ORAL | Status: DC

## 2019-06-18 MED ORDER — EZETIMIBE 10 MG PO TABS
10.0000 mg | ORAL_TABLET | Freq: Every day | ORAL | Status: DC
  Administered 2019-06-18 – 2019-06-24 (×7): 10 mg via ORAL
  Filled 2019-06-18 (×7): qty 1

## 2019-06-18 MED ORDER — PANTOPRAZOLE SODIUM 40 MG PO TBEC
40.0000 mg | DELAYED_RELEASE_TABLET | Freq: Every day | ORAL | Status: DC
  Administered 2019-06-18: 40 mg via ORAL
  Filled 2019-06-18: qty 1

## 2019-06-18 MED ORDER — LEVETIRACETAM ER 500 MG PO TB24
500.0000 mg | ORAL_TABLET | Freq: Every day | ORAL | Status: DC
  Administered 2019-06-19 – 2019-06-24 (×6): 500 mg via ORAL
  Filled 2019-06-18 (×6): qty 1

## 2019-06-18 MED ORDER — CLOPIDOGREL BISULFATE 75 MG PO TABS: 75.0000 mg | ORAL_TABLET | Freq: Every day | ORAL | Status: DC

## 2019-06-18 MED ORDER — ACETAMINOPHEN 650 MG RE SUPP: 650.0000 mg | RECTAL | Status: DC | PRN

## 2019-06-18 NOTE — PMR Pre-admission (Signed)
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 Date: a 05/03/2010 b 03/03/2011     Deduct: $1408      Out of Pocket Max: none      Life Max: none CIR: 100%      SNF: 20 full days Outpatient: 80%     Co-Pay: 20% Home Health: 100%      Co-Pay: none DME: 805     Co-Pay: 20% Providers: pt choice  SECONDARY: BCBS supplement      Policy#: [id number]      Subscriber: pt  Medicaid Application Date:       Case Manager:  Disability Application Date:       Case Worker:   The "Data Collection Information Summary" for patients in Inpatient Rehabilitation Facilities with attached "Privacy Act [place] Records" was provided and verbally reviewed with: Patient and Family  Emergency Contact Information Contact Information    Name Relation Home Work [place] Spouse [phone]  [phone]   Werner Lean Daughter [phone]        Current Medical History  Patient Admitting Diagnosis: encephalopathy, seizure disorder  History of Present Illness:  78 year old right-handed male with history of hyperlipidemia, prostate cancer 2008 with prostatectomy as well as radiation therapy and Lupron injections every 6 months, CAD maintained on low-dose aspirin and syncope.  Presented 06/15/2019 with altered mental status and speech difficulty.  Per report and discussion with wife patient has had progressive neurological issues for the last several months and has been seen outpatient neurology by Dr. Brett Fairy with several CT scans of the head showing no definite etiology.  No history of documented seizure or seizure-like episodes.  He had a PET scan done  04/06/2019 which did not suggest any diminished bilateral cortical metabolism to raise concern about Alzheimer's.  Patient has had neuropsychological test battery recently which suggested posterior cortical atrophy variant.  His last Mini-Mental status exam was 25 out of 30 on 06/08/2019 with his visit to neurology services Dr. Brett Fairy.  Latest chemistries upon admission with sodium 130, COVID negative, hemoglobin 13.6, WBC 5.1.  Cranial CT scan 06/15/2019 showed no acute findings by CT.  Noted atrophy and chronic small vessel ischemic changes of the white matter.  CT angiogram of head and neck with no large or medium vessel occlusion.  MRI did not identify any acute intracranial abnormality or significant interval change.  Patient  receive TPA.  Echocardiogram with ejection fraction of 65% without emboli.  EEG 06/17/2019 completed suggestive of cortical dysfunction in the left frontotemporal region nonspecific etiology no seizure activity.  Placed on Keppra for seizure prophylaxis.  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  Currently maintained on aspirin 81 mg daily and Plavix 75 mg daily x3 weeks then aspirin alone.  Patient is tolerating a regular consistency diet.  There have been intermittent bouts of agitation and restlessness noted.    Complete NIHSS TOTAL:  3  Patient's medical record from [facility] has been reviewed by the rehabilitation admission coordinator and physician.  Past Medical History  Past Medical History:  Diagnosis Date  . Allergy   . Arthritis   . Cancer ([place])   . Cataract   . Chronic kidney disease    atrophic left kidney  . Coronary atherosclerosis    a. 2012 - incidentally noted on CT scan of chest;  b. 08/2011 Myoview: normal; c. 03/2015 CPX @ [facility]: normal w/ PAC's, PVC's, and a 3 beat run of NSVT in recovery.  Marland Kitchen Heart  murmur    asymptomatic function murmur.  Marland Kitchen History of echocardiogram    a. 12/2004 Echo: Nl EF, trace TR/MR.  Marland Kitchen Hx of radiation therapy   . Hyperlipidemia   . Personal history of malignant neoplasm of prostate    a. 2008 s/p prostatecomtomy-->radiation-->currently on Lupron injections.  . Sinus bradycardia   . Syncope    a. 12/2016    Family History   family history includes Coronary artery disease in his father; Hypertension in his mother; Stroke in his father; Thyroid disease in his mother.  Prior Rehab/Hospitalizations Has the patient had prior rehab or hospitalizations prior to admission? Yes  Has the patient had major surgery during 100 days prior to admission? No   Current Medications  Current Facility-Administered Medications:  .  0.9 %  sodium chloride infusion, , Intravenous, Continuous, Garvin Fila, MD, Last Rate: 75 mL/hr at 06/17/19 0400 .  acetaminophen (TYLENOL) tablet 650 mg, 650 mg, Oral, Q4H PRN **OR** acetaminophen (TYLENOL) solution 650 mg, 650 mg, Per Tube, Q4H PRN **OR** acetaminophen (TYLENOL) suppository 650 mg, 650 mg, Rectal, Q4H PRN, Leonie Man, Pramod S, MD .  aspirin EC tablet 81 mg, 81 mg, Oral, Daily, Biby, Sharon L, NP, 81 mg at 06/18/19 1009 .  Chlorhexidine Gluconate Cloth 2 % PADS 6 each, 6 each, Topical, Daily, Aroor, Lanice Schwab, MD, 6 each at 06/15/19 1842 .  clopidogrel (PLAVIX) tablet 75 mg, 75 mg, Oral, Daily, Biby, Sharon L, NP, 75 mg at 06/18/19 1009 .  levETIRAcetam (KEPPRA XR) 24 hr tablet 500 mg, 500 mg, Oral, Daily, Garvin Fila, MD, 500 mg at 06/18/19 1206 .  pantoprazole (PROTONIX) EC tablet 40 mg, 40 mg, Oral, QHS, Garvin Fila, MD, 40 mg at 06/17/19 2208 .  senna-docusate (Senokot-S) tablet 1 tablet, 1 tablet, Oral, QHS PRN, Garvin Fila, MD  Patients Current Diet:  Diet Order            Diet regular Room service appropriate? Yes; Fluid consistency: Thin  Diet effective now              Precautions /  Restrictions Precautions Precautions: Fall Restrictions Weight Bearing Restrictions: No   Has the patient had 2 or more falls or a fall with injury in the past year? No  Prior Activity Level Limited Community (1-2x/wk): (very active, independent; gradual decline since March)  Prior Functional Level Self Care: Did the patient need help bathing, dressing, using the toilet or eating? Independent  Indoor Mobility: Did the patient need assistance with walking from room to room (with or without device)? Independent  Stairs: Did the patient need assistance with internal or external stairs (with or without device)? Independent  Functional Cognition: Did the patient need help planning regular tasks such as shopping or remembering to take medications? Needed some help  Home Assistive Devices / Equipment    Prior Device Use: Indicate devices/aids used  by the patient prior to current illness, exacerbation or injury? None of the above  Current Functional Level Cognition  Arousal/Alertness: Awake/alert Overall Cognitive Status: Impaired/Different from baseline Current Attention Level: Sustained Orientation Level: Oriented X4 Following Commands: Follows one step commands with increased time(and occasional reminders) Safety/Judgement: Decreased awareness of safety, Decreased awareness of deficits General Comments: Pt A&Ox1, oriented to self, unable to correctly answer place or month when given options or context i.e. asked if month was January, March or October and pt stating March. Pt is impulsive and requires cues for safety Attention: Selective Selective Attention: Impaired Selective Attention Impairment: Verbal basic, Functional basic Memory: (TBA- impaired at baseline) Awareness: Impaired Awareness Impairment: Anticipatory impairment Problem Solving: Impaired Problem Solving Impairment: Verbal basic, Functional basic Executive Function: Initiating, Self Monitoring, Self  Correcting Initiating: Impaired Initiating Impairment: Verbal basic Behaviors: Other (comment)(Became frustrated with performance ) Safety/Judgment: Impaired(Tried to get out of chair without assist)    Extremity Assessment (includes Sensation/Coordination)  Upper Extremity Assessment: RUE deficits/detail RUE Deficits / Details: Noting decreased grasp strength adn gross motor strength compared to LUE. Pt right handed. Noting decreased fine motor coorindation. Unable to perform finger opposition due to cognitive and coordination deficits RUE Coordination: decreased fine motor, decreased gross motor  Lower Extremity Assessment: Defer to PT evaluation    ADLs  Overall ADL's : Needs assistance/impaired Eating/Feeding: Set up, Supervision/ safety, Sitting Grooming: Set up, Supervision/safety, Sitting Grooming Details (indicate cue type and reason): Cues for sequencing Upper Body Bathing: Minimal assistance, Sitting Lower Body Bathing: Sit to/from stand, Moderate assistance Lower Body Bathing Details (indicate cue type and reason): Mod A for standing balance Upper Body Dressing : Minimal assistance, Sitting Lower Body Dressing: Sit to/from stand, Moderate assistance Lower Body Dressing Details (indicate cue type and reason): Pt donning socks at EOB with Min A for sitting balance and Mod cues for sequencing. Mod A for standing balance Toilet Transfer: Moderate assistance, +2 for physical assistance, Stand-pivot(simulated to recliner) Toilet Transfer Details (indicate cue type and reason): Min A to power up and then Mod A for gaining balance in standing. Min Guard A for safety in pivot to recliner. +2 throughout Functional mobility during ADLs: Moderate assistance(stand pivot) General ADL Comments: Pt presenting with decreased balance, cognition, strength, and activity tolerance.     Mobility  Overal bed mobility: Needs Assistance Bed Mobility: Supine to Sit Supine to sit: Min guard, +2 for  safety/equipment General bed mobility comments: Close guard for lines/leads; Cues to initiate    Transfers  Overall transfer level: Needs assistance Equipment used: None Transfers: Sit to/from Stand Sit to Stand: Min guard General transfer comment: Very close guard for safety, lines/leads; Stood impulsively; No physical assist to power up; noted braced LEs against bed, indicative of incr fall risk    Ambulation / Gait / Stairs / Wheelchair Mobility  Ambulation/Gait Ambulation/Gait assistance: Mod assist Gait Distance (Feet): 125 Feet Assistive device: 1 person hand held assist, Rolling walker (2 wheeled) Gait Pattern/deviations: Decreased stride length, Scissoring, Narrow base of support General Gait Details: Initially ambulating with no assistive device, pt demonstrating heavy scissoring and posterior lean, with several lateral LOB requiring assist by PT to correct. Rest of ambulation completed with walker and moderate assist for stability. Pt with very narrow BOS, scissoring, decreased push off in terminal stance; does not correct significantly with cues. Requires cues for direction, sequencing    Posture / Balance Balance Overall balance assessment: Needs assistance Sitting-balance support: No upper extremity supported, Feet supported Sitting balance-Leahy  Scale: Fair Standing balance support: Bilateral upper extremity supported Standing balance-Leahy Scale: Poor Standing balance comment: Reliant on physical A    Special needs/care consideration BiPAP/CPAP yes, began 02/2019 CPM  N/a Continuous Drip IV  N/a Dialysis n/a Life Vest  N/a Oxygen  N/a Special Bed  N/a Trach Size  N/a Wound Vac n/a Skin  intact Bowel mgmt:  No LBM documented Bladder mgmt: external catheter Diabetic mgmt: n/a Behavioral consideration n/a Chemo/radiation history for prostate cancer Designated visitor is Opal Sidles, wife   Previous Home Environment  Living Arrangements: Spouse/significant other  Lives  With: Spouse Available Help at Discharge: Family, Available 24 hours/day Type of Home: House Home Layout: Two level, Bed/bath upstairs Alternate Level Stairs-Rails: Right, Left(rail on right going up an don left going down) Alternate Level Stairs-Number of Steps: flight Home Access: Stairs to enter Entrance Stairs-Rails: None Entrance Stairs-Number of Steps: 1 step from garage [employer] Shower/Tub: Multimedia programmer: Standard Bathroom Accessibility: Yes How Accessible: Accessible via walker Home Care Services: No Additional Comments: lives with his wife  Discharge Living Setting Plans for Discharge Living Setting: Patient's home, Lives with (comment)(spouse) Type of Home at Discharge: House Discharge Home Layout: Two level, Bed/bath upstairs Alternate Level Stairs-Rails: Right, Left Alternate Level Stairs-Number of Steps: flight Discharge Home Access: Stairs to enter Entrance Stairs-Rails: None Entrance Stairs-Number of Steps: 1 step through garage Discharge Bathroom Shower/Tub: Horticulturist, commercial: Standard Discharge Bathroom Accessibility: Yes How Accessible: Accessible via walker Does the patient have any problems obtaining your medications?: No  Social/Family/Support Systems Patient Roles: Spouse, Parent(retired Dealer) Contact Information: wife, Opal Sidles Anticipated Caregiver: wife Anticipated Ambulance person Information: see above Ability/Limitations of Caregiver: no limitations Caregiver Availability: 24/7 Discharge Plan Discussed with Primary Caregiver: Yes Is Caregiver In Agreement with Plan?: Yes Does Caregiver/Family have Issues with Lodging/Transportation while Pt is in Rehab?: No  Goals/Additional Needs Patient/Family Goal for Rehab: Mod I to supervision PT, suerpvison OT and SLP Expected length of stay: ELOS 7 to 10 days Pt/Family Agrees to Admission and willing to participate: Yes Program Orientation Provided & Reviewed  with Pt/Caregiver Including Roles  & Responsibilities: Yes  Decrease burden of Care through IP rehab admission: n/a  Possible need for SNF placement upon discharge: n/a  Patient Condition: I have reviewed medical records from [facility] , spoken with CSW, and patient and spouse. I met with patient at the bedside for inpatient rehabilitation assessment.  Patient will benefit from ongoing PT, OT and SLP, can actively participate in 3 hours of therapy a day 5 days of the week, and can make measurable gains during the admission.  Patient will also benefit from the coordinated team approach during an Inpatient Acute Rehabilitation admission.  The patient will receive intensive therapy as well as Rehabilitation physician, nursing, social worker, and care management interventions.  Due to bladder management, bowel management, safety, skin/wound care, disease management, medication administration, pain management and patient education the patient requires 24 hour a day rehabilitation nursing.  The patient is currently min to mod assist with mobility and basic ADLs.  Discharge setting and therapy post discharge at home with outpatient is anticipated.  Patient has agreed to participate in the Acute Inpatient Rehabilitation Program and will admit today.  Preadmission Screen Completed By:  Cleatrice Burke, 06/18/2019 1:21 PM ______________________________________________________________________   Discussed status with Dr. Naaman Plummer  on  06/18/2019 at 2:10 pm and received approval for admission today.  Admission Coordinator:  Cleatrice Burke, RN, time 2:10pm/Date 06/18/2019  Assessment/Plan: Diagnosis: complicated seizures with encephalopathy 1. Does the need for close, 24 hr/day Medical supervision in concert with the patient's rehab needs make it unreasonable for this patient to be served in a less intensive setting? Yes 2. Co-Morbidities requiring supervision/potential complications:  prostate cancer, ckd 3. Due to bladder management, bowel management, safety, skin/wound care, disease management, medication administration, pain management and patient education, does the patient require 24 hr/day rehab nursing? Yes 4. Does the patient require coordinated care of a physician, rehab nurse, PT, OT, and SLP to address physical and functional deficits in the context of the above medical diagnosis(es)? Yes Addressing deficits in the following areas: balance, endurance, locomotion, strength, transferring, bowel/bladder control, bathing, dressing, feeding, grooming, toileting, cognition, language and psychosocial support 5. Can the patient actively participate in an intensive therapy program of at least 3 hrs of therapy 5 days a week? Yes 6. The potential for patient to make measurable gains while on inpatient rehab is excellent 7. Anticipated functional outcomes upon discharge from inpatient rehab: modified independent and supervision PT, modified independent and supervision OT, modified independent and supervision SLP 8. Estimated rehab length of stay to reach the above functional goals is: 7-10 days 9. Anticipated discharge destination: Home 10. Overall Rehab/Functional Prognosis: excellent   MD Signature: Meredith Staggers, MD, [facility] 06/18/2019

## 2019-06-18 NOTE — Progress Notes (Signed)
STROKE TEAM PROGRESS NOTE   INTERVAL HISTORY His wife is at bedside.   Mental status and speech continues to improve compared with yesterday, leaning more toward seizure causing issues rather than stroke effects. LTEM shows improving in left sided slowing also favors seizures with post ictal confusion as his presentation MRI scan of the brain shows a tiny punctate left insular cortex infarct with patient's clinical presentation and and deficits were way out of proportion to the size of their infarct.  Wonder if the MRI abnormality may be seizure effect rather than a stroke.   Vitals:   06/17/19 2332 06/18/19 0342 06/18/19 0819 06/18/19 1209  BP: 130/78 122/75 (!) 147/92 127/71  Pulse: (!) 56 (!) 47 (!) 49 (!) 54  Resp: 16 16 16 20   Temp: 98.2 F (36.8 C) 98 F (36.7 C) (!) 96.7 F (35.9 C) 97.8 F (36.6 C)  TempSrc: Oral Oral Axillary Oral  SpO2: 98% 100% 100% 99%  Weight:      Height:        CBC:  Recent Labs  Lab 06/15/19 1646 06/15/19 1659 06/17/19 0357  WBC 5.1  --  7.4  NEUTROABS 3.2  --   --   HGB 13.6 13.9 14.1  HCT 40.3 41.0 39.7  MCV 90.8  --  88.0  PLT 254  --  123456    Basic Metabolic Panel:  Recent Labs  Lab 06/15/19 1646 06/15/19 1659 06/17/19 0357  NA 130* 129* 130*  K 4.3 4.3 3.8  CL 95* 94* 96*  CO2 25  --  21*  GLUCOSE 125* 120* 104*  BUN 15 16 11   CREATININE 1.02 1.00 0.78  CALCIUM 9.1  --  9.2   Lipid Panel:     Component Value Date/Time   CHOL 172 06/16/2019 0505   TRIG 56 06/16/2019 0505   HDL 58 06/16/2019 0505   CHOLHDL 3.0 06/16/2019 0505   VLDL 11 06/16/2019 0505   LDLCALC 103 (H) 06/16/2019 0505   HgbA1c:  Lab Results  Component Value Date   HGBA1C 5.8 (H) 06/16/2019   Urine Drug Screen: No results found for: LABOPIA, COCAINSCRNUR, LABBENZ, AMPHETMU, THCU, LABBARB  Alcohol Level     Component Value Date/Time   ETH <10 06/16/2019 0505    IMAGING Ct Head Code Stroke Wo Contrast 06/15/2019 1658 No acute finding by CT.  Atrophy and chronic small-vessel ischemic changes of the white matter. *ASPECTS is 10. *   Ct Angio Head W Or Wo Contrast Ct Angio Neck W Or Wo Contrast 06/15/2019 1727 No large or medium vessel occlusion. No atherosclerotic disease at the carotid bifurcations. Ordinary nonstenotic calcification in the carotid siphon regions.   MRI  No results found.  2D Echocardiogram Ejection fraction 60-65%.  Impaired left ventricular diastolic filling.  EEG  This study is suggestive of cortical dysfunction in the left frontotemporal region, which could be secondary to underlying structural abnormality like an acute infarct.  Additionally, there is evidence of moderate diffuse encephalopathy. No seizures or epileptiform discharges were seen throughout the recording.  LT EEG This study issuggestive of cortical dysfunction in the left frontotemporal region, which could be secondary to underlying structural abnormality like an acute infarct.Additionally, there is evidence of moderate diffuse encephalopathy. No seizures or epileptiform discharges were seen throughout the recording.  PHYSICAL EXAM    Pleasant elderly Caucasian male not in distress.  Appears restless. . Afebrile. Head is nontraumatic. Neck is supple without bruit.    Cardiac exam no murmur or  gallop. Lungs are clear to auscultation. Distal pulses are well felt. Neurological Exam :  He is awake alert with mild expressive   aphasia with some word hesitancy and nonfluent speech and word finding difficulties.  His repetition is better preserved.  Speaks  short sentences with  .    Follows most commands well.. . On clock drawing he scored 4/ He does not appear to have significant visual agnosia today.  Extraocular movements are full range without nystagmus.  He blinks to threat on  both sides today there appears to be some right sided neglect.  Face is symmetric without weakness tongue is midline.  Motor system exam reveals no upper or lower  extremity drift and symmetric and equal strength in all 4 extremities.  Sensation and coordination difficult to test.  Gait not tested.  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 Received tPA 06/15/2019 at 1700.    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 Small vessel disease. Atrophy. ASPECTS 10.     CTA head & neck calcification at ICA siphons only  MRI pending  2D Echo pending   EEG no sz. LT EEG no sz, L frontotemporal slowing  LDL 103  HgbA1c 5.8  SCDs for VTE prophylaxis  aspirin 81 mg daily prior to admission, now on No antithrombotic add  Aspirin 81 mg and plavix 75 mg daily x 3 weeks and then aspirin alone   Therapy recommendations:  pending. Ok to be OOB  Disposition:  pending   Blood Pressure  Home meds:  noone BP goal per post tPA protocol x 24h following tPA administration  Hyperlipidemia  Home meds:  zetia 10  Developed confusion on atorvastatin, now a statin candidate  LDL 103, goal < 70  Resume zetia once able to swallow  Dysphagia . Secondary to stroke . NPO . Speech on board   Other Stroke Risk Factors  Advanced age  ETOH use, alcohol level <10, wife advised for him to drink no more than 2 drink(s) a day  Family hx stroke (father)  Coronary artery disease   Other Active Problems  Prostate cancer s/p prostatectomy ->radiation->Lupron  CKD  Hyponatremia 129  Agitation, improved after bladder emptied     Urinary retention, I&O cath  during the night following some agitation, which resolved after the cath  Progressive neuro issues over last several months. Followed by Dr. Brett Fairy. No definite etiology found.   several episodes of transient confusional episodes.    last Mini-Mental status exam score was 25/30 on 06/08/2019 at last visit with Dr. Brett Fairy.  neuropsychological test battery which suggested posterior cortical atrophy variant.    metabolic PET scan [id number] no diminished bilateral cortical metabolism to raise concern about Alzheimer's.    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    CSF cell count was normal at 4 and protein also normal at 51 mg percent.    Hospital day # 3  Plan: start keppra Xr 500 mg daily Transfer to inpatient rehab for  ongoing therapies.  Aspirin and Plavix for 3 weeks followed by aspirin alone.  .  Long discussion with wife at the bedside and Dr Naaman Plummer and answered questions.    Antony Contras, MD Medical Director [facility] Pager: [phone] 06/18/2019 3:02 PM   To contact Stroke Continuity provider, please refer to [email or web address] After hours, contact General Neurology

## 2019-06-18 NOTE — Progress Notes (Signed)
LTM discontinued; no skin breakdown was seen. Advised patient on how to remove any remaining glue particles from hair.

## 2019-06-18 NOTE — H&P (Signed)
Physical Medicine and Rehabilitation Admission H&P        Chief Complaint  Patient presents with   Code Stroke  : HPI: Spencer Underwood is a 78 year old right-handed male with history of hyperlipidemia, prostate cancer 2008 with prostatectomy as well as radiation therapy and Lupron injections every 6 months, CAD maintained on low-dose aspirin and syncope.  Per chart review patient lives with spouse.  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  Presented 06/15/2019 with altered mental status and speech difficulty.  Per report and discussion with wife patient has had progressive neurological issues for the last several months and has been seen outpatient neurology by Dr. Brett Fairy with several CT scans of the head showing no definite etiology.  No history of documented seizure or seizure-like episodes.  He had a PET scan done 04/06/2019 which did not suggest any diminished bilateral cortical metabolism to raise concern about Alzheimer's.  Patient has had neuropsychological test battery recently which suggested posterior cortical atrophy variant.  His last Mini-Mental status exam was 25 out of 30 on 06/08/2019 with his visit to neurology services Dr. Brett Fairy.  Latest chemistries upon admission with sodium 130, COVID negative, hemoglobin 13.6, WBC 5.1.  Cranial CT scan 06/15/2019 showed no acute findings by CT.  Noted atrophy and chronic small vessel ischemic changes of the white matter.  CT angiogram of head and neck with no large or medium vessel occlusion.  MRI did not identify any acute intracranial abnormality or significant interval change.  Patient  receive TPA.  Echocardiogram with ejection fraction of 65% without emboli.  EEG 06/17/2019 completed suggestive of cortical dysfunction in the left frontotemporal region nonspecific etiology no seizure activity seen. This EEG demonstrated improvement compared to 10/14 as frontotemporal slowing was now intermittent.--?post-ictal sate.   Placed on Keppra for seizure prophylaxis.  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   Currently maintained on aspirin 81 mg daily and Plavix 75 mg daily x3 weeks then aspirin alone.  Patient is tolerating a regular consistency diet.  There have been intermittent bouts of agitation and restlessness noted.  Therapy evaluations completed and patient was admitted for a comprehensive rehab program.   Review of Systems  Constitutional: Negative for chills and fever.  HENT: Negative for hearing loss.   Eyes: Negative for blurred vision and double vision.  Respiratory: Negative for cough and shortness of breath.   Cardiovascular: Positive for palpitations. Negative for chest pain and leg swelling.  Gastrointestinal: Positive for constipation. Negative for heartburn, nausea and vomiting.  Genitourinary: Positive for urgency. Negative for dysuria, flank pain and hematuria.  Musculoskeletal: Positive for joint pain and myalgias.  Skin: Negative for rash.  Neurological: Positive for speech change.       Intermittent bouts of syncope and dizziness in the past  Psychiatric/Behavioral: Positive for memory loss. The patient has insomnia.   All other systems reviewed and are negative.       Past Medical History:  Diagnosis Date   Allergy     Arthritis     Cancer [facility])     Cataract     Chronic kidney disease      atrophic left kidney   Coronary atherosclerosis      a. 2012 - incidentally noted on CT scan of chest;  b. 08/2011 Myoview: normal; c. 03/2015 CPX @ [facility]: normal w/ PAC's, PVC's, and a 3  beat run of NSVT in recovery.   Heart murmur      asymptomatic function murmur.   History of echocardiogram      a. 12/2004 Echo: Nl EF, trace TR/MR.   Hx of radiation therapy     Hyperlipidemia     Personal history of malignant neoplasm of prostate       a. 2008 s/p prostatecomtomy-->radiation-->currently on Lupron injections.   Sinus bradycardia     Syncope      a. 12/2016         Past Surgical History:  Procedure Laterality Date   PROSTATECTOMY       ROBOT ASSISTED LAPAROSCOPIC RADICAL PROSTATECTOMY       SHOULDER SURGERY Right     SKIN GRAFT Left     TONSILLECTOMY             Family History  Problem Relation Age of Onset   Hypertension Mother     Thyroid disease Mother     Stroke Father     Coronary artery disease Father     Colon cancer Neg Hx     Esophageal cancer Neg Hx     Pancreatic cancer Neg Hx     Rectal cancer Neg Hx     Stomach cancer Neg Hx      Social History:  reports that he has never smoked. He has never used smokeless tobacco. He reports current alcohol use of about 1.0 standard drinks of alcohol per week. He reports that he does not use drugs. Allergies:       Allergies  Allergen Reactions   Atorvastatin Other (See Comments)      Caused confusion             Facility-Administered Medications Prior to Admission  Medication Dose Route Frequency Provider Last Rate Last Dose   0.9 %  sodium chloride infusion  500 mL Intravenous Continuous Irene Shipper, MD              Medications Prior to Admission  Medication Sig Dispense Refill   aspirin EC 81 MG tablet Take 81 mg by mouth daily.       ezetimibe (ZETIA) 10 MG tablet Take 10 mg by mouth daily.       leuprolide (LUPRON) 30 MG injection Inject 30 mg into the muscle See admin instructions. Inject 30 mg IM every 6 months       Misc Natural Products (OSTEO BI-FLEX ADV JOINT SHIELD PO) Take 1 tablet by mouth daily.        Multiple Vitamins-Minerals (CENTRUM SILVER PO) Take 1 tablet by mouth daily.       ibuprofen (ADVIL,MOTRIN) 400 MG tablet Take 1 tablet (400 mg total) by mouth every 4 (four) hours as needed for mild pain or moderate pain. (Patient not taking: Reported on 06/15/2019) 30 tablet 0      Drug Regimen Review Drug  regimen was reviewed and remains appropriate with no significant issues identified   Home: Home Living Family/patient expects to be discharged to:: Private residence Living Arrangements: Spouse/significant other Available Help at Discharge: Family Type of Home: House Additional Comments: Pt reports he lives with his wife  Lives With: Spouse   Functional History: Prior Function Level of Independence: Independent   Functional Status:  Mobility: Bed Mobility Overal bed mobility: Needs Assistance Bed Mobility: Supine to Sit Supine to sit: Min guard, +2 for safety/equipment General bed mobility comments: Close guard for lines/leads; Cues to initiate Transfers Overall transfer level:  Needs assistance Equipment used: None Transfers: Sit to/from Stand Sit to Stand: Min guard General transfer comment: Very close guard for safety, lines/leads; Stood impulsively; No physical assist to power up; noted braced LEs against bed, indicative of incr fall risk Ambulation/Gait Ambulation/Gait assistance: Mod assist Gait Distance (Feet): 125 Feet Assistive device: 1 person hand held assist, Rolling walker (2 wheeled) Gait Pattern/deviations: Decreased stride length, Scissoring, Narrow base of support General Gait Details: Initially ambulating with no assistive device, pt demonstrating heavy scissoring and posterior lean, with several lateral LOB requiring assist by PT to correct. Rest of ambulation completed with walker and moderate assist for stability. Pt with very narrow BOS, scissoring, decreased push off in terminal stance; does not correct significantly with cues. Requires cues for direction, sequencing   ADL: ADL Overall ADL's : Needs assistance/impaired Eating/Feeding: Set up, Supervision/ safety, Sitting Grooming: Set up, Supervision/safety, Sitting Grooming Details (indicate cue type and reason): Cues for sequencing Upper Body Bathing: Minimal assistance, Sitting Lower Body Bathing:  Sit to/from stand, Moderate assistance Lower Body Bathing Details (indicate cue type and reason): Mod A for standing balance Upper Body Dressing : Minimal assistance, Sitting Lower Body Dressing: Sit to/from stand, Moderate assistance Lower Body Dressing Details (indicate cue type and reason): Pt donning socks at EOB with Min A for sitting balance and Mod cues for sequencing. Mod A for standing balance Toilet Transfer: Moderate assistance, +2 for physical assistance, Stand-pivot(simulated to recliner) Toilet Transfer Details (indicate cue type and reason): Min A to power up and then Mod A for gaining balance in standing. Min Guard A for safety in pivot to recliner. +2 throughout Functional mobility during ADLs: Moderate assistance(stand pivot) General ADL Comments: Pt presenting with decreased balance, cognition, strength, and activity tolerance.    Cognition: Cognition Overall Cognitive Status: Impaired/Different from baseline Arousal/Alertness: Awake/alert Orientation Level: Oriented to person, Oriented to place Attention: Selective Selective Attention: Impaired Selective Attention Impairment: Verbal basic, Functional basic Memory: (TBA- impaired at baseline) Awareness: Impaired Awareness Impairment: Anticipatory impairment Problem Solving: Impaired Problem Solving Impairment: Verbal basic, Functional basic Executive Function: Initiating, Self Monitoring, Self Correcting Initiating: Impaired Initiating Impairment: Verbal basic Behaviors: Other (comment)(Became frustrated with performance ) Safety/Judgment: Impaired(Tried to get out of chair without assist) Cognition Arousal/Alertness: Awake/alert Behavior During Therapy: Impulsive Overall Cognitive Status: Impaired/Different from baseline Area of Impairment: Orientation, Attention, Following commands, Safety/judgement, Awareness, Problem solving, Memory Orientation Level: Disoriented to, Place, Time, Situation Current Attention  Level: Sustained Memory: Decreased short-term memory Following Commands: Follows one step commands with increased time(and occasional reminders) Safety/Judgement: Decreased awareness of safety, Decreased awareness of deficits Awareness: Intellectual Problem Solving: Difficulty sequencing, Requires verbal cues, Requires tactile cues, Slow processing General Comments: Pt A&Ox1, oriented to self, unable to correctly answer place or month when given options or context i.e. asked if month was January, March or October and pt stating March. Pt is impulsive and requires cues for safety   Physical Exam: Blood pressure 122/75, pulse (!) 47, temperature 98 F (36.7 C), temperature source Oral, resp. rate 16, height 5\' 9"  (1.753 m), weight 69.5 kg, SpO2 100 %. Physical Exam  Constitutional: He appears well-developed and well-nourished. No distress.  HENT:  Head: Normocephalic and atraumatic.  Eyes: Pupils are equal, round, and reactive to light. EOM are normal.  Neck: Normal range of motion. No tracheal deviation present. No thyromegaly present.  Cardiovascular: Normal rate and regular rhythm. Exam reveals no friction rub.  No murmur heard. Respiratory: Effort normal. No respiratory distress. He has no wheezes.  GI: Soft. He exhibits no distension. There is no abdominal tenderness.  Musculoskeletal: Normal range of motion.  Neurological: He displays normal reflexes. No cranial nerve deficit.  Pt is alert and oriented to month, year and day (with cues). Mild word finding deficits and motor planning delays. No PD but decreased [place] of RUE and RLE. Strength grossly 4+/5 in all 4's.  No gross sensory deficits.   Skin: Skin is warm and dry.  Psychiatric:  Flat but generally pleasant and cooperative      Lab Results Last 48 Hours        Results for orders placed or performed during the hospital encounter of 06/15/19 (from the past 48 hour(s))  CBC     Status: None    Collection Time: 06/17/19  3:57 AM   Result Value Ref Range    WBC 7.4 4.0 - 10.5 K/uL    RBC 4.51 4.22 - 5.81 MIL/uL    Hemoglobin 14.1 13.0 - 17.0 g/dL    HCT 39.7 39.0 - 52.0 %    MCV 88.0 80.0 - 100.0 fL    MCH 31.3 26.0 - 34.0 pg    MCHC 35.5 30.0 - 36.0 g/dL    RDW 12.8 11.5 - 15.5 %    Platelets 215 150 - 400 K/uL    nRBC 0.0 0.0 - 0.2 %      Comment: Performed at [facility], [place] [address] [place], [place] [id number]  Basic metabolic panel     Status: Abnormal    Collection Time: 06/17/19  3:57 AM  Result Value Ref Range    Sodium 130 (L) 135 - 145 mmol/L    Potassium 3.8 3.5 - 5.1 mmol/L    Chloride 96 (L) 98 - 111 mmol/L    CO2 21 (L) 22 - 32 mmol/L    Glucose, Bld 104 (H) 70 - 99 mg/dL    BUN 11 8 - 23 mg/dL    Creatinine, Ser 0.78 0.61 - 1.24 mg/dL    Calcium 9.2 8.9 - 10.3 mg/dL    GFR calc non Af Amer >60 >60 mL/min    GFR calc Af Amer >60 >60 mL/min    Anion gap 13 5 - 15      Comment: Performed at [facility] [address], [place], [place] [address]      Imaging Results (Last 48 hours)  Mr Brain Wo Contrast   Addendum Date: 06/16/2019   ADDENDUM REPORT: 06/16/2019 21:02 ADDENDUM: A 4 mm subcortical infarct is present in the right left frontal operculum on image 36 of series 2. Findings were discussed with Dr. Leonie Man at 5:45 p.m. Electronically Signed   By: San Morelle M.D.   On: 06/16/2019 21:02    Result Date: 06/16/2019 CLINICAL DATA:  Stroke, follow-up. Status post tPA. Slurred speech. EXAM: MRI HEAD WITHOUT CONTRAST TECHNIQUE: Multiplanar, multiecho pulse sequences of the brain and surrounding structures were obtained without intravenous contrast. COMPARISON:  CT head and CTA head and neck head/13/20. MR head without and with contrast 01/31/2019. FINDINGS: Brain: Diffusion-weighted images demonstrate no acute or subacute infarction. Mild generalized atrophy and white matter disease is stable. No acute hemorrhage or mass lesion is present. The ventricles are of normal  size. No significant extraaxial fluid collection is present. Dilated perivascular spaces are present in the basal ganglia. The brainstem and cerebellum are within normal limits. Vascular: Flow is present in the major intracranial arteries. Skull and upper cervical spine: The craniocervical junction  is normal. Upper cervical spine is within normal limits. Marrow signal is unremarkable. Sinuses/Orbits: The paranasal sinuses and mastoid air cells are clear. Left lens replacement is noted. Globes and orbits are otherwise within normal limits. IMPRESSION: 1. No acute intracranial abnormality or significant interval change. 2. Stable atrophy and white matter disease. Electronically Signed: By: San Morelle M.D. On: 06/16/2019 17:18            Medical Problem List and Plan: 1.  Gait dysfunction with speech difficulty secondary to complicated seizure with encephalopathy as well as incidental findings of small left middle cerebral artery branch abnormality likely embolic etiology, pt status post TPA.             -admit to inpatient rehab             -ELOS 7-10 days, supervision to mod I goals             -monitor for any further episodes of altered mental status, aphasia, etc 2.  Antithrombotics: -DVT/anticoagulation: SCDs.             -antiplatelet therapy: Aspirin 81 mg daily and Plavix 75 mg daily x3 weeks then aspirin alone 3. Pain Management: Tylenol as needed 4. Mood: Provide emotional support             -antipsychotic agents: N/A 5. Neuropsych: This patient is capable of making decisions on his own behalf. 6. Skin/Wound Care: Routine skin checks 7. Fluids/Electrolytes/Nutrition: Routine in and outs with follow-up chemistries 8.  Seizure prophylaxis.  Keppra XR 500 mg daily initiated 9.  Hyperlipidemia.  Zetia 10.  History of prostate cancer 2008 status post prostatectomy as well as radiation therapy.  Receives Lupron injection 30 mg every 6 months 11.OSA.  Patient had been on CPAP at  home.  Will have wife bring in mask       Cathlyn Parsons, PA-C 06/18/2019  I have personally performed a face to face diagnostic evaluation of this patient and formulated the key components of the plan.  Additionally, I have personally reviewed laboratory data, imaging studies, as well as relevant notes and concur with the physician assistant's documentation above.  The patient's status has not changed from the original H&P.  Any changes in documentation from the acute care chart have been noted above.  Meredith Staggers, MD, [facility]rown

## 2019-06-18 NOTE — Progress Notes (Signed)
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 Date: a 05/03/2010 b 03/03/2011     Deduct: $1408      Out of Pocket Max: none      Life Max: none CIR: 100%      SNF: 20 full days Outpatient: 80%     Co-Pay: 20% Home Health: 100%      Co-Pay: none DME: 805     Co-Pay: 20% Providers: pt choice  SECONDARY: BCBS supplement      Policy#: [id number]      Subscriber: pt  Medicaid Application Date:       Case Manager:  Disability Application Date:       Case Worker:   The "Data Collection Information Summary" for patients in Inpatient Rehabilitation Facilities with attached "Privacy Act Statement-Health Care Records" was provided and verbally reviewed with: Patient and Family  Emergency Contact Information         Contact Information    Name Relation Home Work Mobile   Stanfill,Jane Spouse [phone]  [phone]   Edwards,Kristrin Daughter [phone]        Current Medical History  Patient Admitting Diagnosis: encephalopathy, seizure disorder  History of Present Illness:  78-year-old right-handed male with history of hyperlipidemia, prostate cancer 2008 with prostatectomy as well as radiation therapy and Lupron injections every 6  months, CAD maintained on low-dose aspirin and syncope. Presented 06/15/2019 with altered mental status and speech difficulty. Per report and discussion with wife patient has had progressive neurological issues for the last several months and has been seen outpatient neurology by Dr. Dohmeier with several CT scans of the head showing no definite etiology. No history of documented seizure or seizure-like episodes. He had a PET scan done 04/06/2019 which did not suggest any diminished bilateral cortical metabolism to raise concern about Alzheimer's. Patient has had neuropsychological test battery recently which suggested posterior cortical atrophy variant. His last Mini-Mental status exam was 25 out of 30 on 06/08/2019 with his visit to neurology services Dr. Dohmeier. Latest chemistries upon admission with sodium 130, COVID negative, hemoglobin 13.6, WBC 5.1. Cranial CT scan 06/15/2019 showed no acute findings by CT. Noted atrophy and chronic small vessel ischemic changes of the white matter. CT angiogram of head and neck with no large or medium vessel occlusion. MRI did not identify any acute intracranial abnormality or significant interval change. Patient receive TPA. Echocardiogram with ejection fraction of 65% without emboli. 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 Currently maintained on aspirin 81 mg daily and Plavix 75 mg daily x3 weeks then aspirin alone. Patient is tolerating a regular consistency diet. There have been intermittent bouts of agitation and restlessness noted.   Complete NIHSS TOTAL: 3  Patient's medical record from [facility] has been  reviewed by the rehabilitation admission coordinator and physician.  Past Medical History      Past Medical History:  Diagnosis Date  . Allergy   . Arthritis   . Cancer (HCC)   . Cataract   . Chronic kidney disease    atrophic left kidney  . Coronary atherosclerosis    a. 2012 - incidentally noted on CT scan of chest;  b. 08/2011 Myoview: normal; c. 03/2015 CPX @ [facility]: normal w/ PAC's, PVC's, and a 3 beat run of NSVT in recovery.  . Heart murmur    asymptomatic function murmur.  . History of echocardiogram    a. 12/2004 Echo: Nl EF, trace TR/MR.  . Hx of radiation therapy   . Hyperlipidemia   . Personal history of malignant neoplasm of prostate    a. 2008 s/p prostatecomtomy-->radiation-->currently on Lupron injections.  . Sinus bradycardia   . Syncope    a. 12/2016    Family History   family history includes Coronary artery disease in his father; Hypertension in his mother; Stroke in his father; Thyroid disease in his mother.  Prior Rehab/Hospitalizations Has the patient had prior rehab or hospitalizations prior to admission? Yes  Has the patient had major surgery during 100 days prior to admission? No              Current Medications  Current Facility-Administered Medications:  .  0.9 %  sodium chloride infusion, , Intravenous, Continuous, Sethi, Pramod S, MD, Last Rate: 75 mL/hr at 06/17/19 0400 .  acetaminophen (TYLENOL) tablet 650 mg, 650 mg, Oral, Q4H PRN **OR** acetaminophen (TYLENOL) solution 650 mg, 650 mg, Per Tube, Q4H PRN **OR** acetaminophen (TYLENOL) suppository 650 mg, 650 mg, Rectal, Q4H PRN, Sethi, Pramod S, MD .  aspirin EC tablet 81 mg, 81 mg, Oral, Daily, Biby, Sharon L, NP, 81 mg at 06/18/19 1009 .  Chlorhexidine Gluconate Cloth 2 % PADS 6 each, 6 each, Topical, Daily, Aroor, Sushanth R, MD, 6 each at 06/15/19 1842 .  clopidogrel (PLAVIX) tablet 75 mg, 75 mg, Oral, Daily, Biby, Sharon L, NP, 75 mg at 06/18/19 1009 .  levETIRAcetam  (KEPPRA XR) 24 hr tablet 500 mg, 500 mg, Oral, Daily, Sethi, Pramod S, MD, 500 mg at 06/18/19 1206 .  pantoprazole (PROTONIX) EC tablet 40 mg, 40 mg, Oral, QHS, Sethi, Pramod S, MD, 40 mg at 06/17/19 2208 .  senna-docusate (Senokot-S) tablet 1 tablet, 1 tablet, Oral, QHS PRN, Sethi, Pramod S, MD  Patients Current Diet:     Diet Order                  Diet regular Room service appropriate? Yes; Fluid consistency: Thin  Diet effective now               Precautions / Restrictions Precautions Precautions: Fall Restrictions Weight Bearing Restrictions: No   Has the patient had 2 or more falls or a fall with injury in the past year? No  Prior Activity Level Limited Community (1-2x/wk): (very active, independent; gradual decline since March)  Prior Functional Level Self Care: Did the patient need   help bathing, dressing, using the toilet or eating? Independent  Indoor Mobility: Did the patient need assistance with walking from room to room (with or without device)? Independent  Stairs: Did the patient need assistance with internal or external stairs (with or without device)? Independent  Functional Cognition: Did the patient need help planning regular tasks such as shopping or remembering to take medications? Needed some help  Home Assistive Devices / Equipment  Prior Device Use: Indicate devices/aids used by the patient prior to current illness, exacerbation or injury? None of the above  Current Functional Level Cognition  Arousal/Alertness: Awake/alert Overall Cognitive Status: Impaired/Different from baseline Current Attention Level: Sustained Orientation Level: Oriented X4 Following Commands: Follows one step commands with increased time(and occasional reminders) Safety/Judgement: Decreased awareness of safety, Decreased awareness of deficits General Comments: Pt A&Ox1, oriented to self, unable to correctly answer place or month when given options or  context i.e. asked if month was January, March or October and pt stating March. Pt is impulsive and requires cues for safety Attention: Selective Selective Attention: Impaired Selective Attention Impairment: Verbal basic, Functional basic Memory: (TBA- impaired at baseline) Awareness: Impaired Awareness Impairment: Anticipatory impairment Problem Solving: Impaired Problem Solving Impairment: Verbal basic, Functional basic Executive Function: Initiating, Self Monitoring, Self Correcting Initiating: Impaired Initiating Impairment: Verbal basic Behaviors: Other (comment)(Became frustrated with performance ) Safety/Judgment: Impaired(Tried to get out of chair without assist)    Extremity Assessment (includes Sensation/Coordination)  Upper Extremity Assessment: RUE deficits/detail RUE Deficits / Details: Noting decreased grasp strength adn gross motor strength compared to LUE. Pt right handed. Noting decreased fine motor coorindation. Unable to perform finger opposition due to cognitive and coordination deficits RUE Coordination: decreased fine motor, decreased gross motor  Lower Extremity Assessment: Defer to PT evaluation    ADLs  Overall ADL's : Needs assistance/impaired Eating/Feeding: Set up, Supervision/ safety, Sitting Grooming: Set up, Supervision/safety, Sitting Grooming Details (indicate cue type and reason): Cues for sequencing Upper Body Bathing: Minimal assistance, Sitting Lower Body Bathing: Sit to/from stand, Moderate assistance Lower Body Bathing Details (indicate cue type and reason): Mod A for standing balance Upper Body Dressing : Minimal assistance, Sitting Lower Body Dressing: Sit to/from stand, Moderate assistance Lower Body Dressing Details (indicate cue type and reason): Pt donning socks at EOB with Min A for sitting balance and Mod cues for sequencing. Mod A for standing balance Toilet Transfer: Moderate assistance, +2 for physical assistance,  Stand-pivot(simulated to recliner) Toilet Transfer Details (indicate cue type and reason): Min A to power up and then Mod A for gaining balance in standing. Min Guard A for safety in pivot to recliner. +2 throughout Functional mobility during ADLs: Moderate assistance(stand pivot) General ADL Comments: Pt presenting with decreased balance, cognition, strength, and activity tolerance.     Mobility  Overal bed mobility: Needs Assistance Bed Mobility: Supine to Sit Supine to sit: Min guard, +2 for safety/equipment General bed mobility comments: Close guard for lines/leads; Cues to initiate    Transfers  Overall transfer level: Needs assistance Equipment used: None Transfers: Sit to/from Stand Sit to Stand: Min guard General transfer comment: Very close guard for safety, lines/leads; Stood impulsively; No physical assist to power up; noted braced LEs against bed, indicative of incr fall risk    Ambulation / Gait / Stairs / Wheelchair Mobility  Ambulation/Gait Ambulation/Gait assistance: Mod assist Gait Distance (Feet): 125 Feet Assistive device: 1 person hand held assist, Rolling walker (2 wheeled) Gait Pattern/deviations: Decreased stride length, Scissoring, Narrow base of support General Gait  Details: Initially ambulating with no assistive device, pt demonstrating heavy scissoring and posterior lean, with several lateral LOB requiring assist by PT to correct. Rest of ambulation completed with walker and moderate assist for stability. Pt with very narrow BOS, scissoring, decreased push off in terminal stance; does not correct significantly with cues. Requires cues for direction, sequencing    Posture / Balance Balance Overall balance assessment: Needs assistance Sitting-balance support: No upper extremity supported, Feet supported Sitting balance-Leahy Scale: Fair Standing balance support: Bilateral upper extremity supported Standing balance-Leahy Scale: Poor Standing balance  comment: Reliant on physical A    Special needs/care consideration BiPAP/CPAP yes, began 02/2019 CPM  N/a Continuous Drip IV  N/a Dialysis n/a Life Vest  N/a Oxygen  N/a Special Bed  N/a Trach Size  N/a Wound Vac n/a Skin  intact Bowel mgmt:  No LBM documented Bladder mgmt: external catheter Diabetic mgmt: n/a Behavioral consideration n/a Chemo/radiation history for prostate cancer Designated visitor is Opal Sidles, wife   Previous Home Environment  Living Arrangements: Spouse/significant other  Lives With: Spouse Available Help at Discharge: Family, Available 24 hours/day Type of Home: House Home Layout: Two level, Bed/bath upstairs Alternate Level Stairs-Rails: Right, Left(rail on right going up an don left going down) Alternate Level Stairs-Number of Steps: flight Home Access: Stairs to enter Entrance Stairs-Rails: None Entrance Stairs-Number of Steps: 1 step from garage [employer] Shower/Tub: Multimedia programmer: Standard Bathroom Accessibility: Yes How Accessible: Accessible via walker Home Care Services: No Additional Comments: lives with his wife  Discharge Living Setting Plans for Discharge Living Setting: Patient's home, Lives with (comment)(spouse) Type of Home at Discharge: House Discharge Home Layout: Two level, Bed/bath upstairs Alternate Level Stairs-Rails: Right, Left Alternate Level Stairs-Number of Steps: flight Discharge Home Access: Stairs to enter Entrance Stairs-Rails: None Entrance Stairs-Number of Steps: 1 step through garage Discharge Bathroom Shower/Tub: Horticulturist, commercial: Standard Discharge Bathroom Accessibility: Yes How Accessible: Accessible via walker Does the patient have any problems obtaining your medications?: No  Social/Family/Support Systems Patient Roles: Spouse, Parent(retired Dealer) Contact Information: wife, Opal Sidles Anticipated Caregiver: wife Anticipated Ambulance person Information: see  above Ability/Limitations of Caregiver: no limitations Caregiver Availability: 24/7 Discharge Plan Discussed with Primary Caregiver: Yes Is Caregiver In Agreement with Plan?: Yes Does Caregiver/Family have Issues with Lodging/Transportation while Pt is in Rehab?: No  Goals/Additional Needs Patient/Family Goal for Rehab: Mod I to supervision PT, suerpvison OT and SLP Expected length of stay: ELOS 7 to 10 days Pt/Family Agrees to Admission and willing to participate: Yes Program Orientation Provided & Reviewed with Pt/Caregiver Including Roles  & Responsibilities: Yes  Decrease burden of Care through IP rehab admission: n/a  Possible need for SNF placement upon discharge: n/a  Patient Condition: I have reviewed medical records from [facility] , spoken with CSW, and patient and spouse. I met with patient at the bedside for inpatient rehabilitation assessment.  Patient will benefit from ongoing PT, OT and SLP, can actively participate in 3 hours of therapy a day 5 days of the week, and can make measurable gains during the admission.  Patient will also benefit from the coordinated team approach during an Inpatient Acute Rehabilitation admission.  The patient will receive intensive therapy as well as Rehabilitation physician, nursing, social worker, and care management interventions.  Due to bladder management, bowel management, safety, skin/wound care, disease management, medication administration, pain management and patient education the patient requires 24 hour a day rehabilitation nursing.  The patient is currently min to  mod assist with mobility and basic ADLs.  Discharge setting and therapy post discharge at home with outpatient is anticipated.  Patient has agreed to participate in the Acute Inpatient Rehabilitation Program and will admit today.  Preadmission Screen Completed By:  Cleatrice Burke, 06/18/2019 1:21 PM  ______________________________________________________________________   Discussed status with Dr. Naaman Plummer  on  06/18/2019 at 2:10 pm and received approval for admission today.  Admission Coordinator:  Cleatrice Burke, RN, time 2:10pm/Date 06/18/2019   Assessment/Plan: Diagnosis: complicated seizures with encephalopathy 1. Does the need for close, 24 hr/day Medical supervision in concert with the patient's rehab needs make it unreasonable for this patient to be served in a less intensive setting? Yes 2. Co-Morbidities requiring supervision/potential complications: prostate cancer, ckd 3. Due to bladder management, bowel management, safety, skin/wound care, disease management, medication administration, pain management and patient education, does the patient require 24 hr/day rehab nursing? Yes 4. Does the patient require coordinated care of a physician, rehab nurse, PT, OT, and SLP to address physical and functional deficits in the context of the above medical diagnosis(es)? Yes Addressing deficits in the following areas: balance, endurance, locomotion, strength, transferring, bowel/bladder control, bathing, dressing, feeding, grooming, toileting, cognition, language and psychosocial support 5. Can the patient actively participate in an intensive therapy program of at least 3 hrs of therapy 5 days a week? Yes 6. The potential for patient to make measurable gains while on inpatient rehab is excellent 7. Anticipated functional outcomes upon discharge from inpatient rehab: modified independent and supervision PT, modified independent and supervision OT, modified independent and supervision SLP 8. Estimated rehab length of stay to reach the above functional goals is: 7-10 days 9. Anticipated discharge destination: Home 10. Overall Rehab/Functional Prognosis: excellent   MD Signature: Meredith Staggers, MD, [place] 06/18/2019          Revision History

## 2019-06-18 NOTE — Progress Notes (Signed)
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  Ellamae Sia, PT, DPT Acute Rehabilitation Services Pager [phone] Office [phone]    Willy Eddy 06/18/2019, 9:41 AM

## 2019-06-18 NOTE — Discharge Summary (Addendum)
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 Posterior cortical atrophy variant of alzheimers- being followed at [facility] by Dr Darryl Lent   Hyponatremia   Agitation  Past Medical History:  Diagnosis Date  . Allergy   . Arthritis   . Cancer ([place])   . Cataract   . Chronic kidney disease    atrophic left kidney  . Coronary atherosclerosis    a. 2012 - incidentally noted on CT scan of chest;  b. 08/2011 Myoview: normal; c. 03/2015 CPX @ [facility]: normal w/ PAC's, PVC's, and a 3 beat run of NSVT in recovery.  Marland Kitchen Heart murmur    asymptomatic function murmur.  Marland Kitchen History of echocardiogram    a. 12/2004 Echo: Nl EF, trace TR/MR.  Marland Kitchen Hx of radiation therapy   . Hyperlipidemia   . Personal history of malignant neoplasm of prostate    a. 2008 s/p prostatecomtomy-->radiation-->currently on Lupron injections.  . Sinus bradycardia   . Syncope    a. 12/2016   Past Surgical History:  Procedure Laterality Date  . PROSTATECTOMY    . ROBOT ASSISTED LAPAROSCOPIC RADICAL PROSTATECTOMY    . SHOULDER SURGERY Right   . SKIN GRAFT Left   . TONSILLECTOMY      Medications to be continued on Rehab Allergies as of 06/18/2019       Reactions   Atorvastatin Other (See Comments)   Caused confusion      Medication List    STOP taking these medications   ibuprofen 400 MG tablet Commonly known as: ADVIL     TAKE these medications   aspirin EC 81 MG tablet Take 81 mg by mouth daily.   CENTRUM SILVER PO Take 1 tablet by mouth daily.   clopidogrel 75 MG tablet Commonly known as: PLAVIX Take 1 tablet (75 mg total) by mouth daily. Start taking on: June 19, 2019   ezetimibe 10 MG tablet Commonly known as: ZETIA Take 10 mg by mouth daily.   leuprolide 30 MG injection Commonly known as: LUPRON Inject 30 mg into the muscle See admin instructions. Inject 30 mg IM every 6 months   levETIRAcetam 500 MG 24 hr tablet Commonly known as: KEPPRA XR Take 1 tablet (500 mg total) by mouth daily. Start taking on: June 19, 2019   OSTEO BI-FLEX ADV JOINT SHIELD PO Take 1 tablet by mouth daily.       LABORATORY STUDIES CBC    Component Value Date/Time   WBC 7.4 06/17/2019 0357   RBC 4.51 06/17/2019 0357   HGB 14.1 06/17/2019 0357   HCT 39.7 06/17/2019 0357   PLT 215 06/17/2019 0357   MCV 88.0 06/17/2019 0357   MCH 31.3 06/17/2019 0357  MCHC 35.5 06/17/2019 0357   RDW 12.8 06/17/2019 0357   LYMPHSABS 1.2 06/15/2019 1646   MONOABS 0.5 06/15/2019 1646   EOSABS 0.1 06/15/2019 1646   BASOSABS 0.1 06/15/2019 1646   CMP    Component Value Date/Time   NA 130 (L) 06/17/2019 0357   K 3.8 06/17/2019 0357   CL 96 (L) 06/17/2019 0357   CO2 21 (L) 06/17/2019 0357   GLUCOSE 104 (H) 06/17/2019 0357   BUN 11 06/17/2019 0357   CREATININE 0.78 06/17/2019 0357   CALCIUM 9.2 06/17/2019 0357   PROT 6.6 06/15/2019 1646   ALBUMIN 4.2 06/15/2019 1646   AST 26 06/15/2019 1646   ALT 17 06/15/2019 1646   ALKPHOS 44 06/15/2019 1646   BILITOT 0.9 06/15/2019 1646   GFRNONAA >60 06/17/2019 0357   GFRAA >60 06/17/2019 0357   COAGS Lab Results  Component Value Date   INR 1.0 06/15/2019   INR 1.03 09/17/2018   INR  1.0 02/03/2007   Lipid Panel    Component Value Date/Time   CHOL 172 06/16/2019 0505   TRIG 56 06/16/2019 0505   HDL 58 06/16/2019 0505   CHOLHDL 3.0 06/16/2019 0505   VLDL 11 06/16/2019 0505   LDLCALC 103 (H) 06/16/2019 0505   HgbA1C  Lab Results  Component Value Date   HGBA1C 5.8 (H) 06/16/2019   Alcohol Level    Component Value Date/Time   ETH <10 06/16/2019 0505     SIGNIFICANT DIAGNOSTIC STUDIES Ct Head Code Stroke Wo Contrast 06/15/2019 1658 No acute finding by CT. Atrophy and chronic small-vessel ischemic changes of the white matter. *ASPECTS is 10. *   Ct Angio Head W Or Wo Contrast Ct Angio Neck W Or Wo Contrast 06/15/2019 1727 No large or medium vessel occlusion. No atherosclerotic disease at the carotid bifurcations. Ordinary nonstenotic calcification in the carotid siphon regions.   Mr Brain Wo Contrast 06/16/2019   1. A 4 mm subcortical infarct is present in the right left frontal operculum  2. Stable atrophy and white matter disease.    2D Echocardiogram 1. Left ventricular ejection fraction, by visual estimation, is 60 to 65%. The left ventricle has normal function. Normal left ventricular size. There is no left ventricular hypertrophy.  2. Left ventricular diastolic Doppler parameters are consistent with impaired relaxation pattern of LV diastolic filling.  3. Global right ventricle has normal systolic function.The right ventricular size is normal.  4. Left atrial size was normal.  5. Right atrial size was normal.  6. The mitral valve is normal in structure. Mild mitral valve regurgitation. No evidence of mitral stenosis.  7. The tricuspid valve is normal in structure. Tricuspid valve regurgitation is mild.  8. The aortic valve is tricuspid Aortic valve regurgitation was not visualized by color flow Doppler. Mild aortic valve sclerosis without stenosis.  9. The pulmonic valve was normal in structure. Pulmonic valve regurgitation is mild by color flow  Doppler. 10. The inferior vena cava is normal in size with greater than 50% respiratory variability, suggesting right atrial pressure of 3 mmHg. 11. Normal LV systolic function; grade 1 diastolic dysfunction.  EEG  06/15/2019 This study issuggestive of cortical dysfunction in the left frontotemporal region, which could be secondary to underlying structural abnormality like an acute infarct.Additionally, there is evidence of moderate diffuse encephalopathy. No seizures or epileptiform discharges were seen throughout the recording.  LT EEG 06/16/2019 This study issuggestive of cortical dysfunction in the left frontotemporal region, which could be secondary to underlying structural  abnormality like an acute infarct.Additionally, there is evidence of moderate diffuse encephalopathy. No seizures or epileptiform discharges were seen throughout the recording.  EEG 06/17/2019 This study isissuggestive of cortical dysfunction in the left frontotemporal region. Additionally, there is evidence of milddiffuse encephalopathy. No seizures or epileptiform discharges were seen throughout the recording.  This EEG appears improved compared to prior study on 06/16/2019 as the continuous left frontotemporal slowing has reduced is now replaced by intermittent slowing. Even though no clear epileptiform discharges were seen, given clinical history its possible that patient had a seizure and the first EEG on 06/16/2019 showed slowing likely secondary to postictal state which has since been improving.     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  This occurred while he was walking outside with his wife.  Patient has trouble speaking communicating and some slurred speech since then.  There was no extremity weakness numbness gait difficulty or significant headache. NIHSS 8. IV tPA was given and he was admitted to the neuro ICU.   The patient's wife says that he has been having some progressive neurological issues for the last several months for which he was evaluated by Dr. Brett Fairy and has had several scans but no definite etiology has been found.  There is no history of documented seizure or seizure-like episode. He had a metabolic PET scan done on 04/06/2019 which did not suggest any diminished bilateral cortical metabolism to raise concern about Alzheimer's.  A poor protein EEG genotyping was positive for 1 copy of the echo E4 variant which is associated with increased risk of late onset Alzheimer's however ABThera 42/40 ratio was 0.20 which suggest high risk for Alzheimer's.  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  CSF cell count was normal at 4 and protein also normal at 51 mg percent. Patient's wife states he has had several episodes of transient confusional episodes.  He had neuropsychological test battery which suggested posterior cortical atrophy variant.  His last Mini-Mental status exam score was 25/30 on 06/08/2019 at last visit with Dr. Brett Fairy.  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 Received tPA 06/15/2019 at 1700.   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 Small vessel disease. Atrophy. ASPECTS 10.     CTA head & neck calcification at ICA siphons only  MRI pending  2D Echo pending   EEG no sz.   LT EEG no sz, L frontotemporal slowing  Repeat LE EEG improved compared to 10/14 as L frontotemporal slowing replaced by intermittent slowing. No clear epileptiform discharges seen. Likely postictal state.  LDL 103  HgbA1c 5.8  aspirin 81 mg daily  prior to admission, now on No antithrombotic add  Aspirin 81 mg and plavix 75 mg daily x 3 weeks and then PLAVIX alone   Therapy recommendations:  CIR  Disposition:  CIR  Hyperlipidemia  Home meds:  zetia 10  Developed confusion on atorvastatin, not a statin candidate    LDL 103, goal < 70  Resume zetia once able to swallow  Other Stroke Risk Factors  Advanced age  ETOH use, alcohol level <10, advised for him to drink no more than 2 drink(s) a day  Family hx stroke (father)  Coronary artery disease  obstructive sleep apnea on CPAP at home   Other Active Problems  Prostate cancer s/p prostatectomy ->radiation->Lupron  CKD  Hyponatremia 129  Agitation, improved after bladder emptied     Urinary retention, I&O cath during following agitation, both resolved after the cath  Progressive neuro issues over last several months. Followed by Dr. Brett Fairy. No definite etiology found.  ? several episodes of transient confusional episodes.  ? last Mini-Mental status exam score was 25/30 on 06/08/2019 at last visit with Dr. Brett Fairy. ? neuropsychological test battery which suggested posterior cortical atrophy variant. ? metabolic PET scan [id number] no diminished bilateral cortical metabolism to raise concern about Alzheimer's.  ? 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  ? 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  ? CSF cell count was normal at 4 and protein also normal at 51 mg percent.   DISCHARGE EXAM Blood pressure 127/71, pulse (!) 54, temperature 97.8 F (36.6 C), temperature source Oral, resp. rate 20, height 5\' 9"  (1.753 m), weight 69.5 kg, SpO2 99 %. Pleasant elderly Caucasian male not in distress.  Appears restless. . Afebrile. Head is nontraumatic. Neck is supple without bruit.    Cardiac exam no murmur or gallop. Lungs are clear to auscultation. Distal pulses are well felt. Neurological Exam :  He is awake alert with expressive greater than receptive aphasia with some word hesitancy and nonfluent speech and word finding difficulties.  His repetition is better preserved.  Speaks  short sentences with  .  Nonfluent speech.  Follows most commands well.. . On clock drawing he scored 3/4 he was able to copy intersecting pentagons with some difficulty on the second attempt.  He does not appear to have significant visual agnosia today.  Extraocular movements are full range without nystagmus.  He blinks to threat on  both sides today there appears to be some right sided neglect.  Face is symmetric without weakness tongue is midline.  Motor system exam reveals no upper or lower extremity drift and symmetric and equal strength in all 4 extremities.  Sensation and coordination difficult to test.  Gait not tested.  Discharge Diet  Regular thin liquids  DISCHARGE PLAN  Disposition:  Transfer to [facility] for ongoing PT, OT and ST  aspirin 81 mg daily and clopidogrel 75 mg daily for secondary stroke prevention for 3 weeks then PLAVIX alone.  New Keppra XR 500 mg daily  Recommend ongoing stroke risk factor control by Primary Care Physician at time of discharge from inpatient rehabilitation.  Follow-up [name], MD in 2 weeks following discharge from rehab.  Follow-up Dr. Brett Fairy at [facility] 08/09/2019 at 1400, appt  already scheduled.  40 minutes were spent preparing discharge.  Burnetta Sabin, MSN, APRN, ANVP-BC, AGPCNP-BC Advanced Practice Stroke Nurse [place] for Schedule & Pager information 06/18/2019 2:12 PM

## 2019-06-18 NOTE — H&P (Addendum)
Physical Medicine and Rehabilitation Admission H&P    Chief Complaint  Patient presents with   Code Stroke  : HPI: Spencer Underwood is a 78 year old right-handed male with history of hyperlipidemia, prostate cancer 2008 with prostatectomy as well as radiation therapy and Lupron injections every 6 months, CAD maintained on low-dose aspirin and syncope.  Per chart review patient lives with spouse.  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  Presented 06/15/2019 with altered mental status and speech difficulty.  Per report and discussion with wife patient has had progressive neurological issues for the last several months and has been seen outpatient neurology by Dr. Brett Fairy with several CT scans of the head showing no definite etiology.  No history of documented seizure or seizure-like episodes.  He had a PET scan done 04/06/2019 which did not suggest any diminished bilateral cortical metabolism to raise concern about Alzheimer's.  Patient has had neuropsychological test battery recently which suggested posterior cortical atrophy variant.  His last Mini-Mental status exam was 25 out of 30 on 06/08/2019 with his visit to neurology services Dr. Brett Fairy.  Latest chemistries upon admission with sodium 130, COVID negative, hemoglobin 13.6, WBC 5.1.  Cranial CT scan 06/15/2019 showed no acute findings by CT.  Noted atrophy and chronic small vessel ischemic changes of the white matter.  CT angiogram of head and neck with no large or medium vessel occlusion.  MRI did not identify any acute intracranial abnormality or significant interval change.  Patient  receive TPA.  Echocardiogram with ejection fraction of 65% without emboli.  EEG 06/17/2019 completed suggestive of cortical dysfunction in the left frontotemporal region nonspecific etiology no seizure activity seen. This EEG demonstrated improvement compared to 10/14 as frontotemporal slowing was now intermittent.--?post-ictal sate.  Placed  on Keppra for seizure prophylaxis.  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   Currently maintained on aspirin 81 mg daily and Plavix 75 mg daily x3 weeks then aspirin alone.  Patient is tolerating a regular consistency diet.  There have been intermittent bouts of agitation and restlessness noted.  Therapy evaluations completed and patient was admitted for a comprehensive rehab program.  Review of Systems  Constitutional: Negative for chills and fever.  HENT: Negative for hearing loss.   Eyes: Negative for blurred vision and double vision.  Respiratory: Negative for cough and shortness of breath.   Cardiovascular: Positive for palpitations. Negative for chest pain and leg swelling.  Gastrointestinal: Positive for constipation. Negative for heartburn, nausea and vomiting.  Genitourinary: Positive for urgency. Negative for dysuria, flank pain and hematuria.  Musculoskeletal: Positive for joint pain and myalgias.  Skin: Negative for rash.  Neurological: Positive for speech change.       Intermittent bouts of syncope and dizziness in the past  Psychiatric/Behavioral: Positive for memory loss. The patient has insomnia.   All other systems reviewed and are negative.  Past Medical History:  Diagnosis Date   Allergy    Arthritis    Cancer ([place])    Cataract    Chronic kidney disease    atrophic left kidney   Coronary atherosclerosis    a. 2012 - incidentally noted on CT scan of chest;  b. 08/2011 Myoview: normal; c. 03/2015 CPX @ [facility]: normal w/ PAC's, PVC's, and a 3 beat run of NSVT in recovery.   Heart murmur    asymptomatic function murmur.   History of  echocardiogram    a. 12/2004 Echo: Nl EF, trace TR/MR.   Hx of radiation therapy    Hyperlipidemia    Personal history of malignant neoplasm of prostate    a. 2008 s/p  prostatecomtomy-->radiation-->currently on Lupron injections.   Sinus bradycardia    Syncope    a. 12/2016   Past Surgical History:  Procedure Laterality Date   PROSTATECTOMY     ROBOT ASSISTED LAPAROSCOPIC RADICAL PROSTATECTOMY     SHOULDER SURGERY Right    SKIN GRAFT Left    TONSILLECTOMY     Family History  Problem Relation Age of Onset   Hypertension Mother    Thyroid disease Mother    Stroke Father    Coronary artery disease Father    Colon cancer Neg Hx    Esophageal cancer Neg Hx    Pancreatic cancer Neg Hx    Rectal cancer Neg Hx    Stomach cancer Neg Hx    Social History:  reports that he has never smoked. He has never used smokeless tobacco. He reports current alcohol use of about 1.0 standard drinks of alcohol per week. He reports that he does not use drugs. Allergies:  Allergies  Allergen Reactions   Atorvastatin Other (See Comments)    Caused confusion   Facility-Administered Medications Prior to Admission  Medication Dose Route Frequency Provider Last Rate Last Dose   0.9 %  sodium chloride infusion  500 mL Intravenous Continuous Irene Shipper, MD       Medications Prior to Admission  Medication Sig Dispense Refill   aspirin EC 81 MG tablet Take 81 mg by mouth daily.     ezetimibe (ZETIA) 10 MG tablet Take 10 mg by mouth daily.     leuprolide (LUPRON) 30 MG injection Inject 30 mg into the muscle See admin instructions. Inject 30 mg IM every 6 months     Misc Natural Products (OSTEO BI-FLEX ADV JOINT SHIELD PO) Take 1 tablet by mouth daily.      Multiple Vitamins-Minerals (CENTRUM SILVER PO) Take 1 tablet by mouth daily.     ibuprofen (ADVIL,MOTRIN) 400 MG tablet Take 1 tablet (400 mg total) by mouth every 4 (four) hours as needed for mild pain or moderate pain. (Patient not taking: Reported on 06/15/2019) 30 tablet 0    Drug Regimen Review Drug regimen was reviewed and remains appropriate with no significant issues  identified  Home: Home Living Family/patient expects to be discharged to:: Private residence Living Arrangements: Spouse/significant other Available Help at Discharge: Family Type of Home: House Additional Comments: Pt reports he lives with his wife  Lives With: Spouse   Functional History: Prior Function Level of Independence: Independent  Functional Status:  Mobility: Bed Mobility Overal bed mobility: Needs Assistance Bed Mobility: Supine to Sit Supine to sit: Min guard, +2 for safety/equipment General bed mobility comments: Close guard for lines/leads; Cues to initiate Transfers Overall transfer level: Needs assistance Equipment used: None Transfers: Sit to/from Stand Sit to Stand: Min guard General transfer comment: Very close guard for safety, lines/leads; Stood impulsively; No physical assist to power up; noted braced LEs against bed, indicative of incr fall risk Ambulation/Gait Ambulation/Gait assistance: Mod assist Gait Distance (Feet): 125 Feet Assistive device: 1 person hand held assist, Rolling walker (2 wheeled) Gait Pattern/deviations: Decreased stride length, Scissoring, Narrow base of support General Gait Details: Initially ambulating with no assistive device, pt demonstrating heavy scissoring and posterior lean, with several lateral LOB requiring assist by PT to correct. Rest  of ambulation completed with walker and moderate assist for stability. Pt with very narrow BOS, scissoring, decreased push off in terminal stance; does not correct significantly with cues. Requires cues for direction, sequencing    ADL: ADL Overall ADL's : Needs assistance/impaired Eating/Feeding: Set up, Supervision/ safety, Sitting Grooming: Set up, Supervision/safety, Sitting Grooming Details (indicate cue type and reason): Cues for sequencing Upper Body Bathing: Minimal assistance, Sitting Lower Body Bathing: Sit to/from stand, Moderate assistance Lower Body Bathing Details  (indicate cue type and reason): Mod A for standing balance Upper Body Dressing : Minimal assistance, Sitting Lower Body Dressing: Sit to/from stand, Moderate assistance Lower Body Dressing Details (indicate cue type and reason): Pt donning socks at EOB with Min A for sitting balance and Mod cues for sequencing. Mod A for standing balance Toilet Transfer: Moderate assistance, +2 for physical assistance, Stand-pivot(simulated to recliner) Toilet Transfer Details (indicate cue type and reason): Min A to power up and then Mod A for gaining balance in standing. Min Guard A for safety in pivot to recliner. +2 throughout Functional mobility during ADLs: Moderate assistance(stand pivot) General ADL Comments: Pt presenting with decreased balance, cognition, strength, and activity tolerance.   Cognition: Cognition Overall Cognitive Status: Impaired/Different from baseline Arousal/Alertness: Awake/alert Orientation Level: Oriented to person, Oriented to place Attention: Selective Selective Attention: Impaired Selective Attention Impairment: Verbal basic, Functional basic Memory: (TBA- impaired at baseline) Awareness: Impaired Awareness Impairment: Anticipatory impairment Problem Solving: Impaired Problem Solving Impairment: Verbal basic, Functional basic Executive Function: Initiating, Self Monitoring, Self Correcting Initiating: Impaired Initiating Impairment: Verbal basic Behaviors: Other (comment)(Became frustrated with performance ) Safety/Judgment: Impaired(Tried to get out of chair without assist) Cognition Arousal/Alertness: Awake/alert Behavior During Therapy: Impulsive Overall Cognitive Status: Impaired/Different from baseline Area of Impairment: Orientation, Attention, Following commands, Safety/judgement, Awareness, Problem solving, Memory Orientation Level: Disoriented to, Place, Time, Situation Current Attention Level: Sustained Memory: Decreased short-term memory Following  Commands: Follows one step commands with increased time(and occasional reminders) Safety/Judgement: Decreased awareness of safety, Decreased awareness of deficits Awareness: Intellectual Problem Solving: Difficulty sequencing, Requires verbal cues, Requires tactile cues, Slow processing General Comments: Pt A&Ox1, oriented to self, unable to correctly answer place or month when given options or context i.e. asked if month was January, March or October and pt stating March. Pt is impulsive and requires cues for safety  Physical Exam: Blood pressure 122/75, pulse (!) 47, temperature 98 F (36.7 C), temperature source Oral, resp. rate 16, height 5\' 9"  (1.753 m), weight 69.5 kg, SpO2 100 %. Physical Exam  Constitutional: He appears well-developed and well-nourished. No distress.  HENT:  Head: Normocephalic and atraumatic.  Eyes: Pupils are equal, round, and reactive to light. EOM are normal.  Neck: Normal range of motion. No tracheal deviation present. No thyromegaly present.  Cardiovascular: Normal rate and regular rhythm. Exam reveals no friction rub.  No murmur heard. Respiratory: Effort normal. No respiratory distress. He has no wheezes.  GI: Soft. He exhibits no distension. There is no abdominal tenderness.  Musculoskeletal: Normal range of motion.  Neurological: He displays normal reflexes. No cranial nerve deficit.  Pt is alert and oriented to month, year and day (with cues). Mild word finding deficits and motor planning delays. No PD but decreased Price of RUE and RLE. Strength grossly 4+/5 in all 4's.  No gross sensory deficits.   Skin: Skin is warm and dry.  Psychiatric:  Flat but generally pleasant and cooperative    Results for orders placed or performed during the hospital encounter of  06/15/19 (from the past 48 hour(s))  CBC     Status: None   Collection Time: 06/17/19  3:57 AM  Result Value Ref Range   WBC 7.4 4.0 - 10.5 K/uL   RBC 4.51 4.22 - 5.81 MIL/uL   Hemoglobin 14.1  13.0 - 17.0 g/dL   HCT 39.7 39.0 - 52.0 %   MCV 88.0 80.0 - 100.0 fL   MCH 31.3 26.0 - 34.0 pg   MCHC 35.5 30.0 - 36.0 g/dL   RDW 12.8 11.5 - 15.5 %   Platelets 215 150 - 400 K/uL   nRBC 0.0 0.0 - 0.2 %    Comment: Performed at [facility], [place] [address] [place], [place] [id number]  Basic metabolic panel     Status: Abnormal   Collection Time: 06/17/19  3:57 AM  Result Value Ref Range   Sodium 130 (L) 135 - 145 mmol/L   Potassium 3.8 3.5 - 5.1 mmol/L   Chloride 96 (L) 98 - 111 mmol/L   CO2 21 (L) 22 - 32 mmol/L   Glucose, Bld 104 (H) 70 - 99 mg/dL   BUN 11 8 - 23 mg/dL   Creatinine, Ser 0.78 0.61 - 1.24 mg/dL   Calcium 9.2 8.9 - 10.3 mg/dL   GFR calc non Af Amer >60 >60 mL/min   GFR calc Af Amer >60 >60 mL/min   Anion gap 13 5 - 15    Comment: Performed at [facility] [address], [place], [place] [address]   Mr Brain Wo Contrast  Addendum Date: 06/16/2019   ADDENDUM REPORT: 06/16/2019 21:02 ADDENDUM: A 4 mm subcortical infarct is present in the right left frontal operculum on image 36 of series 2. Findings were discussed with Dr. Leonie Man at 5:45 p.m. Electronically Signed   By: San Morelle M.D.   On: 06/16/2019 21:02   Result Date: 06/16/2019 CLINICAL DATA:  Stroke, follow-up. Status post tPA. Slurred speech. EXAM: MRI HEAD WITHOUT CONTRAST TECHNIQUE: Multiplanar, multiecho pulse sequences of the brain and surrounding structures were obtained without intravenous contrast. COMPARISON:  CT head and CTA head and neck head/13/20. MR head without and with contrast 01/31/2019. FINDINGS: Brain: Diffusion-weighted images demonstrate no acute or subacute infarction. Mild generalized atrophy and white matter disease is stable. No acute hemorrhage or mass lesion is present. The ventricles are of normal size. No significant extraaxial fluid collection is present. Dilated perivascular spaces are present in the basal ganglia. The brainstem and cerebellum are within normal  limits. Vascular: Flow is present in the major intracranial arteries. Skull and upper cervical spine: The craniocervical junction is normal. Upper cervical spine is within normal limits. Marrow signal is unremarkable. Sinuses/Orbits: The paranasal sinuses and mastoid air cells are clear. Left lens replacement is noted. Globes and orbits are otherwise within normal limits. IMPRESSION: 1. No acute intracranial abnormality or significant interval change. 2. Stable atrophy and white matter disease. Electronically Signed: By: San Morelle M.D. On: 06/16/2019 17:18       Medical Problem List and Plan: 1.  Gait dysfunction with speech difficulty secondary to complicated seizure with encephalopathy as well as incidental findings of small left middle cerebral artery branch abnormality likely embolic etiology, pt status post TPA.  -admit to inpatient rehab  -ELOS 7-10 days, supervision to mod I goals  -monitor for any further episodes of altered mental status, aphasia, etc 2.  Antithrombotics: -DVT/anticoagulation: SCDs.  -antiplatelet therapy: Aspirin 81 mg daily and Plavix 75 mg daily x3  weeks then aspirin alone 3. Pain Management: Tylenol as needed 4. Mood: Provide emotional support  -antipsychotic agents: N/A 5. Neuropsych: This patient is capable of making decisions on his own behalf. 6. Skin/Wound Care: Routine skin checks 7. Fluids/Electrolytes/Nutrition: Routine in and outs with follow-up chemistries 8.  Seizure prophylaxis.  Keppra XR 500 mg daily initiated 9.  Hyperlipidemia.  Zetia 10.  History of prostate cancer 2008 status post prostatectomy as well as radiation therapy.  Receives Lupron injection 30 mg every 6 months 11.OSA.  Patient had been on CPAP at home.  Will have wife bring in mask     Cathlyn Parsons, PA-C 06/18/2019

## 2019-06-18 NOTE — Progress Notes (Signed)
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 CT showing no acute changes. EEG suggestive of cortical dysfunction in left frontotemporal region. MRI with 4 mm punctuate left insular cortex infarct.    PT Comments    Pt making good progress with functional mobility within session and as compared to previous session. Focus of session was on gait training, standing balance and functional transfer training.  Plan is for pt to d/c to CIR today. Pt would continue to benefit from skilled physical therapy services at this time while admitted and after d/c to address the below listed limitations in order to improve overall safety and independence with functional mobility.   Follow Up Recommendations  CIR;Supervision/Assistance - 24 hour     Equipment Recommendations  Rolling walker with 5" wheels    Recommendations for Other Services       Precautions / Restrictions Precautions Precautions: Fall Restrictions Weight Bearing Restrictions: No    Mobility  Bed Mobility Overal bed mobility: Needs Assistance Bed Mobility: Supine to Sit     Supine to sit: Supervision        Transfers Overall transfer level: Needs assistance Equipment used: None;Rolling walker (2 wheeled) Transfers: Sit to/from Stand Sit to Stand: Min assist;Min guard         General transfer comment: initially requiring min A with posterior LOB, progressing to min guard  Ambulation/Gait Ambulation/Gait assistance: Mod assist;Min assist Gait Distance  (Feet): 200 Feet Assistive device: Rolling walker (2 wheeled) Gait Pattern/deviations: Decreased stride length;Scissoring;Narrow base of support Gait velocity: able to fluctuate   General Gait Details: pt ambulating in room and in hallway with RW, initially requiring mod A secondary to multiple LOB in all directions with significant scissoring (improved with cueing); progressing to min A    Stairs             Wheelchair Mobility    Modified Rankin (Stroke Patients Only) Modified Rankin (Stroke Patients Only) Pre-Morbid Rankin Score: No symptoms Modified Rankin: Moderately severe disability     Balance Overall balance assessment: Needs assistance Sitting-balance support: No upper extremity supported;Feet supported Sitting balance-Leahy Scale: Good     Standing balance support: Bilateral upper extremity supported;Single extremity supported Standing balance-Leahy Scale: Poor                              Cognition Arousal/Alertness: Awake/alert Behavior During Therapy: Impulsive Overall Cognitive Status: Impaired/Different from baseline Area of Impairment: Attention;Memory;Following commands;Safety/judgement;Awareness;Problem solving                   Current Attention Level: Sustained Memory: Decreased short-term memory Following Commands: Follows one step commands with increased time Safety/Judgement: Decreased awareness of safety;Decreased awareness of deficits Awareness: Intellectual Problem Solving: Difficulty sequencing;Requires verbal cues;Requires tactile cues;Slow processing        Exercises General Exercises - Lower Extremity Long Arc Quad: Strengthening;Both;10 reps;Other (comment)(with resistance provided by PT) Hip ABduction/ADduction: Standing;Strengthening;Both;10 reps Hip Flexion/Marching: Standing;AROM;Strengthening;Both;10 reps Other Exercises Other Exercises: pt performed sit<>stand from EOB with hands in lap x10  General Comments        Pertinent Vitals/Pain Pain Assessment: Faces Faces Pain Scale: No hurt    Home Living                      Prior Function            PT Goals (current goals can now be found in the care plan section) Acute Rehab PT Goals PT Goal Formulation: With patient/family Time For Goal Achievement: 07/01/19 Potential to Achieve Goals: Good Progress towards PT goals: Progressing toward goals    Frequency    Min 4X/week      PT Plan Current plan remains appropriate    Co-evaluation              AM-PAC PT "6 Clicks" Mobility   Outcome Measure  Help needed turning from your back to your side while in a flat bed without using bedrails?: A Little Help needed moving from lying on your back to sitting on the side of a flat bed without using bedrails?: A Little Help needed moving to and from a bed to a chair (including a wheelchair)?: A Little Help needed standing up from a chair using your arms (e.g., wheelchair or bedside chair)?: A Little Help needed to walk in hospital room?: A Lot Help needed climbing 3-5 steps with a railing? : A Lot 6 Click Score: 16    End of Session Equipment Utilized During Treatment: Gait belt Activity Tolerance: Patient tolerated treatment well Patient left: in chair;with call bell/phone within reach;with family/visitor present Nurse Communication: Mobility status PT Visit Diagnosis: Unsteadiness on feet (R26.81);Other abnormalities of gait and mobility (R26.89);Other symptoms and signs involving the nervous system (R29.898)     Time: [id number] PT Time Calculation (min) (ACUTE ONLY): 39 min  Charges:  $Gait Training: 8-22 mins $Therapeutic Exercise: 8-22 mins $Therapeutic Activity: 8-22 mins                     Sherie Don, PT, DPT  Acute Rehabilitation Services Pager [phone] Office [place] 06/18/2019, 4:43 PM

## 2019-06-18 NOTE — Care Management Important Message (Signed)
Important Message  Patient Details  Name: Spencer Zirkelbach, MD MRN: [id number] Date of Birth: 21-May-1941   Medicare Important Message Given:  Yes     Orbie Pyo 06/18/2019, 2:25 PM

## 2019-06-18 NOTE — Procedures (Signed)
Patient Name: Spencer Wandling, MD  MRN: [id number]  Epilepsy Attending: Lora Havens  Referring Physician/Provider: Ferne Coe, NP Duration:06/17/2019 [phone] 06/18/2019 1014  Patient history:78 year old male presented with sudden onset confusion and speech difficulty concerning for stroke. EEG to evaluate for seizures  Level of alertness:Awake, asleep  AEDs during EEG study: None   Technical aspects: This EEG study was done with scalp electrodes positioned according to the 10-20 International system of electrode placement. Electrical activity was acquired at a sampling rate of 500Hz  and reviewed with a high frequency filter of 70Hz  and a low frequency filter of 1Hz . EEG data were recorded continuously and digitally stored.  Description: The posterior dominant rhythm consists of 8 Hz activity of moderate voltage (25-35 uV) seen predominantly in posterior head regions, symmetric and reactive to eye opening and eye closing.  EEG also showed intermittent 2-5hz  generalized theta-delta slowing, maximal left frontotemporal region region. Hyperventilation and photic stimulation were not performed.  Of note there was significant artifact after 06/18/2019 100 AM which significantly limited the review of EEG.  Abnormality -Intermittent slow, generalized, maximum left frontotemporal  IMPRESSION: This study is issuggestive of cortical dysfunction in the left frontotemporal region. Additionally, there is evidence of mild diffuse encephalopathy. No seizures or epileptiform discharges were seen throughout the recording.  This EEG appears improved compared to prior study on 06/16/2019 as the continuous left frontotemporal slowing has reduced is now replaced by intermittent slowing. Even though no clear epileptiform discharges were seen, given clinical history its possible that patient had a seizure and the first EEG on 06/16/2019 showed slowing likely secondary to postictal state which has  since been improving.

## 2019-06-18 NOTE — Progress Notes (Signed)
Nurse called and gave report to [facility], RN. Patient transported to [facility] via wheelchair. Telemetry discontinued. Patient belongings packed by daughter and sent with patient to room. Nurse left patient in 57[address]18 with [place], RN at bedside.  Gwendolyn Grant, RN

## 2019-06-18 NOTE — Progress Notes (Signed)
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 Date: a 05/03/2010 b 03/03/2011     Deduct: $1408      Out of Pocket Max: none      Life Max: none CIR: 100%      SNF: 20 full days Outpatient: 80%     Co-Pay: 20% Home Health: 100%      Co-Pay: none DME: 805     Co-Pay: 20% Providers: pt choice  SECONDARY: BCBS supplement      Policy#: [id number]      Subscriber: pt  Medicaid Application Date:       Case Manager:  Disability Application Date:       Case Worker:   The "Data Collection Information Summary" for patients in Inpatient Rehabilitation Facilities with attached "Privacy Act [place] Records" was provided and verbally reviewed with: Patient and Family  Emergency Contact Information         Contact Information    Name Relation Home Work [place] Spouse [phone]  [phone]   Werner Lean Daughter [phone]        Current Medical History  Patient Admitting Diagnosis: encephalopathy, seizure disorder  History of Present Illness:  78 year old right-handed male with history of hyperlipidemia, prostate cancer 2008 with prostatectomy as well as radiation therapy and Lupron injections every 6  months, CAD maintained on low-dose aspirin and syncope. Presented 06/15/2019 with altered mental status and speech difficulty. Per report and discussion with wife patient has had progressive neurological issues for the last several months and has been seen outpatient neurology by Dr. Brett Fairy with several CT scans of the head showing no definite etiology. No history of documented seizure or seizure-like episodes. He had a PET scan done 04/06/2019 which did not suggest any diminished bilateral cortical metabolism to raise concern about Alzheimer's. Patient has had neuropsychological test battery recently which suggested posterior cortical atrophy variant. His last Mini-Mental status exam was 25 out of 30 on 06/08/2019 with his visit to neurology services Dr. Brett Fairy. Latest chemistries upon admission with sodium 130, COVID negative, hemoglobin 13.6, WBC 5.1. Cranial CT scan 06/15/2019 showed no acute findings by CT. Noted atrophy and chronic small vessel ischemic changes of the white matter. CT angiogram of head and neck with no large or medium vessel occlusion. MRI did not identify any acute intracranial abnormality or significant interval change. Patient receive TPA. Echocardiogram with ejection fraction of 65% without emboli. 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 Currently maintained on aspirin 81 mg daily and Plavix 75 mg daily x3 weeks then aspirin alone. Patient is tolerating a regular consistency diet. There have been intermittent bouts of agitation and restlessness noted.   Complete NIHSS TOTAL: 3  Patient's medical record from [facility] has been  reviewed by the rehabilitation admission coordinator and physician.  Past Medical History      Past Medical History:  Diagnosis Date  . Allergy   . Arthritis   . Cancer (HCC)   . Cataract   . Chronic kidney disease    atrophic left kidney  . Coronary atherosclerosis    a. 2012 - incidentally noted on CT scan of chest;  b. 08/2011 Myoview: normal; c. 03/2015 CPX @ [facility]: normal w/ PAC's, PVC's, and a 3 beat run of NSVT in recovery.  . Heart murmur    asymptomatic function murmur.  . History of echocardiogram    a. 12/2004 Echo: Nl EF, trace TR/MR.  . Hx of radiation therapy   . Hyperlipidemia   . Personal history of malignant neoplasm of prostate    a. 2008 s/p prostatecomtomy-->radiation-->currently on Lupron injections.  . Sinus bradycardia   . Syncope    a. 12/2016    Family History   family history includes Coronary artery disease in his father; Hypertension in his mother; Stroke in his father; Thyroid disease in his mother.  Prior Rehab/Hospitalizations Has the patient had prior rehab or hospitalizations prior to admission? Yes  Has the patient had major surgery during 100 days prior to admission? No              Current Medications  Current Facility-Administered Medications:  .  0.9 %  sodium chloride infusion, , Intravenous, Continuous, Sethi, Pramod S, MD, Last Rate: 75 mL/hr at 06/17/19 0400 .  acetaminophen (TYLENOL) tablet 650 mg, 650 mg, Oral, Q4H PRN **OR** acetaminophen (TYLENOL) solution 650 mg, 650 mg, Per Tube, Q4H PRN **OR** acetaminophen (TYLENOL) suppository 650 mg, 650 mg, Rectal, Q4H PRN, Sethi, Pramod S, MD .  aspirin EC tablet 81 mg, 81 mg, Oral, Daily, Biby, Sharon L, NP, 81 mg at 06/18/19 1009 .  Chlorhexidine Gluconate Cloth 2 % PADS 6 each, 6 each, Topical, Daily, Aroor, Sushanth R, MD, 6 each at 06/15/19 1842 .  clopidogrel (PLAVIX) tablet 75 mg, 75 mg, Oral, Daily, Biby, Sharon L, NP, 75 mg at 06/18/19 1009 .  levETIRAcetam  (KEPPRA XR) 24 hr tablet 500 mg, 500 mg, Oral, Daily, Sethi, Pramod S, MD, 500 mg at 06/18/19 1206 .  pantoprazole (PROTONIX) EC tablet 40 mg, 40 mg, Oral, QHS, Sethi, Pramod S, MD, 40 mg at 06/17/19 2208 .  senna-docusate (Senokot-S) tablet 1 tablet, 1 tablet, Oral, QHS PRN, Sethi, Pramod S, MD  Patients Current Diet:     Diet Order                  Diet regular Room service appropriate? Yes; Fluid consistency: Thin  Diet effective now               Precautions / Restrictions Precautions Precautions: Fall Restrictions Weight Bearing Restrictions: No   Has the patient had 2 or more falls or a fall with injury in the past year? No  Prior Activity Level Limited Community (1-2x/wk): (very active, independent; gradual decline since March)  Prior Functional Level Self Care: Did the patient need   help bathing, dressing, using the toilet or eating? Independent  Indoor Mobility: Did the patient need assistance with walking from room to room (with or without device)? Independent  Stairs: Did the patient need assistance with internal or external stairs (with or without device)? Independent  Functional Cognition: Did the patient need help planning regular tasks such as shopping or remembering to take medications? Needed some help  Home Assistive Devices / Equipment  Prior Device Use: Indicate devices/aids used by the patient prior to current illness, exacerbation or injury? None of the above  Current Functional Level Cognition  Arousal/Alertness: Awake/alert Overall Cognitive Status: Impaired/Different from baseline Current Attention Level: Sustained Orientation Level: Oriented X4 Following Commands: Follows one step commands with increased time(and occasional reminders) Safety/Judgement: Decreased awareness of safety, Decreased awareness of deficits General Comments: Pt A&Ox1, oriented to self, unable to correctly answer place or month when given options or  context i.e. asked if month was January, March or October and pt stating March. Pt is impulsive and requires cues for safety Attention: Selective Selective Attention: Impaired Selective Attention Impairment: Verbal basic, Functional basic Memory: (TBA- impaired at baseline) Awareness: Impaired Awareness Impairment: Anticipatory impairment Problem Solving: Impaired Problem Solving Impairment: Verbal basic, Functional basic Executive Function: Initiating, Self Monitoring, Self Correcting Initiating: Impaired Initiating Impairment: Verbal basic Behaviors: Other (comment)(Became frustrated with performance ) Safety/Judgment: Impaired(Tried to get out of chair without assist)    Extremity Assessment (includes Sensation/Coordination)  Upper Extremity Assessment: RUE deficits/detail RUE Deficits / Details: Noting decreased grasp strength adn gross motor strength compared to LUE. Pt right handed. Noting decreased fine motor coorindation. Unable to perform finger opposition due to cognitive and coordination deficits RUE Coordination: decreased fine motor, decreased gross motor  Lower Extremity Assessment: Defer to PT evaluation    ADLs  Overall ADL's : Needs assistance/impaired Eating/Feeding: Set up, Supervision/ safety, Sitting Grooming: Set up, Supervision/safety, Sitting Grooming Details (indicate cue type and reason): Cues for sequencing Upper Body Bathing: Minimal assistance, Sitting Lower Body Bathing: Sit to/from stand, Moderate assistance Lower Body Bathing Details (indicate cue type and reason): Mod A for standing balance Upper Body Dressing : Minimal assistance, Sitting Lower Body Dressing: Sit to/from stand, Moderate assistance Lower Body Dressing Details (indicate cue type and reason): Pt donning socks at EOB with Min A for sitting balance and Mod cues for sequencing. Mod A for standing balance Toilet Transfer: Moderate assistance, +2 for physical assistance,  Stand-pivot(simulated to recliner) Toilet Transfer Details (indicate cue type and reason): Min A to power up and then Mod A for gaining balance in standing. Min Guard A for safety in pivot to recliner. +2 throughout Functional mobility during ADLs: Moderate assistance(stand pivot) General ADL Comments: Pt presenting with decreased balance, cognition, strength, and activity tolerance.     Mobility  Overal bed mobility: Needs Assistance Bed Mobility: Supine to Sit Supine to sit: Min guard, +2 for safety/equipment General bed mobility comments: Close guard for lines/leads; Cues to initiate    Transfers  Overall transfer level: Needs assistance Equipment used: None Transfers: Sit to/from Stand Sit to Stand: Min guard General transfer comment: Very close guard for safety, lines/leads; Stood impulsively; No physical assist to power up; noted braced LEs against bed, indicative of incr fall risk    Ambulation / Gait / Stairs / Wheelchair Mobility  Ambulation/Gait Ambulation/Gait assistance: Mod assist Gait Distance (Feet): 125 Feet Assistive device: 1 person hand held assist, Rolling walker (2 wheeled) Gait Pattern/deviations: Decreased stride length, Scissoring, Narrow base of support General Gait  Details: Initially ambulating with no assistive device, pt demonstrating heavy scissoring and posterior lean, with several lateral LOB requiring assist by PT to correct. Rest of ambulation completed with walker and moderate assist for stability. Pt with very narrow BOS, scissoring, decreased push off in terminal stance; does not correct significantly with cues. Requires cues for direction, sequencing    Posture / Balance Balance Overall balance assessment: Needs assistance Sitting-balance support: No upper extremity supported, Feet supported Sitting balance-Leahy Scale: Fair Standing balance support: Bilateral upper extremity supported Standing balance-Leahy Scale: Poor Standing balance  comment: Reliant on physical A    Special needs/care consideration BiPAP/CPAP yes, began 02/2019 CPM  N/a Continuous Drip IV  N/a Dialysis n/a Life Vest  N/a Oxygen  N/a Special Bed  N/a Trach Size  N/a Wound Vac n/a Skin  intact Bowel mgmt:  No LBM documented Bladder mgmt: external catheter Diabetic mgmt: n/a Behavioral consideration n/a Chemo/radiation history for prostate cancer Designated visitor is Spencer Underwood, wife   Previous Home Environment  Living Arrangements: Spouse/significant other  Lives With: Spouse Available Help at Discharge: Family, Available 24 hours/day Type of Home: House Home Layout: Two level, Bed/bath upstairs Alternate Level Stairs-Rails: Right, Left(rail on right going up an don left going down) Alternate Level Stairs-Number of Steps: flight Home Access: Stairs to enter Entrance Stairs-Rails: None Entrance Stairs-Number of Steps: 1 step from garage [employer] Shower/Tub: Multimedia programmer: Standard Bathroom Accessibility: Yes How Accessible: Accessible via walker Home Care Services: No Additional Comments: lives with his wife  Discharge Living Setting Plans for Discharge Living Setting: Patient's home, Lives with (comment)(spouse) Type of Home at Discharge: House Discharge Home Layout: Two level, Bed/bath upstairs Alternate Level Stairs-Rails: Right, Left Alternate Level Stairs-Number of Steps: flight Discharge Home Access: Stairs to enter Entrance Stairs-Rails: None Entrance Stairs-Number of Steps: 1 step through garage Discharge Bathroom Shower/Tub: Horticulturist, commercial: Standard Discharge Bathroom Accessibility: Yes How Accessible: Accessible via walker Does the patient have any problems obtaining your medications?: No  Social/Family/Support Systems Patient Roles: Spouse, Parent(retired Dealer) Contact Information: wife, Spencer Underwood Anticipated Caregiver: wife Anticipated Ambulance person Information: see  above Ability/Limitations of Caregiver: no limitations Caregiver Availability: 24/7 Discharge Plan Discussed with Primary Caregiver: Yes Is Caregiver In Agreement with Plan?: Yes Does Caregiver/Family have Issues with Lodging/Transportation while Pt is in Rehab?: No  Goals/Additional Needs Patient/Family Goal for Rehab: Mod I to supervision PT, suerpvison OT and SLP Expected length of stay: ELOS 7 to 10 days Pt/Family Agrees to Admission and willing to participate: Yes Program Orientation Provided & Reviewed with Pt/Caregiver Including Roles  & Responsibilities: Yes  Decrease burden of Care through IP rehab admission: n/a  Possible need for SNF placement upon discharge: n/a  Patient Condition: I have reviewed medical records from [facility] , spoken with CSW, and patient and spouse. I met with patient at the bedside for inpatient rehabilitation assessment.  Patient will benefit from ongoing PT, OT and SLP, can actively participate in 3 hours of therapy a day 5 days of the week, and can make measurable gains during the admission.  Patient will also benefit from the coordinated team approach during an Inpatient Acute Rehabilitation admission.  The patient will receive intensive therapy as well as Rehabilitation physician, nursing, social worker, and care management interventions.  Due to bladder management, bowel management, safety, skin/wound care, disease management, medication administration, pain management and patient education the patient requires 24 hour a day rehabilitation nursing.  The patient is currently min to  mod assist with mobility and basic ADLs.  Discharge setting and therapy post discharge at home with outpatient is anticipated.  Patient has agreed to participate in the Acute Inpatient Rehabilitation Program and will admit today.  Preadmission Screen Completed By:  Ruchy Wildrick Godwin, 06/18/2019 1:21 PM  ______________________________________________________________________   Discussed status with Dr. Swartz  on  06/18/2019 at 2:10 pm and received approval for admission today.  Admission Coordinator:  Audery Wassenaar Godwin, RN, time 2:10pm/Date 06/18/2019   Assessment/Plan: Diagnosis: complicated seizures with encephalopathy 1. Does the need for close, 24 hr/day Medical supervision in concert with the patient's rehab needs make it unreasonable for this patient to be served in a less intensive setting? Yes 2. Co-Morbidities requiring supervision/potential complications: prostate cancer, ckd 3. Due to bladder management, bowel management, safety, skin/wound care, disease management, medication administration, pain management and patient education, does the patient require 24 hr/day rehab nursing? Yes 4. Does the patient require coordinated care of a physician, rehab nurse, PT, OT, and SLP to address physical and functional deficits in the context of the above medical diagnosis(es)? Yes Addressing deficits in the following areas: balance, endurance, locomotion, strength, transferring, bowel/bladder control, bathing, dressing, feeding, grooming, toileting, cognition, language and psychosocial support 5. Can the patient actively participate in an intensive therapy program of at least 3 hrs of therapy 5 days a week? Yes 6. The potential for patient to make measurable gains while on inpatient rehab is excellent 7. Anticipated functional outcomes upon discharge from inpatient rehab: modified independent and supervision PT, modified independent and supervision OT, modified independent and supervision SLP 8. Estimated rehab length of stay to reach the above functional goals is: 7-10 days 9. Anticipated discharge destination: Home 10. Overall Rehab/Functional Prognosis: excellent   MD Signature: Zachary T. Swartz, MD, FAAPMR [facility] 06/18/2019          Revision History                      

## 2019-06-19 ENCOUNTER — Inpatient Hospital Stay (HOSPITAL_COMMUNITY): Payer: Medicare Other | Admitting: Speech Pathology

## 2019-06-19 ENCOUNTER — Inpatient Hospital Stay (HOSPITAL_COMMUNITY): Payer: Medicare Other | Admitting: Physical Therapy

## 2019-06-19 ENCOUNTER — Inpatient Hospital Stay (HOSPITAL_COMMUNITY): Payer: Medicare Other | Admitting: Occupational Therapy

## 2019-06-19 DIAGNOSIS — R0989 Other specified symptoms and signs involving the circulatory and respiratory systems: Secondary | ICD-10-CM

## 2019-06-19 DIAGNOSIS — R7303 Prediabetes: Secondary | ICD-10-CM

## 2019-06-19 DIAGNOSIS — E871 Hypo-osmolality and hyponatremia: Secondary | ICD-10-CM

## 2019-06-19 DIAGNOSIS — R001 Bradycardia, unspecified: Secondary | ICD-10-CM

## 2019-06-19 DIAGNOSIS — R569 Unspecified convulsions: Secondary | ICD-10-CM

## 2019-06-19 MED ORDER — TRAZODONE HCL 50 MG PO TABS
25.0000 mg | ORAL_TABLET | Freq: Once | ORAL | Status: AC
  Administered 2019-06-19: 25 mg via ORAL
  Filled 2019-06-19: qty 1

## 2019-06-19 NOTE — Evaluation (Signed)
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 Hyponatremia 06/18/2019  . Agitation 06/18/2019  . Encephalopathy 06/18/2019  . 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 Sleep apnea, central 03/26/2019  . Transient ischemic attack (TIA) 01/26/2019  . Transient diplopia 12/20/2018  . Dysesthesia affecting both sides of body 12/20/2018  . 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 Snoring 12/20/2018  . Nocturia more than twice per night 12/20/2018  . Insomnia disorder, with other sleep disorder, recurrent 12/20/2018  . Arthritis   . Cancer ([place])   . Elevated troponin   . Syncope 12/26/2016  . Family history of early CAD 12/26/2016  . Hyperlipidemia 08/15/2011  . PERSONAL HISTORY MALIGNANT NEOPLASM PROSTATE 07/11/2010    Past Medical History:  Past Medical History:  Diagnosis Date  . Allergy   . Arthritis   . Cancer ([place])   . Cataract   . Chronic kidney disease    atrophic left kidney  . Coronary atherosclerosis    a. 2012 - incidentally noted on CT scan of chest;  b. 08/2011 Myoview: normal; c. 03/2015 CPX @ [facility]: normal w/ PAC's, PVC's, and a 3 beat run of NSVT in recovery.  Marland Kitchen Heart murmur    asymptomatic function murmur.  Marland Kitchen History of echocardiogram    a. 12/2004 Echo: Nl EF, trace TR/MR.  Marland Kitchen Hx of radiation therapy   . Hyperlipidemia   . Personal history of malignant neoplasm of prostate     a. 2008 s/p prostatecomtomy-->radiation-->currently on Lupron injections.  . Sinus bradycardia   . Syncope    a. 12/2016   Past Surgical History:  Past Surgical History:  Procedure Laterality Date  . PROSTATECTOMY    . ROBOT ASSISTED LAPAROSCOPIC RADICAL PROSTATECTOMY    . SHOULDER SURGERY Right   . SKIN GRAFT Left   . TONSILLECTOMY      Assessment & Plan Clinical Impression: Patient is a 78 y.o. year old male with history of hyperlipidemia, prostate cancer 2008 with prostatectomy as well as radiation therapy and Lupron injections every 6 months, CAD maintained on low-dose aspirin and syncope. Per chart review patient lives with spouse. 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 Per report and discussion with wife patient has had progressive neurological issues for the last several months and has been seen outpatient neurology by Dr. Brett Fairy with several CT scans of the head showing no definite etiology. No history of documented seizure or seizure-like episodes. He had a PET scan done 04/06/2019 which did not suggest any diminished bilateral cortical metabolism to raise concern about Alzheimer's. Patient has had neuropsychological test battery recently which suggested posterior cortical atrophy variant. His last Mini-Mental status exam was 25 out of 30 on 06/08/2019 with his visit to neurology services Dr. Brett Fairy. Latest chemistries upon admission with sodium 130, COVID negative, hemoglobin 13.6, WBC 5.1. Cranial CT scan 06/15/2019 showed no acute findings by CT. Noted atrophy and chronic small vessel  ischemic changes of the white matter. CT angiogram of head and neck with no large or medium vessel occlusion. MRI did not identify any acute intracranial abnormality or significant interval change. Patient receive TPA. Echocardiogram with ejection fraction of 65% without emboli. EEG 06/17/2019  completed suggestive of cortical dysfunction in the left frontotemporal region nonspecific etiology no seizure activity seen. This EEG demonstrated improvement compared to 10/14 as frontotemporal slowing was now intermittent.--?post-ictal sate.Placed on Keppra for seizure prophylaxis.Neurology follow-up review of MRI shows incidental findings ofsmall left middle cerebral artery branch infarct likely of embolic etiologysuspect complicated seizure with encephalopathy. Currently maintained on aspirin 81 mg daily and Plavix 75 mg daily x3 weeks then aspirin alone. Patient is tolerating a regular consistency diet. There have been intermittent bouts of agitation and restlessness noted   Patient transferred to CIR on 06/18/2019 .    Patient currently requires min with basic self-care skills secondary to muscle weakness, decreased cardiorespiratoy endurance, decreased attention, decreased awareness, decreased problem solving, decreased safety awareness, decreased memory and delayed processing and decreased standing balance and decreased balance strategies.  Prior to hospitalization, patient could complete ADLs and IADLs with independent .  Patient will benefit from skilled intervention to decrease level of assist with basic self-care skills prior to discharge home with care partner.  Anticipate patient will require 24 hour supervision and follow up outpatient.  OT - End of Session Activity Tolerance: Decreased this session Endurance Deficit: Yes Endurance Deficit Description: multiple rest breaks secondary to fatigue OT Assessment Rehab Potential (ACUTE ONLY): Good OT Barriers to Discharge: Other (comments) OT Barriers to Discharge Comments: none known at this time OT Patient demonstrates impairments in the following area(s): Balance;Behavior;Cognition;Endurance;Motor;Safety OT Basic ADL's Functional Problem(s): Grooming;Bathing;Dressing;Toileting OT Advanced ADL's Functional Problem(s): Simple  Meal Preparation OT Transfers Functional Problem(s): Toilet;Tub/Shower OT Additional Impairment(s): None OT Plan OT Intensity: Minimum of 1-2 x/day, 45 to 90 minutes OT Frequency: 5 out of 7 days OT Duration/Estimated Length of Stay: 7 days OT Treatment/Interventions: Balance/vestibular training;DME/adaptive equipment instruction;Patient/family education;Therapeutic Activities;Cognitive remediation/compensation;Psychosocial support;Therapeutic Exercise;Community reintegration;Functional mobility training;Self Care/advanced ADL retraining;UE/LE Strength taining/ROM;Discharge planning;UE/LE Coordination activities;Neuromuscular re-education OT Self Feeding Anticipated Outcome(s): n/a OT Basic Self-Care Anticipated Outcome(s): S OT Toileting Anticipated Outcome(s): S OT Bathroom Transfers Anticipated Outcome(s): S OT Recommendation Recommendations for Other Services: Neuropsych consult Patient destination: Home Follow Up Recommendations: Outpatient OT;24 hour supervision/assistance Equipment Recommended: To be determined   Skilled Therapeutic Intervention Upon entering the room, pt seated in wheelchair with wife present in room. Pt with no c/o pain this session and agreeable to OT intervention. Pt very impulsive throughout session and requiring mod verbal questioning cuing for memory. Pt ambulating with hand held assistance into bathroom for toileting needs. Pt standing at toilet to void with min A for standing balance. Pt seated on TTB in shower with min cuing for safety awareness to remain seated throughout. Pt donning clothing items with sit <>stand from EOB and min A for balance. OT educated pt and caregiver on OT purpose, POC, and goals with them verbalizing understanding and agreement. Pt transferred to sit in recliner chair with chair alarm activated and call bell within reach.    OT Evaluation Precautions/Restrictions  Precautions Precautions: Fall  Pain Pain Assessment Pain Scale:  0-10 Pain Score: 0-No pain Home Living/Prior Functioning Home Living Available Help at Discharge: Family, Available 24 hours/day Type of Home: House Home Access: Stairs to enter [employer] of Steps: 1 step from garage Entrance Stairs-Rails: None Home Layout: Two level, Bed/bath upstairs Alternate Level  Stairs-Number of Steps: flight Alternate Level Stairs-Rails: Right, Left Bathroom Shower/Tub: Multimedia programmer: Standard Bathroom Accessibility: Yes Additional Comments: lives with his wife  Lives With: Spouse IADL History Education: Medical School Prior Function Level of Independence: Independent with basic ADLs, Independent with homemaking with ambulation, Independent with gait, Independent with transfers 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 Leisure: Hobbies-yes (Comment) Comments: book club, golf, exercise Vision Baseline Vision/History: Wears glasses Wears Glasses: At all times Patient Visual Report: No change from baseline Vision Assessment?: No apparent visual deficits Cognition Overall Cognitive Status: Impaired/Different from baseline Arousal/Alertness: Awake/alert Orientation Level: Place;Situation;Person Person: Oriented Place: Oriented Situation: Oriented Year: 2020 Month: October Day of Week: Correct Memory: Impaired Memory Impairment: Decreased recall of new information Immediate Memory Recall: Sock;Blue;Bed Memory Recall Sock: Not able to recall Memory Recall Blue: With Cue Memory Recall Bed: Not able to recall Selective Attention: Appears intact Awareness: Impaired Awareness Impairment: Emergent impairment Problem Solving: Impaired Problem Solving Impairment: Verbal basic;Functional basic Safety/Judgment: Impaired Sensation Sensation Light Touch: Appears Intact Hot/Cold: Appears Intact Stereognosis: Not tested Coordination Gross Motor Movements are Fluid and Coordinated: Yes Fine Motor  Movements are Fluid and Coordinated: Yes Motor  Motor Motor: Motor apraxia Motor - Skilled Clinical Observations: poor safety awareness and sequencing Mobility  Bed Mobility Bed Mobility: Rolling Right;Rolling Left;Supine to Sit;Sit to Supine Rolling Right: Supervision/verbal cueing Rolling Left: Supervision/Verbal cueing Supine to Sit: Supervision/Verbal cueing Sit to Supine: Supervision/Verbal cueing Transfers Sit to Stand: Contact Guard/Touching assist Stand to Sit: Contact Guard/Touching assist  Trunk/Postural Assessment  Cervical Assessment Cervical Assessment: Within Functional Limits Thoracic Assessment Thoracic Assessment: Within Functional Limits Lumbar Assessment Lumbar Assessment: Within Functional Limits Postural Control Postural Control: Deficits on evaluation  Balance Balance Balance Assessed: Yes Standardized Balance Assessment Standardized Balance Assessment: Berg Balance Test Berg Balance Test Sit to Stand: Able to stand without using hands and stabilize independently Standing Unsupported: Able to stand 2 minutes with supervision Sitting with Back Unsupported but Feet Supported on Floor or Stool: Able to sit safely and securely 2 minutes Stand to Sit: Controls descent by using hands Transfers: Able to transfer safely, definite need of hands Standing Unsupported with Eyes Closed: Able to stand 10 seconds with supervision Standing Ubsupported with Feet Together: Able to place feet together independently and stand for 1 minute with supervision From Standing, Reach Forward with Outstretched Arm: Can reach forward >12 cm safely (5") From Standing Position, Pick up Object from Floor: Able to pick up shoe, needs supervision From Standing Position, Turn to Look Behind Over each Shoulder: Looks behind one side only/other side shows less weight shift Turn 360 Degrees: Able to turn 360 degrees safely but slowly Standing Unsupported, Alternately Place Feet on Step/Stool:  Able to complete 4 steps without aid or supervision Standing Unsupported, One Foot in Front: Able to plae foot ahead of the other independently and hold 30 seconds Standing on One Leg: Able to lift leg independently and hold equal to or more than 3 seconds Total Score: 41 Dynamic Sitting Balance Dynamic Sitting - Level of Assistance: 5: Stand by assistance Dynamic Standing Balance Dynamic Standing - Level of Assistance: 5: Stand by assistance Extremity/Trunk Assessment RUE Assessment RUE Assessment: Within Functional Limits LUE Assessment LUE Assessment: Within Functional Limits     Refer to Care Plan for Long Term Goals  Recommendations for other services: Neuropsych   Discharge Criteria: Patient will be discharged from OT if patient refuses treatment 3 consecutive times without medical reason, if treatment goals  not met, if there is a change in medical status, if patient makes no progress towards goals or if patient is discharged from hospital.  The above assessment, treatment plan, treatment alternatives and goals were discussed and mutually agreed upon: by patient and by family  Gypsy Decant 06/19/2019, 12:33 PM

## 2019-06-19 NOTE — Evaluation (Signed)
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 Prediabetes   . Bradycardia   . Labile blood pressure   . Seizures ([place])   . Hyponatremia 06/18/2019  . Agitation 06/18/2019  . Encephalopathy 06/18/2019  . 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 Sleep apnea, central 03/26/2019  . Transient ischemic attack (TIA) 01/26/2019  . Transient diplopia 12/20/2018  . Dysesthesia affecting both sides of body 12/20/2018  . 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 Snoring 12/20/2018  . Nocturia more than twice per night 12/20/2018  . Insomnia disorder, with other sleep disorder, recurrent 12/20/2018  . Arthritis   . Cancer ([place])   . Elevated troponin   . Syncope 12/26/2016  . Family history of early CAD 12/26/2016  . Hyperlipidemia 08/15/2011  . PERSONAL HISTORY MALIGNANT NEOPLASM PROSTATE 07/11/2010   Past Medical History:  Past Medical History:  Diagnosis Date  . Allergy   . Arthritis   . Cancer ([place])   . Cataract   . Chronic kidney disease    atrophic left kidney  . Coronary atherosclerosis    a. 2012 - incidentally noted on CT scan of chest;  b. 08/2011 Myoview: normal; c. 03/2015 CPX @ [facility]: normal w/ PAC's, PVC's, and a 3 beat run of NSVT in recovery.  Marland Kitchen Heart murmur    asymptomatic function murmur.  Marland Kitchen History of echocardiogram    a. 12/2004 Echo: Nl EF, trace TR/MR.  Marland Kitchen Hx of radiation therapy   . Hyperlipidemia   . Personal history of  malignant neoplasm of prostate    a. 2008 s/p prostatecomtomy-->radiation-->currently on Lupron injections.  . Sinus bradycardia   . Syncope    a. 12/2016   Past Surgical History:  Past Surgical History:  Procedure Laterality Date  . PROSTATECTOMY    . ROBOT ASSISTED LAPAROSCOPIC RADICAL PROSTATECTOMY    . SHOULDER SURGERY Right   . SKIN GRAFT Left   . TONSILLECTOMY      Assessment / Plan / Recommendation Clinical Impression Patient  is a 78 year old right-handed male with history of hyperlipidemia, prostate cancer 2008 with prostatectomy as well as radiation therapy and Lupron injections every 6 months, CAD maintained on low-dose aspirin and syncope. Per chart review patient lives with spouse. 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 Per report and discussion with wife patient has had progressive neurological issues for the last several months and has been seen outpatient neurology by Dr. Brett Fairy with several CT scans of the head showing no definite etiology. No history of documented seizure or seizure-like episodes. He had a PET scan done 04/06/2019 which did not suggest any diminished bilateral cortical metabolism to raise concern about Alzheimer's. Cranial CT scan 06/15/2019 showed no acute findings. Noted atrophy and chronic small vessel ischemic changes of the white matter. MRI did not identify any acute intracranial abnormality or significant interval change. EEG 06/17/2019 completed suggestive of cortical dysfunction in the left frontotemporal region nonspecific etiology no seizure activity seen.  This EEG demonstrated improvement compared to 10/14 as frontotemporal slowing was now intermittent, question post-ictal sate.Placed on Keppra for seizure prophylaxis.Neurology follow-up review of MRI shows incidental findings ofsmall left middle cerebral artery branch infarct likely of embolic  etiologysuspect complicated seizure with encephalopathy. Patient is tolerating a regular consistency diet. There have been intermittent bouts of agitation and restlessness noted. Therapy evaluations completed and patient was admitted for a comprehensive rehab program 06/18/19.  Patient demonstrates moderate cognitive impairments impacting functional problem solving, safety awareness due to impulsivity, short-term recall and error awareness. Patient's auditory comprehension was intact for both basic and complex information and only mild, higher-level word-finding deficits were noted at the conversation level requiring extra time and intermittent verbal cues. However, suspect greater impairments in language may be present while attempting to dual task or when patient is lethargic. Continued diagnostic treatment is needed for reading comprehension and written expression and suspect patient may benefit from a standardized cognitive assessment. Patient would benefit from skilled SLP intervention to maximize his cognitive-linguistic function and overall functional independence prior to discharge.    Skilled Therapeutic Interventions          Administered a cognitive-linguistic evaluation, please see above for details. Spent time educating the patient and his wife in regards to the difference between cognitive deficits and language deficits and how both areas can impact overall function. Also educated on how this SLP feels patient's biggest areas of deficits are in cognition rather than language and gave examples to support rationale. Both verbalized understanding.    SLP Assessment  Patient will need skilled [place] [facility] during CIR admission    Recommendations  Oral Care Recommendations: Oral care BID Recommendations for Other Services: Neuropsych consult Patient destination: Home Follow up Recommendations: 24 hour supervision/assistance;Outpatient SLP Equipment Recommended: None  recommended by SLP    SLP Frequency 3 to 5 out of 7 days   SLP Duration  SLP Intensity  SLP Treatment/Interventions 7-10 days  Minumum of 1-2 x/day, 30 to 90 minutes  Cognitive remediation/compensation;Internal/external aids;Speech/Language facilitation;Therapeutic Activities;Environmental controls;Cueing hierarchy;Functional tasks;Patient/family education    Pain Pain Assessment Pain Scale: 0-10 Pain Score: 0-No pain  Prior Functioning Type of Home: House  Lives With: Spouse Available Help at Discharge: Family;Available 24 hours/day Education: Medical School Vocation: Retired  Industrial/product designer Term Goals: Week 1: SLP Short Term Goal 1 (Week 1): STGs=LTGs due to ELOS  Refer to Care Plan for Long Term Goals  Recommendations for other services: Neuropsych  Discharge Criteria: Patient will be discharged from SLP if patient refuses treatment 3 consecutive times without medical reason, if treatment goals not met, if there is a change in medical status, if patient makes no progress towards goals or if patient is discharged from hospital.  The above assessment, treatment plan, treatment alternatives and goals were discussed and mutually agreed upon: by patient and by family  Ashaki Frosch 06/19/2019, 2:23 PM

## 2019-06-19 NOTE — Evaluation (Signed)
Physical Therapy Assessment and Plan  Patient Details  Name: Spencer Araki, Spencer Underwood MRN: [id number] Date of Birth: 12-19-1940  PT Diagnosis: Abnormality of gait, Difficulty walking and Impaired cognition Rehab Potential: Good ELOS: 7-10 days   Today's Date: 06/19/2019 PT Individual Time: 1300-1400 PT Individual Time Calculation (min): 60 min    Problem List:  Patient Active Problem List   Diagnosis Date Noted  . Prediabetes   . Bradycardia   . Labile blood pressure   . Seizures ([place])   . Hyponatremia 06/18/2019  . Agitation 06/18/2019  . Encephalopathy 06/18/2019  . 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 Sleep apnea, central 03/26/2019  . Transient ischemic attack (TIA) 01/26/2019  . Transient diplopia 12/20/2018  . Dysesthesia affecting both sides of body 12/20/2018  . 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 Snoring 12/20/2018  . Nocturia more than twice per night 12/20/2018  . Insomnia disorder, with other sleep disorder, recurrent 12/20/2018  . Arthritis   . Cancer ([place])   . Elevated troponin   . Syncope 12/26/2016  . Family history of early CAD 12/26/2016  . Hyperlipidemia 08/15/2011  . PERSONAL HISTORY MALIGNANT NEOPLASM PROSTATE 07/11/2010    Past Medical History:  Past Medical History:  Diagnosis Date  . Allergy   . Arthritis   . Cancer ([place])   . Cataract   . Chronic kidney disease    atrophic left kidney  . Coronary atherosclerosis    a. 2012 - incidentally noted on CT scan of chest;  b. 08/2011 Myoview: normal; c. 03/2015 CPX @ [facility]: normal w/ PAC's, PVC's, and a 3 beat run of NSVT in recovery.  Marland Kitchen Heart murmur    asymptomatic function murmur.  Marland Kitchen History of echocardiogram    a. 12/2004 Echo: Nl EF, trace TR/MR.  Marland Kitchen Hx of radiation therapy   . Hyperlipidemia   . Personal history of  malignant neoplasm of prostate    a. 2008 s/p prostatecomtomy-->radiation-->currently on Lupron injections.  . Sinus bradycardia   . Syncope    a. 12/2016   Past Surgical History:  Past Surgical History:  Procedure Laterality Date  . PROSTATECTOMY    . ROBOT ASSISTED LAPAROSCOPIC RADICAL PROSTATECTOMY    . SHOULDER SURGERY Right   . SKIN GRAFT Left   . TONSILLECTOMY      Assessment & Plan Clinical Impression: Abdulhadi Stopa. Lanni is a 78 year old right-handed male with history of hyperlipidemia, prostate cancer 2008 with prostatectomy as well as radiation therapy and Lupron injections every 6 months, CAD maintained on low-dose aspirin and syncope. Per chart review patient lives with spouse. 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 Per report and discussion with wife patient has had progressive neurological issues for the last several months and has been seen outpatient neurology by Dr. Brett Fairy with several CT scans of the head showing no definite etiology. No history of documented seizure or seizure-like episodes. He had a PET scan done 04/06/2019 which did not suggest any diminished bilateral cortical metabolism to raise concern about Alzheimer's. Patient has had neuropsychological test battery recently which suggested posterior cortical atrophy variant. His last Mini-Mental status exam was 25 out of 30 on 06/08/2019 with his visit to neurology services Dr. Brett Fairy. Latest chemistries upon admission with sodium 130, COVID negative, hemoglobin 13.6, WBC 5.1. Cranial CT scan  06/15/2019 showed no acute findings by CT. Noted atrophy and chronic small vessel ischemic changes of the white matter. CT angiogram of head and neck with no large or medium vessel occlusion. MRI did not identify any acute intracranial abnormality or significant interval change. Patient receive TPA. Echocardiogram with ejection  fraction of 65% without emboli. EEG 06/17/2019 completed suggestive of cortical dysfunction in the left frontotemporal region nonspecific etiology no seizure activity seen. This EEG demonstrated improvement compared to 10/14 as frontotemporal slowing was now intermittent.--?post-ictal sate.Placed on Keppra for seizure prophylaxis.Neurology follow-up review of MRI shows incidental findings ofsmall left middle cerebral artery branch infarct likely of embolic etiologysuspect complicated seizure with encephalopathy. Currently maintained on aspirin 81 mg daily and Plavix 75 mg daily x3 weeks then aspirin alone. Patient is tolerating a regular consistency diet. There have been intermittent bouts of agitation and restlessness noted. Therapy evaluations completed and patient was admitted for a comprehensive rehab program on 06/18/2019 .   Patient currently requires supervision bed mobility, CGA-minA for ambulation, CGA for transfers, CGA for wheelchair mobility, minA for stairs with mobility secondary to impaired timing and sequencing, decreased coordination and decreased motor planning, decreased motor planning, decreased initiation, decreased attention, decreased awareness, decreased problem solving, decreased safety awareness, decreased memory and delayed processing and decreased standing balance, decreased postural control, decreased balance strategies and difficulty maintaining precautions.  Prior to hospitalization, patient was independent  with mobility and lived with Spouse in a House home.  Home access is 1 step from garageStairs to enter.  Patient will benefit from skilled PT intervention to maximize safe functional mobility, minimize fall risk and decrease caregiver burden for planned discharge home with intermittent assist.  Anticipate patient will benefit from follow up OP at discharge.  PT - End of Session Activity Tolerance: Tolerates 10 - 20 min activity with multiple rests PT  Assessment Rehab Potential (ACUTE/IP ONLY): Good PT Barriers to Discharge: [place] home environment;Home environment access/layout;Decreased caregiver support PT Patient demonstrates impairments in the following area(s): Balance;Perception;Endurance;Safety;Sensory;Motor PT Transfers Functional Problem(s): Bed Mobility;Bed to Chair;Car;Furniture;Floor PT Locomotion Functional Problem(s): Ambulation;Wheelchair Mobility;Stairs PT Plan PT Intensity: Minimum of 1-2 x/day ,45 to 90 minutes PT Frequency: 5 out of 7 days PT Duration Estimated Length of Stay: 7-10 days PT Treatment/Interventions: Ambulation/gait training;Cognitive remediation/compensation;Discharge planning;DME/adaptive equipment instruction;Functional mobility training;Pain management;Psychosocial support;Splinting/orthotics;Therapeutic Activities;UE/LE Strength taining/ROM;Visual/perceptual remediation/compensation;Wheelchair propulsion/positioning;UE/LE Coordination activities;Therapeutic Exercise;Stair training;Skin care/wound management;Patient/family education;Neuromuscular re-education;Functional electrical stimulation;Disease management/prevention;Community reintegration;Balance/vestibular training PT Transfers Anticipated Outcome(s): supervision PT Locomotion Anticipated Outcome(s): supervision PT Recommendation Follow Up Recommendations: Outpatient PT Patient destination: Home Equipment Recommended: To be determined  Skilled Therapeutic Intervention SPT performed evaluation and implemented treatment intervention. See below for details. Pt and pt's wife educated on PT goals while here in rehab in addition to ELOS and future recommendation of OPPT.   Pt upright in recliner upon arrival for session. Pt agreeable to evaluation and treatment. Pt completed Berg Balance Test and scored 41/56 on evaluation. Pt performed 2 trials of 55mt:0.7m/s and 0.76 m/s. Below 0.8 m/s indicates decreased community ambulation. Pt ambulated 150  feet with CGA back to room. Pt completed car transferx2 with CGA-minA. Pt left sitting upright in recliner with call bell within reach and chair alarm on. All needs met at this time.    PT Evaluation Precautions/Restrictions Precautions Precautions: Fall Restrictions Weight Bearing Restrictions: No Pain Pain Assessment Pain Scale: 0-10 Pain Score: 0-No pain Home Living/Prior Functioning Home Living Available Help at Discharge: Family;Available 24 hours/day Type of Home: House Home Access: Stairs to enter  Entrance Stairs-Number of Steps: 1 step from garage Entrance Stairs-Rails: None Home Layout: Two level;Bed/bath upstairs Alternate Level Stairs-Number of Steps: flight Alternate Level Stairs-Rails: Right;Left Bathroom Shower/Tub: Multimedia programmer: Standard Bathroom Accessibility: Yes Additional Comments: lives with his wife  Lives With: Spouse Prior Function Level of Independence: Independent with basic ADLs;Independent with homemaking with ambulation;Independent with gait;Independent with transfers 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 Leisure: Hobbies-yes (Comment) Comments: book club, golf, exercise Vision/Perception  Wears glasses, all the time, no changes in vision     Cognition Overall Cognitive Status: Impaired/Different from baseline Arousal/Alertness: Awake/alert Orientation Level: Oriented to person;Oriented to situation;Disoriented to place;Disoriented to time Selective Attention: Appears intact Memory: Impaired Memory Impairment: Decreased recall of new information Awareness: Impaired Awareness Impairment: Emergent impairment Problem Solving: Impaired Problem Solving Impairment: Verbal basic;Functional basic Safety/Judgment: Impaired Sensation Sensation Light Touch: Appears Intact Coordination Gross Motor Movements are Fluid and Coordinated: Yes Fine Motor Movements are Fluid and Coordinated: Yes Motor   Motor Motor: Motor apraxia Motor - Skilled Clinical Observations: poor safety awareness and sequencing  Mobility Bed Mobility Bed Mobility: Rolling Right;Rolling Left;Supine to Sit;Sit to Supine Rolling Right: Supervision/verbal cueing Rolling Left: Supervision/Verbal cueing Supine to Sit: Supervision/Verbal cueing Sit to Supine: Supervision/Verbal cueing Transfers Transfers: Sit to Stand;Stand Pivot Transfers;Stand to Sit Sit to Stand: Supervision/Verbal cueing Stand to Sit: Supervision/Verbal cueing Stand Pivot Transfers: Supervision/Verbal cueing Stand Pivot Transfer Details: Verbal cues for precautions/safety;Verbal cues for technique Stand Pivot Transfer Details (indicate cue type and reason): supervision - verbal cueing Locomotion  Gait Ambulation: Yes Gait Assistance: Contact Guard/Touching assist;Minimal Assistance - Patient > 75% Gait Distance (Feet): 150 Feet Gait Assistance Details: Verbal cues for technique;Verbal cues for gait pattern;Verbal cues for precautions/safety Gait Assistance Details: mostly CGA, few instances of LOB requiring minA Gait Gait: Yes Gait Pattern: Impaired Gait velocity: increased, needs to decrease for safety at time of evaluation Stairs / Additional Locomotion Stairs: Yes Stairs Assistance: Minimal Assistance - Patient > 75% Stair Management Technique: Two rails Number of Stairs: 12 Height of Stairs: 4 Wheelchair Mobility Wheelchair Mobility: Yes Wheelchair Assistance: Development worker, international aid: Both upper extremities Wheelchair Parts Management: Needs assistance Distance: 125 feet  Trunk/Postural Assessment  Cervical Assessment Cervical Assessment: Within Functional Limits Thoracic Assessment Thoracic Assessment: Within Functional Limits Lumbar Assessment Lumbar Assessment: Within Functional Limits Postural Control Postural Control: Deficits on evaluation  Balance Balance Balance Assessed:  Yes Standardized Balance Assessment Standardized Balance Assessment: Berg Balance Test Berg Balance Test Sit to Stand: Able to stand without using hands and stabilize independently Standing Unsupported: Able to stand 2 minutes with supervision Sitting with Back Unsupported but Feet Supported on Floor or Stool: Able to sit safely and securely 2 minutes Stand to Sit: Controls descent by using hands Transfers: Able to transfer safely, definite need of hands Standing Unsupported with Eyes Closed: Able to stand 10 seconds with supervision Standing Ubsupported with Feet Together: Able to place feet together independently and stand for 1 minute with supervision From Standing, Reach Forward with Outstretched Arm: Can reach forward >12 cm safely (5") From Standing Position, Pick up Object from Floor: Able to pick up shoe, needs supervision From Standing Position, Turn to Look Behind Over each Shoulder: Looks behind one side only/other side shows less weight shift Turn 360 Degrees: Able to turn 360 degrees safely but slowly Standing Unsupported, Alternately Place Feet on Step/Stool: Able to complete 4 steps without aid or supervision Standing Unsupported, One Foot in Front: Able to plae  foot ahead of the other independently and hold 30 seconds Standing on One Leg: Able to lift leg independently and hold equal to or more than 3 seconds Total Score: 41 Extremity Assessment      RLE Assessment General Strength Comments: grossly WNL LLE Assessment General Strength Comments: grossly WNL  Refer to Care Plan for Long Term Goals  Recommendations for other services: None   Discharge Criteria: Patient will be discharged from PT if patient refuses treatment 3 consecutive times without medical reason, if treatment goals not met, if there is a change in medical status, if patient makes no progress towards goals or if patient is discharged from hospital.  The above assessment, treatment plan, treatment  alternatives and goals were discussed and mutually agreed upon: by patient and by family  Olena Leatherwood, SPT  06/19/2019, 3:19 PM

## 2019-06-19 NOTE — Progress Notes (Signed)
[facility] PHYSICAL MEDICINE & REHABILITATION PROGRESS NOTE  Subjective/Complaints: Patient seen sitting up in his chair this morning.  He states he slept well overnight.  He is slow to process and reactive.  He states he is ready begin therapies.  ROS: Denies CP, SOB, N/V/D  Objective: Vital Signs: Blood pressure (!) 150/77, pulse (!) 50, temperature 97.8 F (36.6 C), temperature source Oral, resp. rate 20, height 5\' 9"  (1.753 m), weight 70.8 kg, SpO2 100 %. No results found. Recent Labs    06/17/19 0357  WBC 7.4  HGB 14.1  HCT 39.7  PLT 215   Recent Labs    06/17/19 0357  NA 130*  K 3.8  CL 96*  CO2 21*  GLUCOSE 104*  BUN 11  CREATININE 0.78  CALCIUM 9.2    Physical Exam: BP (!) 150/77 (BP Location: Right Arm)   Pulse (!) 50   Temp 97.8 F (36.6 C) (Oral)   Resp 20   Ht 5\' 9"  (1.753 m)   Wt 70.8 kg   SpO2 100%   BMI 23.04 kg/m  Constitutional: No distress . Vital signs reviewed. HENT: Normocephalic.  Atraumatic. Eyes: EOMI. No discharge. Cardiovascular: No JVD. Respiratory: Normal effort.  No stridor. GI: Non-distended. Skin: Warm and dry.  Intact. Psych: Flat. Musc: No edema in extremities.  No tenderness in extremities. Neurological: Alert Word finding difficulties Delayed processing Motor: Grossly 4+/5 throughout  Assessment/Plan: 1. Functional deficits secondary to encephalopathy which require 3+ hours per day of interdisciplinary therapy in a comprehensive inpatient rehab setting.  Physiatrist is providing close team supervision and 24 hour management of active medical problems listed below.  Physiatrist and rehab team continue to assess barriers to discharge/monitor patient progress toward functional and medical goals  Care Tool:  Bathing              Bathing assist       Upper Body Dressing/Undressing Upper body dressing        Upper body assist      Lower Body Dressing/Undressing Lower body dressing            Lower  body assist       Toileting Toileting    Toileting assist Assist for toileting: Contact Guard/Touching assist     Transfers Chair/bed transfer  Transfers assist     Chair/bed transfer assist level: Contact Guard/Touching assist     Locomotion Ambulation   Ambulation assist              Walk 10 feet activity   Assist           Walk 50 feet activity   Assist           Walk 150 feet activity   Assist           Walk 10 feet on uneven surface  activity   Assist           Wheelchair     Assist               Wheelchair 50 feet with 2 turns activity    Assist            Wheelchair 150 feet activity     Assist            Medical Problem List and Plan: 1.Gait dysfunction with speech difficultysecondary tocomplicated seizure with encephalopathy as well as incidental findings of 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  Begin CIR evaluations  2. Antithrombotics: -DVT/anticoagulation:SCDs. -antiplatelet therapy: Aspirin 81 mg daily and Plavix 75 mg daily x3 weeks then aspirin alone 3. Pain Management:Tylenol as needed 4. Mood:Provide emotional support -antipsychotic agents: N/A 5. Neuropsych: This patientisnot fully capable of making decisions on hisown behalf. 6. Skin/Wound Care:Routine skin checks 7. Fluids/Electrolytes/Nutrition:Routine in and outs 8. Seizure: Keppra XR500 mg daily initiated  No seizures since admission 9.Hyperlipidemia. [place] 10. History of prostate cancer 2008 status post prostatectomy as well as radiation therapy. Receives Lupron injection 30 mg every 6 months 11.OSA.Patient had been on CPAP at home.  Wife to bring in mask 12.  Labile blood pressure  Monitor for trend with increased mobility 13.  Bradycardia  Asymptomatic at present, continue to monitor with increased mobility 14.   Prediabetes  Monitor with increased mobility 15.  Hyponatremia  Sodium 130 on 10/15  Labs ordered for Monday  LOS: 1 days A FACE TO FACE EVALUATION WAS PERFORMED  Ankit Lorie Phenix 06/19/2019, 1:52 PM

## 2019-06-19 NOTE — Progress Notes (Signed)
Pt placed on Cpap on preveious settings

## 2019-06-19 NOTE — IPOC Note (Signed)
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 DOB: [birth year]  Admitting Diagnosis: Encephalopathy  Hospital Problems: Principal Problem:   Encephalopathy Active Problems:   Prediabetes   Bradycardia   Labile blood pressure   Seizures ([place])     Functional Problem List: Nursing Behavior, Bowel, Endurance, Motor, Safety, Perception  PT Balance, Perception, Endurance, Safety, Sensory, Motor  OT Balance, Behavior, Cognition, Endurance, Motor, Safety  SLP    TR         Basic ADL's: OT Grooming, Bathing, Dressing, Toileting     Advanced  ADL's: OT Simple Meal Preparation     Transfers: PT Bed Mobility, Bed to Chair, Car, Furniture, Floor  OT Toilet, Metallurgist: PT Ambulation, Emergency planning/management officer, Stairs     Additional Impairments: OT None  SLP Social Cognition, Communication expression Memory, Awareness, Problem Solving  TR      Anticipated Outcomes Item Anticipated Outcome  Self Feeding n/a  Swallowing      Basic self-care  S  Toileting  S   Bathroom Transfers S  Bowel/Bladder  continent of bowel and bladder with mod I assist  Transfers  supervision  Locomotion  supervision  Communication  Supervision  Cognition  Min A  Pain  pain level less that 3 on scale of 0-10  Safety/Judgment  remain free of injury, prevent falls with cues and reminders   Therapy Plan: PT Intensity: Minimum of 1-2 x/day ,45 to 90 minutes PT Frequency: 5 out of 7 days PT Duration Estimated Length of Stay: 7-10 days OT Intensity: Minimum of 1-2 x/day, 45 to 90 minutes OT Frequency: 5 out of 7 days OT Duration/Estimated Length of Stay: 7 days SLP Intensity: Minumum of 1-2 x/day, 30 to 90 minutes SLP Frequency: 3 to 5 out of 7 days SLP Duration/Estimated Length of Stay: 7-10 days    Team Interventions: Nursing Interventions Patient/Family Education, Bladder Management, Medication Management, Bowel  Management, Skin Care/Wound Management, Cognitive Remediation/Compensation, Psychosocial Support  PT interventions Ambulation/gait training, Cognitive remediation/compensation, Discharge planning, DME/adaptive equipment instruction, Functional mobility training, Pain management, Psychosocial support, Splinting/orthotics, Therapeutic Activities, UE/LE Strength taining/ROM, Visual/perceptual remediation/compensation, Wheelchair propulsion/positioning, UE/LE Coordination activities, Therapeutic Exercise, Stair training, Skin care/wound management, Patient/family education, Neuromuscular re-education, Functional electrical stimulation, Disease management/prevention, Academic librarian, Training and development officer  OT Interventions Training and development officer, Engineer, drilling, Patient/family education, Therapeutic Activities, Cognitive remediation/compensation, Psychosocial support, Therapeutic Exercise, Community reintegration, Functional mobility training, Self Care/advanced ADL retraining, UE/LE Strength taining/ROM, Discharge planning, UE/LE Coordination activities, Neuromuscular re-education  SLP Interventions Cognitive remediation/compensation, Internal/external aids, Speech/Language facilitation, Therapeutic Activities, Environmental controls, Cueing hierarchy, Functional tasks, Patient/family education  TR Interventions    SW/CM Interventions     Barriers to Discharge MD  Medical stability and Behavior  Nursing      PT Inaccessible home environment, Home environment access/layout, Decreased caregiver support    OT Other (comments) none known at this time  SLP      SW       Team Discharge Planning: Destination: PT-Home ,OT- Home , SLP-Home Projected Follow-up: PT-Outpatient PT, OT-  Outpatient OT, 24 hour supervision/assistance, SLP-24 hour supervision/assistance, Outpatient SLP Projected Equipment Needs: PT-To be determined, OT- To be determined, SLP-None recommended  by SLP Equipment Details: PT- , OT-  Patient/family involved in discharge planning: PT- Patient, Family member/caregiver,  OT-Patient, Family member/caregiver, SLP-Patient, Family member/caregiver  MD ELOS: 7-12 days. Medical Rehab Prognosis:  Good Assessment: 78 year old right-handed male with history of hyperlipidemia, prostate cancer 2008  with prostatectomy as well as radiation therapy and Lupron injections every 6 months, CAD maintained on low-dose aspirin and syncope. Presented 06/15/2019 with altered mental status and speech difficulty. Per report and discussion with wife patient has had progressive neurological issues for the last several months and has been seen outpatient neurology by Dr. Brett Fairy with several CT scans of the head showing no definite etiology. No history of documented seizure or seizure-like episodes. He had a PET scan done 04/06/2019 which did not suggest any diminished bilateral cortical metabolism to raise concern about Alzheimer's. Patient has had neuropsychological test battery recently which suggested posterior cortical atrophy variant. His last Mini-Mental status exam was 25 out of 30 on 06/08/2019 with his visit to neurology services Dr. Brett Fairy. Latest chemistries upon admission with sodium 130, COVID negative, hemoglobin 13.6, WBC 5.1. Cranial CT scan 06/15/2019 showed no acute findings by CT. Noted atrophy and chronic small vessel ischemic changes of the white matter. CT angiogram of head and neck with no large or medium vessel occlusion. MRI did not identify any acute intracranial abnormality or significant interval change. Patient receive TPA. Echocardiogram with ejection fraction of 65% without emboli. EEG 06/17/2019 completed suggestive of cortical dysfunction in the left frontotemporal region nonspecific etiology no seizure activity seen. 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 Currently maintained on aspirin 81 mg daily and Plavix 75 mg daily x3 weeks then aspirin alone. Patient is tolerating a regular consistency diet. There have been intermittent bouts of agitation and restlessness noted. Patient with resulting functional deficits with mobility, transfers, endurance, self-care, cognition.  Will set goals for Supervision with PT/OT/SLP.  Due to the current state of emergency, patients may not be receiving their 3-hours of Medicare-mandated therapy.  See Team Conference Notes for weekly updates to the plan of care

## 2019-06-20 ENCOUNTER — Inpatient Hospital Stay (HOSPITAL_COMMUNITY): Payer: Medicare Other | Admitting: Speech Pathology

## 2019-06-20 DIAGNOSIS — G479 Sleep disorder, unspecified: Secondary | ICD-10-CM

## 2019-06-20 MED ORDER — TRAZODONE HCL 50 MG PO TABS
25.0000 mg | ORAL_TABLET | Freq: Once | ORAL | Status: AC
  Administered 2019-06-20: 25 mg via ORAL
  Filled 2019-06-20: qty 1

## 2019-06-20 MED ORDER — QUETIAPINE FUMARATE 25 MG PO TABS
12.5000 mg | ORAL_TABLET | Freq: Every day | ORAL | Status: DC
  Administered 2019-06-20 – 2019-06-21 (×2): 12.5 mg via ORAL
  Filled 2019-06-20 (×2): qty 1

## 2019-06-20 NOTE — Progress Notes (Signed)
Pt refusing cpap for the night. RT will continue to monitor as needed. 

## 2019-06-20 NOTE — Progress Notes (Signed)
[facility] PHYSICAL MEDICINE & REHABILITATION PROGRESS NOTE  Subjective/Complaints: Patient seen laying in bed this morning.  He did not sleep well overnight per nursing.  Called by nursing over night regarding sleep disturbance, limited efficacy with trazodone.  Per nursing also some confusion and impulsive behaviors noted, telesitter ordered.  Discussed with nursing and reviewed nursing notes.  ROS: Denies CP, SOB, N/V/D  Objective: Vital Signs: Blood pressure (!) 153/83, pulse (!) 50, temperature (!) 97.5 F (36.4 C), temperature source Axillary, resp. rate 20, height 5\' 9"  (1.753 m), weight 70.8 kg, SpO2 100 %. No results found. No results for input(s): WBC, HGB, HCT, PLT in the last 72 hours. No results for input(s): NA, K, CL, CO2, GLUCOSE, BUN, CREATININE, CALCIUM in the last 72 hours.  Physical Exam: BP (!) 153/83 (BP Location: Right Arm)   Pulse (!) 50   Temp (!) 97.5 F (36.4 C) (Axillary)   Resp 20   Ht 5\' 9"  (1.753 m)   Wt 70.8 kg   SpO2 100%   BMI 23.04 kg/m  Constitutional: No distress . Vital signs reviewed. HENT: Normocephalic.  Atraumatic. Eyes: EOMI. No discharge. Cardiovascular: No JVD. Respiratory: Normal effort.  No stridor. GI: Non-distended. Skin: Warm and dry.  Intact. Psych: Flat. Musc: No edema in extremities.  No tenderness in extremities. Neurological: Alert and oriented x1 Word finding difficulties however improving Delayed processing,?  Improving Motor: Grossly 4+/5 throughout, unchanged  Assessment/Plan: 1. Functional deficits secondary to encephalopathy which require 3+ hours per day of interdisciplinary therapy in a comprehensive inpatient rehab setting.  Physiatrist is providing close team supervision and 24 hour management of active medical problems listed below.  Physiatrist and rehab team continue to assess barriers to discharge/monitor patient progress toward functional and medical goals  Care Tool:  Bathing    Body parts  bathed by patient: Right arm, Left arm, Chest, Abdomen, Front perineal area, Buttocks, Right upper leg, Left upper leg, Right lower leg, Left lower leg, Face         Bathing assist Assist Level: Contact Guard/Touching assist     Upper Body Dressing/Undressing Upper body dressing   What is the patient wearing?: Pull over shirt    Upper body assist Assist Level: Set up assist    Lower Body Dressing/Undressing Lower body dressing      What is the patient wearing?: Pants     Lower body assist Assist for lower body dressing: Moderate Assistance - Patient 50 - 74%     Toileting Toileting    Toileting assist Assist for toileting: Contact Guard/Touching assist     Transfers Chair/bed transfer  Transfers assist     Chair/bed transfer assist level: Contact Guard/Touching assist     Locomotion Ambulation   Ambulation assist      Assist level: Contact Guard/Touching assist Assistive device: No Device Max distance: 150 feet   Walk 10 feet activity   Assist     Assist level: Contact Guard/Touching assist     Walk 50 feet activity   Assist    Assist level: Contact Guard/Touching assist      Walk 150 feet activity   Assist    Assist level: Contact Guard/Touching assist      Walk 10 feet on uneven surface  activity   Assist Walk 10 feet on uneven surfaces activity did not occur: Safety/medical concerns         Wheelchair     Assist Will patient use wheelchair at discharge?: No Type of Wheelchair:  Manual    Wheelchair assist level: Contact Guard/Touching assist Max wheelchair distance: 125    Wheelchair 50 feet with 2 turns activity    Assist        Assist Level: Contact Guard/Touching assist   Wheelchair 150 feet activity     Assist Wheelchair 150 feet activity did not occur: Safety/medical concerns          Medical Problem List and Plan: 1.Gait dysfunction with speech difficultysecondary tocomplicated  seizure with encephalopathy as well as incidental findings of 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  Continue CIR 2. Antithrombotics: -DVT/anticoagulation:SCDs. -antiplatelet therapy: Aspirin 81 mg daily and Plavix 75 mg daily x3 weeks then aspirin alone 3. Pain Management:Tylenol as needed 4. Mood:Provide emotional support   Seroquel 12.5 nightly started on 10/18 -antipsychotic agents: N/A 5. Neuropsych: This patientisnot fully capable of making decisions on hisown behalf.  Telesitter for safety 6. Skin/Wound Care:Routine skin checks 7. Fluids/Electrolytes/Nutrition:Routine in and outs 8. Seizure: Keppra XR500 mg daily initiated  No seizures from admission to 10/18 9.Hyperlipidemia. [place] 10. History of prostate cancer 2008 status post prostatectomy as well as radiation therapy. Receives Lupron injection 30 mg every 6 months 11.OSA.Patient had been on CPAP at home.   12.  Labile blood pressure  Remains labile on 10/18  Monitor for trend with increased mobility 13.  Bradycardia  Persistent, but asymptomatic on 10/18   Continue to monitor with increased mobility 14.  Prediabetes  Monitor with increased mobility 15.  Hyponatremia  Sodium 130 on 10/15  Labs ordered for tomorrow 16.  Sleep disturbance  Trazodone ineffective  See #4  LOS: 2 days A FACE TO FACE EVALUATION WAS PERFORMED   Lorie Phenix 06/20/2019, 11:14 AM

## 2019-06-20 NOTE — Significant Event (Signed)
Patient confused with time and situation; impulsive. Pt reminded to use call bell for any assistance needed. In spite reminders, pt still got out of bed without calling. Patient brought to nurses station for safety. Dr. Posey Pronto informed of patient's confusion and impulsiveness with order. Pt was given trazadone. In the meantime, while patient was at nurses station, wife called and talked to patient.  Pt brought back to room and set the bed alarm on. He slept for about an hour then removed his cpap and got out of bed. Another dose of trazadone given. Telesitter ordered for additional safety precaution.

## 2019-06-20 NOTE — Progress Notes (Signed)
Speech Language Pathology Daily Session Note  Patient Details  Name: Spencer Baune, MD MRN: [id number] Date of Birth: 1941/07/08  Today's Date: 06/20/2019 SLP Individual Time: 1435-1515 SLP Individual Time Calculation (min): 40 min  Short Term Goals: Week 1: SLP Short Term Goal 1 (Week 1): STGs=LTGs due to ELOS  Skilled Therapeutic Interventions:  Pt was seen for skilled ST targeting ongoing diagnostic treatment of cognitive functioning.  SLP administered portions of the Cognistat with results indicating moderate impairment in the areas of registration/storage of information, delayed recall, and repetition of phrases and sentences.  Areas of borderline impairment included construction (per SLP's report from yesterday's evaluation) and attention.  Pt endorses significant changes in memory and periods of increased confusion.  Several times during today's session, pt appeared to become disoriented to place; although when questioned directly he was able to state that he was at [facility].  He was disoriented to situation and at times demonstrated language of confusion.  SLP initiated skilled education regarding memory compensatory strategies and provided pt and his wife with a handout to maximize carryover in between therapy sessions.  SLP also provided pt with a calendar in his room to facilitate more consistent orientation and minimize periods of confusion.  All questions were answered to pt's and wife's satisfaction at this time.  Continue per current plan of care.    Pain Pain Assessment Pain Scale: 0-10 Pain Score: 0-No pain  Therapy/Group: Individual Therapy  Giovanni Bath, Selinda Orion 06/20/2019, 4:17 PM

## 2019-06-21 ENCOUNTER — Inpatient Hospital Stay (HOSPITAL_COMMUNITY): Payer: Medicare Other | Admitting: Speech Pathology

## 2019-06-21 ENCOUNTER — Inpatient Hospital Stay (HOSPITAL_COMMUNITY): Payer: Medicare Other | Admitting: Occupational Therapy

## 2019-06-21 ENCOUNTER — Inpatient Hospital Stay (HOSPITAL_COMMUNITY): Payer: Medicare Other | Admitting: Physical Therapy

## 2019-06-21 DIAGNOSIS — G479 Sleep disorder, unspecified: Secondary | ICD-10-CM

## 2019-06-21 DIAGNOSIS — R569 Unspecified convulsions: Secondary | ICD-10-CM

## 2019-06-21 LAB — COMPREHENSIVE METABOLIC PANEL
ALT: 15 U/L (ref 0–44)
AST: 20 U/L (ref 15–41)
Albumin: 3.8 g/dL (ref 3.5–5.0)
Alkaline Phosphatase: 46 U/L (ref 38–126)
Anion gap: 11 (ref 5–15)
BUN: 12 mg/dL (ref 8–23)
CO2: 23 mmol/L (ref 22–32)
Calcium: 9.5 mg/dL (ref 8.9–10.3)
Chloride: 100 mmol/L (ref 98–111)
Creatinine, Ser: 1.05 mg/dL (ref 0.61–1.24)
GFR calc Af Amer: >60 mL/min (ref >60)
GFR calc non Af Amer: >60 mL/min (ref >60)
Glucose, Bld: 94 mg/dL (ref 70–99)
Potassium: 4.4 mmol/L (ref 3.5–5.1)
Sodium: 134 mmol/L — ABNORMAL LOW (ref 135–145)
Total Bilirubin: 0.7 mg/dL (ref 0.3–1.2)
Total Protein: 6.5 g/dL (ref 6.5–8.1)

## 2019-06-21 LAB — CBC WITH DIFFERENTIAL/PLATELET
Abs Immature Granulocytes: 0 10*3/uL (ref 0.00–0.07)
Basophils Absolute: 0.1 10*3/uL (ref 0.0–0.1)
Basophils Relative: 1 %
Eosinophils Absolute: 0.3 10*3/uL (ref 0.0–0.5)
Eosinophils Relative: 5 %
HCT: 44.3 % (ref 39.0–52.0)
Hemoglobin: 15 g/dL (ref 13.0–17.0)
Immature Granulocytes: 0 %
Lymphocytes Relative: 25 %
Lymphs Abs: 1.3 10*3/uL (ref 0.7–4.0)
MCH: 30.5 pg (ref 26.0–34.0)
MCHC: 33.9 g/dL (ref 30.0–36.0)
MCV: 90.2 fL (ref 80.0–100.0)
Monocytes Absolute: 0.5 10*3/uL (ref 0.1–1.0)
Monocytes Relative: 9 %
Neutro Abs: 3.2 10*3/uL (ref 1.7–7.7)
Neutrophils Relative %: 60 %
Platelets: 266 10*3/uL (ref 150–400)
RBC: 4.91 MIL/uL (ref 4.22–5.81)
RDW: 12.9 % (ref 11.5–15.5)
WBC: 5.3 10*3/uL (ref 4.0–10.5)
nRBC: 0 % (ref 0.0–0.2)

## 2019-06-21 NOTE — Plan of Care (Signed)
  Problem: Consults Goal: RH STROKE PATIENT EDUCATION Description: See Patient Education module for education specifics  Outcome: Progressing Goal: Nutrition Consult-if indicated Outcome: Progressing   Problem: RH BOWEL ELIMINATION Goal: RH STG MANAGE BOWEL WITH ASSISTANCE Description: STG Manage Bowel with mod I Assistance. Outcome: Progressing   Problem: RH BLADDER ELIMINATION Goal: RH STG MANAGE BLADDER WITH ASSISTANCE Description: STG Manage Bladder With mod I Assistance Outcome: Progressing Goal: RH STG MANAGE BLADDER WITH EQUIPMENT WITH ASSISTANCE Description: STG Manage Bladder With Equipment With Mod I Assistance Outcome: Progressing   Problem: RH SAFETY Goal: RH STG ADHERE TO SAFETY PRECAUTIONS W/ASSISTANCE/DEVICE Description: STG Adhere to Safety Precautions With verbal cues and reminders Assistance/Device. Outcome: Progressing Goal: RH STG DECREASED RISK OF FALL WITH ASSISTANCE Description: STG Decreased Risk of Fall With verbal cues and reminders Assistance. Outcome: Progressing   Problem: RH KNOWLEDGE DEFICIT Goal: RH STG INCREASE KNOWLEGDE OF HYPERLIPIDEMIA Description:  Pt and wife will be able demonstrate understanding of dietary modifications to control cholesterol levels independently.  Outcome: Progressing Goal: RH STG INCREASE KNOWLEDGE OF STROKE PROPHYLAXIS Description:  Pt and wife will be able demonstrate understanding of dietary modifications to control blood pressure independently. Pt and wife will demonstrate understanding of precautions to take to prevent bleeding while pt is on blood thinners independently.  Outcome: Progressing

## 2019-06-21 NOTE — Care Management Note (Signed)
[facility] Individual Statement of Services  Patient Name:  Spencer Cree, MD  Date:  06/21/2019  Welcome to the [facility].  Our goal is to provide you with an individualized program based on your diagnosis and situation, designed to meet your specific needs.  With this comprehensive rehabilitation program, you will be expected to participate in at least 3 hours of rehabilitation therapies Monday-Friday, with modified therapy programming on the weekends.  Your rehabilitation program will include the following services:  Physical Therapy (PT), Occupational Therapy (OT), Speech Therapy (ST), 24 hour per day rehabilitation nursing, Neuropsychology, Case Management (Social Worker), Rehabilitation Medicine, Nutrition Services and Pharmacy Services  Weekly team conferences will be held on Wednesday to discuss your progress.  Your Social Worker will talk with you frequently to get your input and to update you on team discussions.  Team conferences with you and your family in attendance may also be held.  Expected length of stay: 7-10 days  Overall anticipated outcome: supervision with cues  Depending on your progress and recovery, your program may change. Your Social Worker will coordinate services and will keep you informed of any changes. Your Social Worker's name and contact numbers are listed  below.  The following services may also be recommended but are not provided by the [facility] will be made to provide these services after discharge if needed.  Arrangements include referral to agencies that provide these services.  Your insurance has been verified to be:  [place] Your primary doctor is:  [name]  Pertinent information will be shared with your doctor and your  insurance company.  Social Worker:  Ovidio Kin, [place] or ([phone]  Information discussed with and copy given to patient by: Elease Hashimoto, 06/21/2019, 10:42 AM

## 2019-06-21 NOTE — Progress Notes (Signed)
RN will call me when patient is ready to go on CPAP.  When I went to room to see if patient was ready,  RN informed me that the patient was not ready yet.  Patient was sitting in chair at bedside. No distress noted.  Will continue to monitor and wait for RN to call.

## 2019-06-21 NOTE — Progress Notes (Signed)
Occupational Therapy Session Note  Patient Details  Name: Spencer Folwell, MD MRN: [id number] Date of Birth: [birth year]  Today's Date: 06/21/2019 OT Individual Time: 1420-1534 OT Individual Time Calculation (min): 74 min   Short Term Goals: Week 1:  OT Short Term Goal 1 (Week 1): STGs=LTGs secondary to estimated short LOS  Skilled Therapeutic Interventions/Progress Updates:    Pt greeted in the recliner with no c/o pain. Spouse Opal Sidles present with multiple OT related questions. We discussed her concerns until there were no other questions for OT. Pt was agreeable to complete BADLs. He ambulated around the room to collect needed items and then showered. He stood 75% of the time during bathing. Spouse present to observe standing balance, discussed recommendation for pt to have 24/7 supervision for all functional mobility due to cognitive deficits. He needed vcs for ST memory and safety awareness during tx. Dressing completed EOB sit<stand after. He then combed his hair and shaved while standing at the sink. Close supervision when stooping to the floor to gather wet laundry towels to finish up ADL. The next portion of tx was focused on caregiver education. Jane ambulated with pt up/down [place] hallways providing CGA with gait belt. Pt needed questioning cues to find his way back to the room. Also practiced pt ambulating with grocery cart, straight path, turns, and backing up with Opal Sidles providing [place]. Advised for pt to stand statically with cart during grocery shopping post d/c while Opal Sidles retrieves items nearby. Also had pt go up/down 2 flights of stairs in stairwell. He only used the rails that he'd have in his home environment. At end of session pt returned to room, had Opal Sidles provide assist for in-room transfers and then cleared her off on safety plan. Discussed with Opal Sidles that she can assist pt with in-room transfers and ambulation only at this time. She verbalized understanding. Pt was left in  recliner with spouse present.     Per Opal Sidles, pt will not need any DME from OT. Relayed this information to SW.   Therapy Documentation Precautions:  Precautions Precautions: Fall Restrictions Weight Bearing Restrictions: No Vital Signs: Therapy Vitals Temp: 98.5 F (36.9 C) Temp Source: Oral Pulse Rate: (!) 53 Resp: 18 BP: 132/89 Patient Position (if appropriate): Lying Oxygen Therapy SpO2: 100 % O2 Device: Room Air Pain: Pain Assessment Pain Score: 0-No pain ADL:        Therapy/Group: Individual Therapy  Rhyse Skowron A Kahari Critzer 06/21/2019, 4:00 PM

## 2019-06-21 NOTE — Progress Notes (Signed)
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 Sleep disturbance   . Prediabetes   . Bradycardia   . Labile blood pressure   . Seizures ([place])   . Hyponatremia 06/18/2019  . Agitation 06/18/2019  . Encephalopathy 06/18/2019  . 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 Sleep apnea, central 03/26/2019  . Transient ischemic attack (TIA) 01/26/2019  . Transient diplopia 12/20/2018  . Dysesthesia affecting both sides of body 12/20/2018  . 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 Snoring 12/20/2018  . Nocturia more than twice per night 12/20/2018  . Insomnia disorder, with other sleep disorder, recurrent 12/20/2018  . Arthritis   . Cancer ([place])   . Elevated troponin   . Syncope 12/26/2016  . Family history of early CAD 12/26/2016  . Hyperlipidemia 08/15/2011  . PERSONAL HISTORY MALIGNANT NEOPLASM PROSTATE 07/11/2010   Past Medical History:  Past Medical History:  Diagnosis Date  . Allergy   . Arthritis   . Cancer ([place])   . Cataract   . Chronic kidney disease    atrophic left kidney  . Coronary atherosclerosis    a. 2012 - incidentally noted on CT scan of chest;  b. 08/2011 Myoview: normal; c. 03/2015 CPX @ [facility]: normal w/ PAC's, PVC's, and a 3 beat run of NSVT in recovery.  Marland Kitchen Heart murmur    asymptomatic function murmur.  Marland Kitchen History of echocardiogram    a. 12/2004 Echo: Nl EF, trace TR/MR.  Marland Kitchen Hx of radiation therapy   . Hyperlipidemia   . Personal history of malignant neoplasm of prostate    a. 2008 s/p prostatecomtomy-->radiation-->currently on Lupron injections.  . Sinus bradycardia   . Syncope    a. 12/2016   Past Surgical History:   Past Surgical History:  Procedure Laterality Date  . PROSTATECTOMY    . ROBOT ASSISTED LAPAROSCOPIC RADICAL PROSTATECTOMY    . SHOULDER SURGERY Right   . SKIN GRAFT Left   . TONSILLECTOMY     Social History:  reports that he has never smoked. He has never used smokeless tobacco. He reports current alcohol use of about 1.0 standard drinks of alcohol per week. He reports that he does not use drugs.  Family / Support Systems Marital Status: Married Patient Roles: Spouse, Parent Spouse/Significant Other: Opal Sidles [phone]  [phone] Children: One child in [place] and one child in [place] Other Supports: freinds and church members Anticipated Caregiver: wife Ability/Limitations of Caregiver: No limitations Caregiver Availability: 24/7 Family Dynamics: Close knit with two children who try to come home when they can. Church members and friends are supportive and involved.  Social History Preferred language: English Religion: Apostolic Cultural Background: No issues Education: MD urology Read: Yes Write: Yes Employment Status: Retired Public relations account executive Issues: No issues Guardian/Conservator: None-according to MD pt is capable of making his own decisions while here. Wife is very involved and plans to be involved in any decision while here   Abuse/Neglect Abuse/Neglect Assessment Can Be Completed: Yes Physical Abuse: Denies Verbal Abuse: Denies Sexual Abuse: Denies Exploitation of patient/patient's resources: Denies Self-Neglect: Denies  Emotional Status Pt's affect, behavior and adjustment status: Pt is motivated to improve, but allows his wife to  answer for him. He would like to get back to what he was doing prior to admission being mod/i but wife would supervise due to memory issues. Wife explains he has a processing issue and is being worked up at [employer] and [employer]. Recent Psychosocial Issues: several months of neuro decline-was beign worked up by neuro, [facility]  and wife was investigating going to [facility]-etc Psychiatric History: No history deferred depression screen due to coping appropriate but will ask neuro-psych to see while here. Pt will be a short length of stay due to high level Substance Abuse History: No issues  Patient / Family Perceptions, Expectations & Goals Pt/Family understanding of illness & functional limitations: Pt and wife can explain all of the MD's have seen and what has been told. Wife keeps very good notes and wants to find out exactly what is wrong with husband. Still continue to explore and see specialists and organizations. Premorbid pt/family roles/activities: husband, father, grandfather, retiree MD, church member, etc Anticipated changes in roles/activities/participation: resume Pt/family expectations/goals: Pt states: " I want to go home soon and do what I can do for myself."  Wife states: " My hope is he can get back to doing for himself with me being nearby and supervise."  [employer]: Other (Comment)(Work up at [facility], [facility]-etc) Premorbid Home Care/DME Agencies: None Transportation available at discharge: Wife Resource referrals recommended: Neuropsychology  Discharge Planning Living Arrangements: Spouse/significant other Support Systems: Spouse/significant other, Children, Water engineer, Social worker community Type of Residence: Private residence Insurance Resources: [employer], Multimedia programmer (specify)(BCBS) Museum/gallery curator Resources: Fish farm manager, Other (Comment) Financial Screen Referred: No Living Expenses: Own Money Management: Spouse Does the patient have any problems obtaining your medications?: No Home Management: Wife Patient/Family Preliminary Plans: Return home with wife who is able to provide supervision and was providing this prior to admission. Still pursuing specialists to pinpoint what is pt's diagnosis. Wife wants to be checked off on being able to ambulate with  husband. Since he is not one to sit still. Social Work Anticipated Follow Up Needs: HH/OP  Clinical Impression Pleasant gentleman who is motivated to get back to the level he was at prior to admission. Wife is very involved and his advocate. Aware short length of stay due to high level. OT to check off wife to ambulate pt in room when attends therapy today. Will have neruo-psych while here and work on discharge needs. Aware team conference Wed. MD to talk with wife regarding questions.  Elease Hashimoto 06/21/2019, 12:51 PM

## 2019-06-21 NOTE — Progress Notes (Signed)
Pt admitted to room [phone]. Pt presents with some confusion. Wife at bedside assisting with admission questions. Pt denies any pain or discomfort at this time. Continue plan of care.  Gerald Stabs, RN

## 2019-06-21 NOTE — Progress Notes (Signed)
Inpatient Rehabilitation  Patient information reviewed and entered into eRehab system by Damacio Weisgerber M. Tyrihanna Wingert, M.A., CCC/SLP, PPS Coordinator.  Information including medical coding, functional ability and quality indicators will be reviewed and updated through discharge.    

## 2019-06-21 NOTE — Progress Notes (Signed)
[facility] PHYSICAL MEDICINE & REHABILITATION PROGRESS NOTE  Subjective/Complaints: States he had a better night. Slept better. No significant confusion reported. Denies any further spells or sz activity  ROS: Patient denies fever, rash, sore throat, blurred vision, nausea, vomiting, diarrhea, cough, shortness of breath or chest pain, joint or back pain, headache, or mood change.    Objective: Vital Signs: Blood pressure 126/83, pulse (!) 50, temperature 97.8 F (36.6 C), temperature source Oral, resp. rate 18, height 5\' 9"  (1.753 m), weight 70.8 kg, SpO2 99 %. No results found. Recent Labs    06/21/19 0751  WBC 5.3  HGB 15.0  HCT 44.3  PLT 266   Recent Labs    06/21/19 0751  NA 134*  K 4.4  CL 100  CO2 23  GLUCOSE 94  BUN 12  CREATININE 1.05  CALCIUM 9.5    Physical Exam: BP 126/83 (BP Location: Left Arm)   Pulse (!) 50   Temp 97.8 F (36.6 C) (Oral)   Resp 18   Ht 5\' 9"  (1.753 m)   Wt 70.8 kg   SpO2 99%   BMI 23.04 kg/m  Constitutional: No distress . Vital signs reviewed. HEENT: EOMI, oral membranes moist Neck: supple Cardiovascular: RRR without murmur. No JVD    Respiratory: CTA Bilaterally without wheezes or rales. Normal effort    GI: BS +, non-tender, non-distended  Skin: Warm and dry.  Intact. Psych: Flat. Musc: No edema in extremities.  No tenderness in extremities. Neurological: Alert and oriented to person, hospital, remembered me. Word finding difficulties however improving Still a little slow to process. Motor: Grossly 4+/5 in all 4's. stable  Assessment/Plan: 1. Functional deficits secondary to encephalopathy which require 3+ hours per day of interdisciplinary therapy in a comprehensive inpatient rehab setting.  Physiatrist is providing close team supervision and 24 hour management of active medical problems listed below.  Physiatrist and rehab team continue to assess barriers to discharge/monitor patient progress toward functional and  medical goals  Care Tool:  Bathing    Body parts bathed by patient: Right arm, Left arm, Chest, Abdomen, Front perineal area, Right upper leg, Buttocks, Left upper leg, Right lower leg, Left lower leg, Face         Bathing assist Assist Level: Contact Guard/Touching assist     Upper Body Dressing/Undressing Upper body dressing   What is the patient wearing?: Pull over shirt    Upper body assist Assist Level: Set up assist    Lower Body Dressing/Undressing Lower body dressing      What is the patient wearing?: Pants     Lower body assist Assist for lower body dressing: Minimal Assistance - Patient > 75%     Toileting Toileting    Toileting assist Assist for toileting: Contact Guard/Touching assist     Transfers Chair/bed transfer  Transfers assist     Chair/bed transfer assist level: Contact Guard/Touching assist     Locomotion Ambulation   Ambulation assist      Assist level: Contact Guard/Touching assist Assistive device: No Device Max distance: 150 feet   Walk 10 feet activity   Assist     Assist level: Contact Guard/Touching assist     Walk 50 feet activity   Assist    Assist level: Contact Guard/Touching assist      Walk 150 feet activity   Assist    Assist level: Contact Guard/Touching assist      Walk 10 feet on uneven surface  activity   Assist  Walk 10 feet on uneven surfaces activity did not occur: Safety/medical concerns         Wheelchair     Assist Will patient use wheelchair at discharge?: No Type of Wheelchair: Manual    Wheelchair assist level: Contact Guard/Touching assist Max wheelchair distance: 125    Wheelchair 50 feet with 2 turns activity    Assist        Assist Level: Contact Guard/Touching assist   Wheelchair 150 feet activity     Assist Wheelchair 150 feet activity did not occur: Safety/medical concerns          Medical Problem List and Plan: 1.Gait dysfunction  with speech difficultysecondary tocomplicated seizure with encephalopathy as well as incidental findings of 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  Continue CIR 2. Antithrombotics: -DVT/anticoagulation:SCDs. -antiplatelet therapy: Aspirin 81 mg daily and Plavix 75 mg daily x3 weeks then aspirin alone 3. Pain Management:Tylenol as needed 4. Mood:Provide emotional support   Seroquel 12.5 nightly started on 10/18 with some benefit---continue -antipsychotic agents: N/A 5. Neuropsych: This patientisnot fully capable of making decisions on hisown behalf.  Telesitter for safety 6. Skin/Wound Care:Routine skin checks 7. Fluids/Electrolytes/Nutrition:Routine in and outs 8. Seizure: Keppra XR500 mg daily initiated  No seizures since admission to rehab 9.Hyperlipidemia. Zetia 10. History of prostate cancer 2008 status post prostatectomy as well as radiation therapy. Receives Lupron injection 30 mg every 6 months 11.OSA.Patient had been on CPAP at home.   12.  Labile blood pressure  Improved 10/19---monitor for now 13.  Bradycardia  Persistent, but asymptomatic on 10/19  Continue to monitor with increased mobility 14.  Prediabetes  Monitor with increased mobility 15.  Hyponatremia  Sodium 134 10/19  Labs ordered for tomorrow 16.  Sleep disturbance  Trazodone replaced with seroquel--good results so far  LOS: 3 days A FACE TO FACE EVALUATION WAS PERFORMED  Meredith Staggers 06/21/2019, 11:20 AM

## 2019-06-21 NOTE — Progress Notes (Signed)
Speech Language Pathology Daily Session Note  Patient Details  Name: Spencer Tienken, MD MRN: [id number] Date of Birth: 01-29-1941  Today's Date: 06/21/2019 SLP Individual Time: 1000-1057 SLP Individual Time Calculation (min): 57 min  Short Term Goals: Week 1: SLP Short Term Goal 1 (Week 1): STGs=LTGs due to ELOS  Skilled Therapeutic Interventions: Pt was seen for skilled ST targeting cognitive goals. Pt's wife was present and supportive throughout session. Pt was independently oriented to self, date, and situation, however Mod A verbal and visual cues required for pt to use environmental clues and signage to orient to place. In conversation with pt and wife, they expressed desire to prioritize short term memory goals in therapy at this time, as they believe pt's verbal expression/word finding challenges are getting better each day. In functional conversation regarding pt's medications. He recalled name/functions of 50% of familiar medications (which he took prior to hospitalization), Total A provided by wife to recall name and function of other 50%. SLP provided Min A verbal and visual cues to assist pt with setting a recurring daily alarm in his cell phone as compensatory memory aid for accurate medication management upon his return home. Although he reported never having used phone alarms, pt's wife stated he did typically use them at home. Pen and paper also left for pt to engage in note taking to aid in short term memory throughout his stay. SLP further facilitated session with Supervision A verbal cues for problem solving during a basic money management activity (ALFA). Pt was left sitting in recliner with seat alarm activated and wife still present. Continue per current plan of care.      Pain Pain Assessment Pain Score: 0-No pain  Therapy/Group: Individual Therapy  Arbutus Leas 06/21/2019, 12:10 PM

## 2019-06-21 NOTE — Progress Notes (Signed)
Physical Therapy Session Note  Patient Details  Name: Spencer Mapel, MD MRN: [id number] Date of Birth: [birth year]  Today's Date: 06/21/2019 PT Individual Time: 0803-0903 PT Individual Time Calculation (min): 60 min   Short Term Goals: Week 1:  PT Short Term Goal 1 (Week 1): STGs=LTGs d/t ELOS  Skilled Therapeutic Interventions/Progress Updates: Pt presented in bed agreeable to therapy. Pt denies pain throughout session. Pt performed bed mobility with supervision and ambulated throughout room obtaining clothes to change. Pt doffed and donned all clothing with supervision and donned socks/shoes with set up. Pt ambulated to rehab gym CGA no AD and participated in obstacle course weaving though cones and stepping over threshold CGA with x 1 occurrence of hitting cone. Pt then participated in 4 square activities stepping over hockey sticks. Pt was able to step forwards/backwards and forward backward diagonals without LOB nor hitting stick with CGA. Pt ambulated to day room no AD with close S and participated in NuStep L4 x 6 min for global conditioning. Pt ambulated back to room and returned to recliner negotiating around bed with no LOB. Pt remained in recliner at end of session and left with seat alarm on, call bell within reach and needs met.      Therapy Documentation Precautions:  Precautions Precautions: Fall Restrictions Weight Bearing Restrictions: No General:   Vital Signs:    Therapy/Group: Individual Therapy  Meeya Goldin  Ramonda Galyon, PTA  06/21/2019, 12:25 PM

## 2019-06-22 ENCOUNTER — Inpatient Hospital Stay (HOSPITAL_COMMUNITY): Payer: Medicare Other | Admitting: Occupational Therapy

## 2019-06-22 ENCOUNTER — Encounter (HOSPITAL_COMMUNITY): Payer: Medicare Other | Admitting: Psychology

## 2019-06-22 ENCOUNTER — Inpatient Hospital Stay (HOSPITAL_COMMUNITY): Payer: Medicare Other | Admitting: Physical Therapy

## 2019-06-22 ENCOUNTER — Inpatient Hospital Stay (HOSPITAL_COMMUNITY): Payer: Medicare Other

## 2019-06-22 DIAGNOSIS — R4587 Impulsiveness: Secondary | ICD-10-CM

## 2019-06-22 MED ORDER — BISACODYL 10 MG RE SUPP: 10.0000 mg | Freq: Every day | RECTAL | Status: DC | PRN

## 2019-06-22 NOTE — Progress Notes (Signed)
[facility] PHYSICAL MEDICINE & REHABILITATION PROGRESS NOTE  Subjective/Complaints: Patient seen laying in bed this morning.  He states he slept well overnight.  He is still impulsive per nurse tech.  Cognition overall appears to be improving.  ROS: Denies CP, SOB, N/V/D  Objective: Vital Signs: Blood pressure 130/83, pulse 63, temperature 98 F (36.7 C), temperature source Oral, resp. rate 16, height 5\' 9"  (1.753 m), weight 70.8 kg, SpO2 97 %. No results found. Recent Labs    06/21/19 0751  WBC 5.3  HGB 15.0  HCT 44.3  PLT 266   Recent Labs    06/21/19 0751  NA 134*  K 4.4  CL 100  CO2 23  GLUCOSE 94  BUN 12  CREATININE 1.05  CALCIUM 9.5    Physical Exam: BP 130/83 (BP Location: Right Arm)   Pulse 63   Temp 98 F (36.7 C) (Oral)   Resp 16   Ht 5\' 9"  (1.753 m)   Wt 70.8 kg   SpO2 97%   BMI 23.04 kg/m  Constitutional: No distress . Vital signs reviewed. HENT: Normocephalic.  Atraumatic. Eyes: EOMI. No discharge. Cardiovascular: No JVD. Respiratory: Normal effort.  No stridor. GI: Non-distended. Skin: Warm and dry.  Intact. Psych: Flat. Musc: No edema in extremities.  No tenderness in extremities. Neurological: Alert oriented x3 with increased time Word finding difficulties however improving Slow processing Motor: Grossly 4+/5 throughout  Assessment/Plan: 1. Functional deficits secondary to encephalopathy which require 3+ hours per day of interdisciplinary therapy in a comprehensive inpatient rehab setting.  Physiatrist is providing close team supervision and 24 hour management of active medical problems listed below.  Physiatrist and rehab team continue to assess barriers to discharge/monitor patient progress toward functional and medical goals  Care Tool:  Bathing    Body parts bathed by patient: Right arm, Left arm, Chest, Abdomen, Front perineal area, Right upper leg, Buttocks, Left upper leg, Right lower leg, Left lower leg, Face          Bathing assist Assist Level: Supervision/Verbal cueing     Upper Body Dressing/Undressing Upper body dressing   What is the patient wearing?: Pull over shirt    Upper body assist Assist Level: Set up assist    Lower Body Dressing/Undressing Lower body dressing      What is the patient wearing?: Pants, Underwear/pull up     Lower body assist Assist for lower body dressing: Supervision/Verbal cueing     Toileting Toileting    Toileting assist Assist for toileting: Contact Guard/Touching assist     Transfers Chair/bed transfer  Transfers assist     Chair/bed transfer assist level: Contact Guard/Touching assist     Locomotion Ambulation   Ambulation assist      Assist level: Contact Guard/Touching assist Assistive device: No Device Max distance: 150 feet   Walk 10 feet activity   Assist     Assist level: Contact Guard/Touching assist     Walk 50 feet activity   Assist    Assist level: Contact Guard/Touching assist      Walk 150 feet activity   Assist    Assist level: Contact Guard/Touching assist      Walk 10 feet on uneven surface  activity   Assist Walk 10 feet on uneven surfaces activity did not occur: Safety/medical concerns         Wheelchair     Assist Will patient use wheelchair at discharge?: No Type of Wheelchair: Manual    Wheelchair assist level:  Contact Guard/Touching assist Max wheelchair distance: 125    Wheelchair 50 feet with 2 turns activity    Assist        Assist Level: Contact Guard/Touching assist   Wheelchair 150 feet activity     Assist Wheelchair 150 feet activity did not occur: Safety/medical concerns          Medical Problem List and Plan: 1.Gait dysfunction with speech difficultysecondary tocomplicated seizure with encephalopathy as well as incidental findings of small left middle cerebral artery branchabnormalitylikely embolic etiology, ptstatus post  TPA.  Continue CIR 2. Antithrombotics: -DVT/anticoagulation:SCDs. -antiplatelet therapy: Aspirin 81 mg daily and Plavix 75 mg daily x3 weeks then aspirin alone 3. Pain Management:Tylenol as needed 4. Mood:Provide emotional support   Seroquel 12.5 nightly started on 10/18 with benefit -antipsychotic agents: N/A 5. Neuropsych: This patientisnot fully capable of making decisions on hisown behalf.  Continue telesitter for safety 6. Skin/Wound Care:Routine skin checks 7. Fluids/Electrolytes/Nutrition:Routine in and outs 8. Seizure: Keppra XR500 mg daily initiated  No seizures from admission to rehab to 10/20 9.Hyperlipidemia. Zetia 10. History of prostate cancer 2008 status post prostatectomy as well as radiation therapy. Receives Lupron injection 30 mg every 6 months 11.OSA.Patient had been on CPAP at home.   12.  Labile blood pressure  Controlled on 10/20 13.  Bradycardia  Slightly labile, but asymptomatic on 10/20  Continue to monitor with increased mobility 14.  Prediabetes  Monitor with increased mobility 15.  Hyponatremia  Sodium 134 on 10/19 16.  Sleep disturbance  Trazodone replaced with seroquel  Improving  LOS: 4 days A FACE TO FACE EVALUATION WAS PERFORMED  Timmie Calix Lorie Phenix 06/22/2019, 9:56 AM

## 2019-06-22 NOTE — Progress Notes (Signed)
Social Work Patient ID: Spencer Cree, MD, male   DOB: June 22, 1941, 78 y.o.   MRN: [id number]  Team feels pt will be ready for discharge Thursday, wife has been checked off to ambulate with pt on the unit. She will be here tomorrow to attend all therapies. Team recommends OP and will make referral to Neuro-OP.

## 2019-06-22 NOTE — Progress Notes (Addendum)
Speech Language Pathology Daily Session Note  Patient Details  Name: Spencer Colmenares, MD MRN: [id number] Date of Birth: 1940/11/29  Today's Date: 06/22/2019 SLP Individual Time: 0903-1000 SLP Individual Time Calculation (min): 57 min  Short Term Goals: Week 1: SLP Short Term Goal 1 (Week 1): STGs=LTGs due to ELOS  Skilled Therapeutic Interventions: Skilled ST services focused on education and cognitive skills. SLP facilitated short term recall of medication name given function, following instruction of memory strategy WRAP, pt demonstrated initial recall of 3 out 5 medication names with 1 minute delay and following several trials and use of strategies (repeating, writing, association phrase and visualizing) pt demonstrate recall of 4 out 5 medications without cues and 5 out 5 medications names with simple verbal cues to recall assiocation phrase with 7 minute delay. SLP provided education  Of memory strategies WRAP to pt and pt's wife, with pt demonstrating use of strategies. All questions were answered to satisfaction. SLP also facilitated semi-complex problem solving skills in BID pill organizer task, pt required supervision A verbal cues for error awareness in a mildly distracting environment.  Pt was left in room with wife. ST recommends to continue skilled ST services.      Pain Pain Assessment Pain Scale: 0-10 Pain Score: 0-No pain  Therapy/Group: Individual Therapy  Karin Pinedo  [facility] 06/22/2019, 11:21 AM

## 2019-06-22 NOTE — Progress Notes (Signed)
Pt has episode where he state his throat was "feeling tight" after administering sorbitol. O2 Sat 98% other vital signs WDL. Denies other symptoms. Dan PA made aware. Pt eventually recovered and "throat tightness" is better. Sorbitol is now discontinued.  Pt manually disimpacted. Able to get type 1 by disimpaction then pt was able to push type 4 stool out. Noted some blood in stool. Pt states he has hemorrhoids. New order obtained. Continue plan of care. Gerald Stabs, RN

## 2019-06-22 NOTE — Progress Notes (Signed)
Spoke with wife at length she is concerned about patient being a bit lethargic today and request for Seroquel to be held for now.  Medication has been discontinued for now and continue to monitor.  All questions were answered with wife at length at bedside

## 2019-06-22 NOTE — Progress Notes (Signed)
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 Pt's wife reports feeling comfortable with dressing and bathing tasks as well as ambulation. Pt ambulating 200' to ADL apartment with supervision and without use of AD. Focus of session on kitchen mobility tasks and cognitive strategies. Pt utilized labels on cabinets to take clean dishes from washer and plan in appropriately labeled cabinet. Pt verbalized step by step meal he would make often at home with min verbal guidance cuing and wife confirmed information. OT encouraged pt to cook at home with guidance of wife for safety. Pt required mod cuing for navigation to locate room from ADL apartment. Pt left in his room with caregiver and all needs within reach.   Therapy Documentation Precautions:  Precautions Precautions: Fall Restrictions Weight Bearing Restrictions: No General:   Vital Signs: Therapy Vitals Temp: 98 F (36.7 C) Pulse Rate: 73 Resp: 18 BP: 129/85 Patient Position (if appropriate): Sitting Oxygen Therapy SpO2: 100 % O2 Device: Room Air   Therapy/Group: Individual Therapy  Gypsy Decant 06/22/2019, 4:21 PM

## 2019-06-22 NOTE — Discharge Summary (Signed)
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  Admit date: 06/18/2019 Discharge date: 06/24/2019  Discharge Diagnoses:  Principal Problem:   Encephalopathy Active Problems:   Prediabetes   Bradycardia   Labile blood pressure   Seizures (HCC)   Sleep disturbance   Impulsive   Slow transit constipation DVT prophylaxis Hyperlipidemia History of prostate cancer with prostatectomy radiation therapy OSA  Discharged Condition: Stable  Significant Diagnostic Studies: Ct Angio Head W Or Wo Contrast  Result Date: 06/15/2019 CLINICAL DATA:  Acute presentation with slurred speech and aphasia. EXAM: CT ANGIOGRAPHY HEAD AND NECK TECHNIQUE: Multidetector CT imaging of the head and neck was performed using the standard protocol during bolus administration of intravenous contrast. Multiplanar CT image reconstructions and MIPs were obtained to evaluate the vascular anatomy. Carotid stenosis measurements (when applicable) are obtained utilizing NASCET criteria, using the distal internal carotid diameter as the denominator. CONTRAST:  25mL OMNIPAQUE IOHEXOL 350 MG/ML SOLN COMPARISON:  Head CT earlier same day. Multiple previous neuroimaging studies. FINDINGS: CTA NECK FINDINGS Aortic arch: Aortic atherosclerosis and tortuosity. No brachiocephalic vessel origin stenosis. Right carotid system: Common carotid artery is widely patent to the bifurcation. No atherosclerotic disease at the bifurcation. Cervical ICA is tortuous but otherwise normal. Left carotid system: Common carotid artery is widely patent to the bifurcation. No atherosclerotic disease at the bifurcation. Cervical ICA is tortuous but otherwise normal. Vertebral arteries: Both vertebral arteries are patent at their origins and through the cervical region to the foramen magnum. Skeleton: Ordinary mild cervical spondylosis. Other neck: No mass or adenopathy. Upper chest: Minimal pleural  and parenchymal scarring at the apices. Review of the MIP images confirms the above findings CTA HEAD FINDINGS Anterior circulation: Both internal carotid arteries are patent through the skull base and siphon regions. Ordinary siphon atherosclerotic calcification without stenosis. The anterior and middle cerebral vessels are patent without proximal stenosis, aneurysm or vascular malformation. Fetal origin left PCA. No large or medium vessel occlusion identified. Posterior circulation: Both vertebral arteries are widely patent to the basilar. No basilar stenosis. Posterior circulation branch vessels appear normal. Venous sinuses: Patent and normal. Anatomic variants: None significant. Review of the MIP images confirms the above findings IMPRESSION: No large or medium vessel occlusion. No atherosclerotic disease at the carotid bifurcations. Ordinary nonstenotic calcification in the carotid siphon regions. Electronically Signed   By: Nelson Chimes M.D.   On: 06/15/2019 17:27   Ct Angio Neck W Or Wo Contrast  Result Date: 06/15/2019 CLINICAL DATA:  Acute presentation with slurred speech and aphasia. EXAM: CT ANGIOGRAPHY HEAD AND NECK TECHNIQUE: Multidetector CT imaging of the head and neck was performed using the standard protocol during bolus administration of intravenous contrast. Multiplanar CT image reconstructions and MIPs were obtained to evaluate the vascular anatomy. Carotid stenosis measurements (when applicable) are obtained utilizing NASCET criteria, using the distal internal carotid diameter as the denominator. CONTRAST:  17mL OMNIPAQUE IOHEXOL 350 MG/ML SOLN COMPARISON:  Head CT earlier same day. Multiple previous neuroimaging studies. FINDINGS: CTA NECK FINDINGS Aortic arch: Aortic atherosclerosis and tortuosity. No brachiocephalic vessel origin stenosis. Right carotid system: Common carotid artery is widely patent to the bifurcation. No atherosclerotic disease at the bifurcation. Cervical ICA is  tortuous but otherwise normal. Left carotid system: Common carotid artery is widely patent to the bifurcation. No atherosclerotic disease at the bifurcation. Cervical ICA is tortuous but otherwise normal. Vertebral arteries: Both vertebral arteries are patent at their origins and through the cervical  region to the foramen magnum. Skeleton: Ordinary mild cervical spondylosis. Other neck: No mass or adenopathy. Upper chest: Minimal pleural and parenchymal scarring at the apices. Review of the MIP images confirms the above findings CTA HEAD FINDINGS Anterior circulation: Both internal carotid arteries are patent through the skull base and siphon regions. Ordinary siphon atherosclerotic calcification without stenosis. The anterior and middle cerebral vessels are patent without proximal stenosis, aneurysm or vascular malformation. Fetal origin left PCA. No large or medium vessel occlusion identified. Posterior circulation: Both vertebral arteries are widely patent to the basilar. No basilar stenosis. Posterior circulation branch vessels appear normal. Venous sinuses: Patent and normal. Anatomic variants: None significant. Review of the MIP images confirms the above findings IMPRESSION: No large or medium vessel occlusion. No atherosclerotic disease at the carotid bifurcations. Ordinary nonstenotic calcification in the carotid siphon regions. Electronically Signed   By: Nelson Chimes M.D.   On: 06/15/2019 17:27   Mr Brain Wo Contrast  Addendum Date: 06/16/2019   ADDENDUM REPORT: 06/16/2019 21:02 ADDENDUM: A 4 mm subcortical infarct is present in the right left frontal operculum on image 36 of series 2. Findings were discussed with Dr. Leonie Man at 5:45 p.m. Electronically Signed   By: San Morelle M.D.   On: 06/16/2019 21:02   Result Date: 06/16/2019 CLINICAL DATA:  Stroke, follow-up. Status post tPA. Slurred speech. EXAM: MRI HEAD WITHOUT CONTRAST TECHNIQUE: Multiplanar, multiecho pulse sequences of the brain  and surrounding structures were obtained without intravenous contrast. COMPARISON:  CT head and CTA head and neck head/13/20. MR head without and with contrast 01/31/2019. FINDINGS: Brain: Diffusion-weighted images demonstrate no acute or subacute infarction. Mild generalized atrophy and white matter disease is stable. No acute hemorrhage or mass lesion is present. The ventricles are of normal size. No significant extraaxial fluid collection is present. Dilated perivascular spaces are present in the basal ganglia. The brainstem and cerebellum are within normal limits. Vascular: Flow is present in the major intracranial arteries. Skull and upper cervical spine: The craniocervical junction is normal. Upper cervical spine is within normal limits. Marrow signal is unremarkable. Sinuses/Orbits: The paranasal sinuses and mastoid air cells are clear. Left lens replacement is noted. Globes and orbits are otherwise within normal limits. IMPRESSION: 1. No acute intracranial abnormality or significant interval change. 2. Stable atrophy and white matter disease. Electronically Signed: By: San Morelle M.D. On: 06/16/2019 17:18   Ct Head Code Stroke Wo Contrast  Result Date: 06/15/2019 CLINICAL DATA:  Code stroke.  Slurred speech. Aphasia. EXAM: CT HEAD WITHOUT CONTRAST TECHNIQUE: Contiguous axial images were obtained from the base of the skull through the vertex without intravenous contrast. COMPARISON:  05/21/2019 FINDINGS: Brain: Generalized atrophy. Mild to moderate chronic small-vessel ischemic changes of the white matter. No sign of acute infarction, mass lesion, hemorrhage, hydrocephalus or extra-axial collection. Vascular: There is atherosclerotic calcification of the major vessels at the base of the brain. Skull: Negative Sinuses/Orbits: Clear/normal Other: None ASPECTS ([place] Stroke Program Early CT Score) - Ganglionic level infarction (caudate, lentiform nuclei, internal capsule, insula, M1-M3 cortex):  7 - Supraganglionic infarction (M4-M6 cortex): 3 Total score (0-10 with 10 being normal): 10 IMPRESSION: No acute finding by CT. Atrophy and chronic small-vessel ischemic changes of the white matter. *ASPECTS is 10. * These results were communicated to Dr. Leonie Man at 4:56 pmon 10/13/2020by text page via the [facility] messaging system. Electronically Signed   By: Nelson Chimes M.D.   On: 06/15/2019 16:58   Dg Fluoro Guided Loc Of Needle/cath Tip  For Spinal Inject Lt  Result Date: 05/27/2019 CLINICAL DATA:  Memory disturbance.  Assess for viral syndromes. EXAM: Fluoroscopically guided lumbar puncture FLUOROSCOPY TIME:  Fluoroscopy Time: 0 minutes 12 seconds. 12.47 micro gray meter squared COMPARISON:  MRI report 08/05/1999 FINDINGS: Left-sided approach was taken to the interlaminar space at L4-5. The overlying skin was scrubbed with Betadine and draped in sterile fashion. Skin anesthesia was carried out using 1% lidocaine. 20 gauge spinal needle was placed on 1 pass into the thecal sac. Opening pressure was 17 cm water. 10 cc of clear CSF were collected in 3 to is for requested studies. Patient was kept for 1 hour and then discharged with instructions to lie down for the remainder of the day. IMPRESSION: Lumbar puncture on the left at L4-5. Normal opening pressure. 10 cc clear CSF collected and sent for requested studies. Electronically Signed   By: Nelson Chimes M.D.   On: 05/27/2019 09:34    Labs:  Basic Metabolic Panel: Recent Labs  Lab 06/21/19 0751  NA 134*  K 4.4  CL 100  CO2 23  GLUCOSE 94  BUN 12  CREATININE 1.05  CALCIUM 9.5    CBC: Recent Labs  Lab 06/21/19 0751  WBC 5.3  NEUTROABS 3.2  HGB 15.0  HCT 44.3  MCV 90.2  PLT 266    CBG: No results for input(s): GLUCAP in the last 168 hours.  Family history.  Mother with hypertension, thyroid disease.  Father with CVA and CAD.  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  Presented 06/15/2019 with altered mental status and speech difficulty.  Per report and discussion with wife has had progressive neurological issues for the past several months has been seen outpatient neurology by Dr. Brett Fairy with several CT scans of the head showing no definitive etiology.  No history of documented seizure or seizure-like episodes.  He had a PET scan 04/06/2019 which did not suggest any diminished bilateral cortical metabolism to raise concern about Alzheimer's.  Patient's had neuropsychological test battery recently which suggested posterior cortical atrophy variant.  His last Mini-Mental exam was 25 out of 30 on 06/08/2019 with his visit to neurology services.  Latest chemistries with sodium 130, Covid negative, WBC 5.1.  Cranial CT scan 06/15/2019 showed no acute findings by CT.  Noted atrophy and chronic small vessel ischemic changes of the white matter.  CT angiogram of head and neck with no large vessel or medium vessel occlusion.  MRI did not identify any acute abnormality or significant interval change.  Patient did receive TPA.  Echocardiogram with ejection fraction of 65% without emboli.  EEG 06/17/2019 completed suggestive of cortical dysfunction in the left frontotemporal region nonspecific etiology no seizure noted.  This EEG demonstrated improvement compared to 06/16/2019 as frontotemporal slowing was now intermittent question post ictal state.  He was placed on Keppra for seizure prophylaxis.  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  Maintain on aspirin and Plavix x3 weeks then aspirin alone.  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 Past admission physiatrist, therapy team and rehab RN have worked together to provide customized collaborative inpatient rehab.  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  Patient did receive TPA would follow-up with neurology services.  Maintain on aspirin and Plavix x3 weeks then aspirin alone.  Mood stabilization initially with Seroquel later discontinued due to some lethargy monitoring of sleep pattern.  He continued on Keppra 500 daily no seizure activity noted.  Zetia ongoing for hyperlipidemia.  He did have a history of prostate cancer prostatectomy radiation therapy receiving Lupron injections every 6 months.  He did have a history of obstructive sleep apnea remained on CPAP.  Mild hyponatremia improved to 134.   Blood pressures were monitored on TID basis and stable  He/ is continent of bowel and bladder.  He/ has made gains during rehab stay and is attending therapies  He/S will continue to receive follow up therapies   after discharge  Rehab course: During patient's stay in rehab weekly team conferences were held to monitor patient's progress, set goals and discuss barriers to discharge. At admission, patient required moderate assist ambulate 125 feet one-person hand-held assistance, minimal guard sit to stand, minimal guard supine to sit.  Supervision set up for grooming minimal assist upper body bathing moderate assist lower body bathing minimal assist upper body dressing  Physical exam.  Blood pressure 122/75 pulse 47 temperature 98 respirations 16 oxygen saturations 100% room air Constitutional well-developed well-nourished no distress HEENT Head.   Normocephalic and atraumatic Eyes.  Pupils round and reactive to light no discharge without nystagmus Neck.  Supple nontender no JVD no thyromegaly Cardiovascular normal rate and rhythm no friction rub or murmur heard Respiratory.  Effort normal no respiratory distress no wheezes GI.  Soft exhibits no distention nontender without rebound Neurological displays normal reflexes no cranial nerve deficits alert oriented to month and year mild word finding deficits and motor planning.  No PD but decreased fine motor coordination of right upper right lower extremity.  Strength grossly graded 4+ out of 5 in all fours no gross sensory deficits.  He/  has had improvement in activity tolerance, balance, postural control as well as ability to compensate for deficits. He/ has had improvement in functional use RUE/LUE  and RLE/LLE as well as improvement in awareness.  Patient perform bed mobility with supervision ambulates throughout the unit obtaining close to change to doff and don all clothing supervision and donning sock shoes with set up.  Patient ambulates to the rehab gym contact-guard assist no assistive device.  Participated in 4 square activity stepping over hockey sticks patient able to step forwards backwards and forward backward diagonal without loss of balance.  Speech therapy follow-up patient independently oriented to self date and situation however moderate assist verbal and visual cues required for patient to use environmental clues and signage to orient to place.  In conversation with patient and wife they expressed desire to prioritize short-term memory goals in therapy.  He recalled name functions of 50% or familiar medications.  It was advised the wife the need for supervision which she was able to provide.       Disposition: Discharge disposition: 01-Home or Self Care     Discharged home   Diet: Regular  Special Instructions: No driving smoking or alcohol  Aspirin and Plavix x3  weeks then aspirin  alone.  Medications at discharge. 1.  Tylenol as needed 2.  Aspirin 81 mg p.o. daily 3.  Plavix 75 mg p.o. daily 4.  Zetia 10 mg p.o. daily 5.  Keppra Exar 500 mg p.o. daily  Discharge Instructions    Ambulatory referral to Neurology   Complete by: As directed    An appointment is requested in approximately 4 weeks left MCA branch infarction   Ambulatory referral to Physical Medicine Rehab   Complete by: As directed    Moderate complexity 1-2 weeks encephalopathy with left MCA branch occlusion CVA      Follow-up Information    Kirsteins, Luanna Salk, MD Follow up.   Specialty: Physical Medicine and Rehabilitation Why:  office to call for appointment Contact information: [place] [address] [phone]           Signed: Cathlyn Parsons 06/24/2019, 5:17 AM

## 2019-06-22 NOTE — Plan of Care (Signed)
  Problem: Consults Goal: RH STROKE PATIENT EDUCATION Description: See Patient Education module for education specifics  Outcome: Progressing Goal: Nutrition Consult-if indicated Outcome: Progressing   Problem: RH BOWEL ELIMINATION Goal: RH STG MANAGE BOWEL WITH ASSISTANCE Description: STG Manage Bowel with mod I Assistance. Outcome: Progressing   Problem: RH BLADDER ELIMINATION Goal: RH STG MANAGE BLADDER WITH ASSISTANCE Description: STG Manage Bladder With mod I Assistance Outcome: Progressing Goal: RH STG MANAGE BLADDER WITH EQUIPMENT WITH ASSISTANCE Description: STG Manage Bladder With Equipment With Mod I Assistance Outcome: Progressing   Problem: RH SAFETY Goal: RH STG ADHERE TO SAFETY PRECAUTIONS W/ASSISTANCE/DEVICE Description: STG Adhere to Safety Precautions With verbal cues and reminders Assistance/Device. Outcome: Progressing Goal: RH STG DECREASED RISK OF FALL WITH ASSISTANCE Description: STG Decreased Risk of Fall With verbal cues and reminders Assistance. Outcome: Progressing   Problem: RH KNOWLEDGE DEFICIT Goal: RH STG INCREASE KNOWLEGDE OF HYPERLIPIDEMIA Description:  Pt and wife will be able demonstrate understanding of dietary modifications to control cholesterol levels independently.  Outcome: Progressing Goal: RH STG INCREASE KNOWLEDGE OF STROKE PROPHYLAXIS Description:  Pt and wife will be able demonstrate understanding of dietary modifications to control blood pressure independently. Pt and wife will demonstrate understanding of precautions to take to prevent bleeding while pt is on blood thinners independently.  Outcome: Progressing

## 2019-06-22 NOTE — Progress Notes (Signed)
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 Pt performed toilet transfer with supervision and performed hand hygiene in standing. Pt picked up shoes with wife providing cues to sit at EOB to don shoes. Provided edu with wife regarding supervision and verbal cues when needed. Spencer Underwood, wife verbalized understanding. Pt and wife ambulated throughout hospital, in atrium, and outside entrance of [facility] for community ambulation. Pt demonstrated fair safety with ambulation with wife providing min cues when needed.Pt noted to have decreased L foot clearance with pt and wife indicating that he was doing that prior to recent hospilization. Pt required no rest breaks during ambulation throughout facility. Pt ascended up flight of stairs with wife and PTA providing supervision and pt ascending with step through pattern with x 1 rail. Upon returning to unit, pt participated in gait with treadmill at speed 1.5 mph with level 2 incline for x 10 min. Pt noted to have narrow BOS which he was able to correct with verbal cues. Pt noted no significant fatigue with activity. After brief rest pt participated in agility ladder for coordination with pt requiring CGA for wide abduction steps. Pt ambulated back to room at end of session and left with wife supervising pt to bathroom.      Therapy Documentation Precautions:  Precautions Precautions: Fall Restrictions Weight Bearing Restrictions: No General:   Vital Signs: Therapy Vitals Temp: 98 F (36.7 C) Pulse Rate: 73 Resp: 18 BP: 129/85 Patient Position (if appropriate): Sitting Oxygen  Therapy SpO2: 100 % O2 Device: Room Air    Therapy/Group: Individual Therapy  Krystyne Tewksbury  [place], PTA  06/22/2019, 4:22 PM

## 2019-06-23 ENCOUNTER — Inpatient Hospital Stay (HOSPITAL_COMMUNITY): Payer: Medicare Other | Admitting: Physical Therapy

## 2019-06-23 ENCOUNTER — Inpatient Hospital Stay (HOSPITAL_COMMUNITY): Payer: Medicare Other

## 2019-06-23 ENCOUNTER — Inpatient Hospital Stay (HOSPITAL_COMMUNITY): Payer: Medicare Other | Admitting: Occupational Therapy

## 2019-06-23 DIAGNOSIS — K5901 Slow transit constipation: Secondary | ICD-10-CM

## 2019-06-23 MED ORDER — CLOPIDOGREL BISULFATE 75 MG PO TABS: 75.0000 mg | ORAL_TABLET | Freq: Every day | ORAL | 0 refills | Status: DC

## 2019-06-23 MED ORDER — POLYETHYLENE GLYCOL 3350 17 G PO PACK
17.0000 g | PACK | Freq: Two times a day (BID) | ORAL | Status: DC
  Administered 2019-06-23 – 2019-06-24 (×3): 17 g via ORAL
  Filled 2019-06-23 (×3): qty 1

## 2019-06-23 MED ORDER — LEVETIRACETAM ER 500 MG PO TB24: 500.0000 mg | ORAL_TABLET | Freq: Every day | ORAL | 0 refills | Status: DC

## 2019-06-23 MED ORDER — POLYETHYLENE GLYCOL 3350 17 G PO PACK: 17.0000 g | PACK | Freq: Two times a day (BID) | ORAL | 0 refills | Status: DC

## 2019-06-23 MED ORDER — ACETAMINOPHEN 325 MG PO TABS: 650.0000 mg | ORAL_TABLET | ORAL | Status: DC | PRN

## 2019-06-23 MED ORDER — EZETIMIBE 10 MG PO TABS: 10.0000 mg | ORAL_TABLET | Freq: Every day | ORAL | 0 refills | Status: DC

## 2019-06-23 NOTE — Progress Notes (Signed)
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 Pt demonstrated recall of 3 out 4 medication (one medication was d/c) and given given verbal cues for association phrase 4 out 4 medication names. SLP facilitated semi-complex problem solving, error awareness and use of recall strategies in calendar organization task, pt demonstrated appropriate problem solving skills, and supervision A verbal cues for error awareness suggest due to memory deficits. Pt was unable to recall meaning of abberviation. SLP encouraged pt to utilize a key system when writing abberviations or write detailed information, pt stated agreement. SLP provided education on memory strategies giving handout, use of daily memory notebook and strategies such as WRAP, pt and pt's wife stated agreement. All questions answered to satisfaction. Pt was left in room with wife in room. ST recommends to continue skilled ST services.    Patient has met 5 of 5 long term goals.  Patient to discharge at overall Supervision;Min level.  Reasons goals not met:     Clinical Impression/Discharge Summary:   Pt demonstrated great progress meeting 5 out 5 goals, discharging at supervision A for word finding secondary likely to memory deficits and min A for cognitive skills. Pt demonstrated ability to preform basic to semi-complex problem solving tasks (medication management and calendar organization) with strategies focused on primary impairment in short term recall (memory notebook and WRAP strategies.) Pt demonstrates difficulty following multi-step directions and word finding again secondary to memory deficits. Pt demonstrated ability to utilize memory strategies in  structured setting and education was provided to pt and pt's wife pertaining to effective memory strategies to utilize at home. SLP suggest outpatient services focus on complex problem solving and use of memory strategies. Pt benefited from skilled ST services in order to maximize functional independence and reduce burden of care, requiring 24 hour supervision and continue ST services. Care Partner:  Caregiver Able to Provide Assistance: Yes  Type of Caregiver Assistance: Physical;Cognitive  Recommendation:  24 hour supervision/assistance;Outpatient SLP  Rationale for SLP Follow Up: Maximize cognitive function and independence;Reduce caregiver burden;Maximize functional communication   Equipment: N/A   Reasons for discharge: Discharged from hospital   Patient/Family Agrees with Progress Made and Goals Achieved: Yes(wife)    Yemariam Magar  [facility] 06/23/2019, 12:25 PM

## 2019-06-23 NOTE — Progress Notes (Signed)
[facility] PHYSICAL MEDICINE & REHABILITATION PROGRESS NOTE  Subjective/Complaints: Patient seen sitting up in his chair this morning.  He states he slept well overnight.  He is slow to process, but appears to be aware of plan of discharge tomorrow.  ROS: Denies CP, SOB, N/V/D  Objective: Vital Signs: Blood pressure 139/87, pulse (!) 55, temperature 98.2 F (36.8 C), resp. rate 18, height 5\' 9"  (1.753 m), weight 70.8 kg, SpO2 99 %. No results found. Recent Labs    06/21/19 0751  WBC 5.3  HGB 15.0  HCT 44.3  PLT 266   Recent Labs    06/21/19 0751  NA 134*  K 4.4  CL 100  CO2 23  GLUCOSE 94  BUN 12  CREATININE 1.05  CALCIUM 9.5    Physical Exam: BP 139/87 (BP Location: Left Arm)   Pulse (!) 55   Temp 98.2 F (36.8 C)   Resp 18   Ht 5\' 9"  (1.753 m)   Wt 70.8 kg   SpO2 99%   BMI 23.04 kg/m  Constitutional: No distress . Vital signs reviewed. HENT: Normocephalic.  Atraumatic. Eyes: EOMI. No discharge. Cardiovascular: No JVD. Respiratory: Normal effort.  No stridor. GI: Non-distended. Skin: Warm and dry.  Intact. Psych: Flat. Musc: No edema in extremities.  No tenderness in extremities. Neurological: Alert and oriented x3 Word finding difficulties however improving Slow Motor: Grossly 4+/5 throughout, unchanged  Assessment/Plan: 1. Functional deficits secondary to encephalopathy which require 3+ hours per day of interdisciplinary therapy in a comprehensive inpatient rehab setting.  Physiatrist is providing close team supervision and 24 hour management of active medical problems listed below.  Physiatrist and rehab team continue to assess barriers to discharge/monitor patient progress toward functional and medical goals  Care Tool:  Bathing    Body parts bathed by patient: Right arm, Left arm, Chest, Abdomen, Front perineal area, Right upper leg, Buttocks, Left upper leg, Right lower leg, Left lower leg, Face         Bathing assist Assist Level:  Supervision/Verbal cueing     Upper Body Dressing/Undressing Upper body dressing   What is the patient wearing?: Pull over shirt    Upper body assist Assist Level: Set up assist    Lower Body Dressing/Undressing Lower body dressing      What is the patient wearing?: Pants, Underwear/pull up     Lower body assist Assist for lower body dressing: Supervision/Verbal cueing     Toileting Toileting    Toileting assist Assist for toileting: Contact Guard/Touching assist     Transfers Chair/bed transfer  Transfers assist     Chair/bed transfer assist level: Supervision/Verbal cueing     Locomotion Ambulation   Ambulation assist      Assist level: Supervision/Verbal cueing Assistive device: No Device(Simultaneous filing. User may not have seen previous data.) Max distance: >542ft(Simultaneous filing. User may not have seen previous data.)   Walk 10 feet activity   Assist     Assist level: Supervision/Verbal cueing(Simultaneous filing. User may not have seen previous data.) Assistive device: No Device   Walk 50 feet activity   Assist    Assist level: Supervision/Verbal cueing(Simultaneous filing. User may not have seen previous data.) Assistive device: No Device    Walk 150 feet activity   Assist    Assist level: Supervision/Verbal cueing(Simultaneous filing. User may not have seen previous data.) Assistive device: No Device    Walk 10 feet on uneven surface  activity   Assist Walk 10 feet on  uneven surfaces activity did not occur: Safety/medical concerns   Assist level: Supervision/Verbal cueing(Simultaneous filing. User may not have seen previous data.)     Wheelchair     Assist Will patient use wheelchair at discharge?: No Type of Wheelchair: Manual    Wheelchair assist level: Contact Guard/Touching assist Max wheelchair distance: 125    Wheelchair 50 feet with 2 turns activity    Assist        Assist Level: Contact  Guard/Touching assist   Wheelchair 150 feet activity     Assist Wheelchair 150 feet activity did not occur: Safety/medical concerns          Medical Problem List and Plan: 1.Gait dysfunction with speech difficultysecondary tocomplicated seizure with encephalopathy as well as incidental findings of 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  Cont CIR  Team conference today to discuss current and goals and coordination of care, home and environmental barriers, and discharge planning with nursing, case manager, and therapies.  2. Antithrombotics: -DVT/anticoagulation:SCDs. -antiplatelet therapy: Aspirin 81 mg daily and Plavix 75 mg daily x3 weeks then aspirin alone 3. Pain Management:Tylenol as needed 4. Mood:Provide emotional support   Seroquel 12.5 nightly started on 10/18 with benefit, d/ced per wife preference -antipsychotic agents: N/A 5. Neuropsych: This patientisnot fully capable of making decisions on hisown behalf.  Cont telesitter for safety 6. Skin/Wound Care:Routine skin checks 7. Fluids/Electrolytes/Nutrition:Routine in and outs 8. Seizure: Keppra XR500 mg daily initiated  No seizures from admission to rehab to 10/21 9.Hyperlipidemia. [place]et[place] 10. History of prostate cancer 2008 status post prostatectomy as well as radiation therapy. Receives Lupron injection 30 mg every 6 months 11.OSA.Patient had been on CPAP at home.   12.  Labile blood pressure  Controlled on 10/21 13.  Bradycardia  Labile, but asymptomatic on 10/21  Continue to monitor with increased mobility 14.  Prediabetes  Monitor with increased mobility 15.  Hyponatremia  Sodium 134 on 10/19, continue to follow as outpatient 16.  Sleep disturbance  Trazodone replaced with seroquel-DC per wife insistence   Improving overall 17.  Constipation  Required disimpaction on 10/20, bowel meds increased on  10/21  LOS: 5 days A FACE TO FACE EVALUATION WAS PERFORMED  Andres Escandon Lorie Phenix 06/23/2019, 12:22 PM

## 2019-06-23 NOTE — Progress Notes (Signed)
Social Work Patient ID: Spencer Cree, MD, male   DOB: [birth year], 78 y.o.   MRN: [id number]  Met with pt and wife to discuss team conference goals of supervision level and discharge tomorrow. Both feel he is doing much better and wife has no concerns about providing the care at home. Have made referral to OP neuro therapies and will see in am for any last minute questions. Wife here to attend therapies all day with pt.

## 2019-06-23 NOTE — Patient Care Conference (Signed)
Inpatient RehabilitationTeam Conference and Plan of Care Update Date: 06/23/2019   Time: 10:50 AM    Patient Name: Spencer Nicolosi, MD      Medical Record Number: [id number]  Date of Birth: Mar 22, 1941 Sex: Male         Room/Bed: 4W18C/4W18C-01 Payor Info: Payor: MEDICARE / Plan: MEDICARE PART A AND B / Product Type: *No Product type* /    Admit Date/Time:  06/18/2019  4:24 PM  Primary Diagnosis:  Encephalopathy  Patient Active Problem List   Diagnosis Date Noted  . Slow transit constipation   . Impulsive   . Sleep disturbance   . Prediabetes   . Bradycardia   . Labile blood pressure   . Seizures ([place])   . Hyponatremia 06/18/2019  . Agitation 06/18/2019  . Encephalopathy 06/18/2019  . 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 Sleep apnea, central 03/26/2019  . Transient ischemic attack (TIA) 01/26/2019  . Transient diplopia 12/20/2018  . Dysesthesia affecting both sides of body 12/20/2018  . 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 Snoring 12/20/2018  . Nocturia more than twice per night 12/20/2018  . Insomnia disorder, with other sleep disorder, recurrent 12/20/2018  . Arthritis   . Cancer ([place])   . Elevated troponin   . Syncope 12/26/2016  . Family history of early CAD 12/26/2016  . Hyperlipidemia 08/15/2011  . PERSONAL HISTORY MALIGNANT NEOPLASM PROSTATE 07/11/2010    Expected Discharge Date: Expected Discharge Date: 06/24/19  Team Members Present: Physician leading conference: Dr. Delice Lesch Social Worker Present: Ovidio Kin, LCSW Nurse Present: Mohammed Kindle, RN PT Present: Barrie Folk, PT OT Present: Darleen Crocker, OT SLP Present: Charolett Bumpers, SLP PPS Coordinator present : Gunnar Fusi, SLP     Current Status/Progress Goal Weekly Team Focus  Bowel/Bladder   Continent x2  remain  continent  assess toileting needs qshift and PRN   Swallow/Nutrition/ Hydration             ADL's   supervisional overall for self care and functional mobility/transfers  supervision overall  d/c planning and hands on family education   Mobility   supervision overall, gait, stairs, transfers  supervision - at goal level  family edu   Communication   Supervision A (mainly due to cognitive deficits)  Supervision A - goal met  word finding strategies and education   Safety/Cognition/ Behavioral Observations  Min A  Min A - goal met  semi-complex to basic problem solving, error awareness, recall strategies and education   Pain   no c/o pain  pain less than 2  assess pain qshift and PRN   Skin   no skin impairments  remain free of skin impairments  assess skin qshift and PRN    Rehab Goals Patient on target to meet rehab goals: Yes *See Care Plan and progress notes for long and short-term goals.     Barriers to Discharge  Current Status/Progress Possible Resolutions Date Resolved   Nursing                  PT                    OT                  SLP  SW                Discharge Planning/Teaching Needs:  Home with wife who has been here and attended therapies with husband. Aware of his need for 24 hr supervision at DC. Will complete education today  family education with wife today   Team Discussion: Stable.  Continent of B/B, disimpacted yesterday.  OT S goal level, cuing for safety.  PT S overall did stairs, car transfers, cues for safety.  SLP memory issues, min to supervision, wife more comfortable with progress.  Fam ed completed.   Revisions to Treatment Plan:  N/A    Medical Summary Current Status: Gait dysfunction with speech difficulty secondary to complicated seizure with encephalopathy as well as incidental findings of small left middle cerebral artery branch abnormality likely embolic etiology, pt status post TPA. Weekly Focus/Goal: Improve  mobility, sleep, bradycardia, hyponatremia, cognition  Barriers to Discharge: Medical stability   Possible Resolutions to Barriers: Therapies, optimize sleep, follow HR, follow labs, will need supervision   Continued Need for Acute Rehabilitation Level of Care: The patient requires daily medical management by a physician with specialized training in physical medicine and rehabilitation for the following reasons: Direction of a multidisciplinary physical rehabilitation program to maximize functional independence : Yes Medical management of patient stability for increased activity during participation in an intensive rehabilitation regime.: Yes Analysis of laboratory values and/or radiology reports with any subsequent need for medication adjustment and/or medical intervention. : Yes   I attest that I was present, lead the team conference, and concur with the assessment and plan of the team.   Retta Diones 06/23/2019, 7:41 PM  Team conference was held via web/ teleconference due to [place] - 19

## 2019-06-23 NOTE — Progress Notes (Signed)
Pt refusing cpap for the night. RT will continue to monitor as needed. 

## 2019-06-23 NOTE — Progress Notes (Signed)
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  Patient to discharge at overall Supervision level.  Patient's care partner is independent to provide the necessary physical and cognitive assistance at discharge.    Reasons goals not met: all goals met  Recommendation:  No follow up OT intervention needed at this time  Equipment: No equipment provided  Reasons for discharge: treatment goals met  Patient/family agrees with progress made and goals achieved: Yes   OT Intervention: Upon entering the room, pt seated in recliner chair with wife present in room for continued education. Pt completed trail making test parts A and B this session. Pt able to follow directions given for test without cuing. Pt did have difficulty placing date on his test and able to utilize calendar with min cuing to do so. Pt with score of 77 seconds for part A with the average person finishing at 29 seconds but most people complete by 90 seconds. Part B completed within 2 minutes and 4 seconds with most people finishing under 3 minutes and an average of 90 seconds. Pt engaged in dual task of ambulation with supervision while throwing ball and naming animals in alphabetical order. Pt able to correct keep up with order and able to name some letters he passed on during the first round. Pt with no LOB with this task but concentrating so hard he was actually started when his name was called by therapist. Pt ambulating back to room with supervision and min cuing for navigation to utilize signage to locate. Pt and caregiver with no  further concerns at this time.   OT Discharge Precautions/Restrictions  Precautions Precautions: Fall Vital Signs Therapy Vitals Temp: 97.8 F (36.6 C) Temp Source: Oral Pulse Rate: 62 Resp: 15 BP: 122/70 Patient Position (if appropriate): Sitting Oxygen Therapy SpO2: 97 % O2 Device: Room Air Pain Pain Assessment Pain Score: 0-No pain Vision Baseline Vision/History: Wears glasses Wears Glasses: At all times Patient Visual Report: No change from baseline Vision Assessment?: No apparent visual deficits Cognition Overall Cognitive Status: Impaired/Different from baseline Arousal/Alertness: Awake/alert Orientation Level: Oriented X4 Attention: Selective Selective Attention: Appears intact Memory: Impaired Memory Impairment: Decreased recall of new information;Retrieval deficit Awareness: Impaired Awareness Impairment: Emergent impairment Problem Solving: Impaired Problem Solving Impairment: Verbal complex;Functional complex Sensation Sensation Light Touch: Appears Intact Hot/Cold: Appears Intact Proprioception: Appears Intact Coordination Gross Motor Movements are Fluid and Coordinated: Yes Fine Motor Movements are Fluid and Coordinated: Yes Mobility  Bed Mobility Bed Mobility: Rolling Right;Rolling Left;Supine to Sit;Sit to Supine Rolling Right: Independent Rolling Left: Independent Supine to Sit: Independent Sit to Supine: Independent Transfers Sit to Stand: Independent Stand to Sit: Independent  Trunk/Postural Assessment  Cervical Assessment Cervical Assessment: Within Functional Limits Thoracic Assessment Thoracic Assessment: Within Functional Limits Lumbar Assessment Lumbar Assessment: Within Functional Limits Postural Control Postural Control: Within Functional Limits  Balance Balance Balance Assessed: Yes Standardized Balance Assessment Standardized Balance Assessment: Berg Balance Test Berg Balance Test Sit to Stand: Able to stand without  using hands and stabilize independently Standing Unsupported: Able to stand safely 2 minutes Sitting with Back Unsupported but Feet Supported on Floor or Stool: Able to sit safely and  securely 2 minutes Stand to Sit: Sits safely with minimal use of hands Transfers: Able to transfer safely, minor use of hands Standing Unsupported with Eyes Closed: Able to stand 10 seconds safely Standing Ubsupported with Feet Together: Able to place feet together independently and stand 1 minute safely From Standing, Reach Forward with Outstretched Arm: Can reach forward >12 cm safely (5") From Standing Position, Pick up Object from Floor: Able to pick up shoe, needs supervision From Standing Position, Turn to Look Behind Over each Shoulder: Looks behind one side only/other side shows less weight shift Turn 360 Degrees: Able to turn 360 degrees safely one side only in 4 seconds or less Standing Unsupported, Alternately Place Feet on Step/Stool: Able to stand independently and complete 8 steps >20 seconds Standing Unsupported, One Foot in Front: Able to place foot tandem independently and hold 30 seconds Standing on One Leg: Able to lift leg independently and hold equal to or more than 3 seconds Total Score: 49 Static Sitting Balance Static Sitting - Balance Support: Feet supported Static Sitting - Level of Assistance: 6: Modified independent (Device/Increase time) Dynamic Sitting Balance Dynamic Sitting - Balance Support: Feet supported Dynamic Sitting - Level of Assistance: 6: Modified independent (Device/Increase time) Static Standing Balance Static Standing - Balance Support: During functional activity Static Standing - Level of Assistance: 6: Modified independent (Device/Increase time) Dynamic Standing Balance Dynamic Standing - Balance Support: During functional activity Dynamic Standing - Level of Assistance: 6: Modified independent (Device/Increase time) Extremity/Trunk Assessment RUE  Assessment RUE Assessment: Within Functional Limits LUE Assessment LUE Assessment: Within Functional Limits   Gypsy Decant 06/23/2019, 4:34 PM

## 2019-06-23 NOTE — Discharge Instructions (Signed)
Inpatient Rehab Discharge Instructions  Deeann Cree, MD Discharge date and time: No discharge date for patient encounter.   Activities/Precautions/ Functional Status: Activity: activity as tolerated Diet: regular diet Wound Care: none needed Functional status:  ___ No restrictions     ___ Walk up steps independently ___ 24/7 supervision/assistance   ___ Walk up steps with assistance ___ Intermittent supervision/assistance  ___ Bathe/dress independently ___ Walk with walker     _x__ Bathe/dress with assistance ___ Walk Independently    ___ Shower independently ___ Walk with assistance    ___ Shower with assistance ___ No alcohol     ___ Return to work/school ________  Special Instructions: No driving smoking or alcohol  Continue CPAP  Continue aspirin and Plavix x3 weeks then aspirin alone    COMMUNITY REFERRALS UPON DISCHARGE:    Outpatient: PT & SP   Agency:[facility] [phone]   Date of Last Service:06/24/2019  Appointment Date/Time:OCTOBER 26 10:00-11:45 AM    STROKE/TIA DISCHARGE INSTRUCTIONS SMOKING Cigarette smoking nearly doubles your risk of having a stroke & is the single most alterable risk factor  If you smoke or have smoked in the last 12 months, you are advised to quit smoking for your health.  Most of the excess cardiovascular risk related to smoking disappears within a year of stopping.  Ask you doctor about anti-smoking medications  [place] Quit Line: 1-800-QUIT NOW  Free Smoking Cessation Classes [phone]  CHOLESTEROL Know your levels; limit fat & cholesterol in your diet  Lipid Panel     Component Value Date/Time   CHOL 172 06/16/2019 0505   TRIG 56 06/16/2019 0505   HDL 58 06/16/2019 0505   CHOLHDL 3.0 06/16/2019 0505   VLDL 11 06/16/2019 0505   LDLCALC 103 (H) 06/16/2019 0505      Many patients benefit from treatment even if their cholesterol is at goal.  Goal: Total Cholesterol (CHOL) less than  160  Goal:  Triglycerides (TRIG) less than 150  Goal:  HDL greater than 40  Goal:  LDL (LDLCALC) less than 100   BLOOD PRESSURE American Stroke Association blood pressure target is less that 120/80 mm/Hg  Your discharge blood pressure is:  BP: 124/80  Monitor your blood pressure  Limit your salt and alcohol intake  Many individuals will require more than one medication for high blood pressure  DIABETES (A1c is a blood sugar average for last 3 months) Goal HGBA1c is under 7% (HBGA1c is blood sugar average for last 3 months)  Diabetes: No known diagnosis of diabetes    Lab Results  Component Value Date   HGBA1C 5.8 (H) 06/16/2019     Your HGBA1c can be lowered with medications, healthy diet, and exercise.  Check your blood sugar as directed by your physician  Call your physician if you experience unexplained or low blood sugars.  PHYSICAL ACTIVITY/REHABILITATION Goal is 30 minutes at least 4 days per week  Activity: Increase activity slowly, Therapies: Physical Therapy: Home Health Return to work:   Activity decreases your risk of heart attack and stroke and makes your heart stronger.  It helps control your weight and blood pressure; helps you relax and can improve your mood.  Participate in a regular exercise program.  Talk with your doctor about the best form of exercise for you (dancing, walking, swimming, cycling).  DIET/WEIGHT Goal is to maintain a healthy weight  Your discharge diet is:  Diet Order  Diet regular Room service appropriate? Yes; Fluid consistency: Thin  Diet effective now              liquids Your height is:  Height: 5\' 9"  (175.3 cm) Your current weight is: Weight: 70.8 kg Your Body Mass Index (BMI) is:  BMI (Calculated): 23.03  Following the type of diet specifically designed for you will help prevent another stroke.  Your goal weight range is:    Your goal Body Mass Index (BMI) is 19-24.  Healthy food habits can help reduce 3 risk  factors for stroke:  High cholesterol, hypertension, and excess weight.  RESOURCES Stroke/Support Group:  Call [phone]   STROKE EDUCATION PROVIDED/REVIEWED AND GIVEN TO PATIENT Stroke warning signs and symptoms How to activate emergency medical system (call 911). Medications prescribed at discharge. Need for follow-up after discharge. Personal risk factors for stroke. Pneumonia vaccine given:  Flu vaccine given:  My questions have been answered, the writing is legible, and I understand these instructions.  I will adhere to these goals & educational materials that have been provided to me after my discharge from the hospital.      My questions have been answered and I understand these instructions. I will adhere to these goals and the provided educational materials after my discharge from the hospital.  Patient/Caregiver Signature _______________________________ Date __________  Clinician Signature _______________________________________ Date __________  Please bring this form and your medication list with you to all your follow-up doctor's appointments.

## 2019-06-23 NOTE — Progress Notes (Signed)
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  Patient to discharge at an ambulatory level Supervision.   Patient's care partner is independent to provide the necessary cognitive assistance at discharge.  Reasons goals not met: N/A, all goals met at D/C   Recommendation:  Patient will benefit from ongoing skilled PT services in outpatient setting to continue to advance safe functional mobility, address ongoing impairments in balance and coordination, and minimize fall risk.  Equipment: No equipment provided  Reasons for discharge: treatment goals met and discharge from hospital  Patient/family agrees with progress made and goals achieved: Yes  PT Discharge SPT completed discharge session and implemented treatment intervention. See below for details. Pt received upright in recliner agreeable to treatment. Wife present for therapy session. Pt completed the 6MWT and ambulated 1,905 feet which is above the norm for the pt's age group. Pt also completed Berg Balance Test and scored [phone] which is an improvement from eval (41/56) which indicates that pt is a medium fall risk. Pt completed car transfer twice with supervision. Pt and wife educated on HEP including [place] A Exercises in addition to core and periscapular exercises to increase overall postural control. Pt completed 1x10 of each of the exercises in the [place] A program. Pt returned to room at the end of the session.   Precautions/Restrictions   Pain Pain Assessment Pain Score: 0-No pain Vision/Perception  Pt wear glasses all of the time, no changes from baseline     Cognition Overall Cognitive Status: Impaired/Different from baseline Arousal/Alertness:  Awake/alert Orientation Level: Oriented X4 Attention: Selective Selective Attention: Appears intact Memory: Impaired Memory Impairment: Decreased recall of new information;Retrieval deficit Awareness: Impaired Awareness Impairment: Emergent impairment Problem Solving: Impaired Problem Solving Impairment: Verbal complex;Functional complex Safety/Judgment: Appears intact Sensation Sensation Light Touch: Appears Intact Proprioception: Appears Intact Coordination Gross Motor Movements are Fluid and Coordinated: Yes Fine Motor Movements are Fluid and Coordinated: Yes Motor  Motor Motor: Motor apraxia Motor - Skilled Clinical Observations: improved safety awareness and sequencing  Mobility Bed Mobility Bed Mobility: Rolling Right;Rolling Left;Supine to Sit;Sit to Supine Rolling Right: Independent Rolling Left: Independent Supine to Sit: Independent Sit to Supine: Independent Transfers Transfers: Sit to Stand;Stand to Sit;Stand Pivot Transfers Sit to Stand: Independent Stand to Sit: Independent Stand Pivot Transfers: Independent Stand Pivot Transfer Details (indicate cue type and reason): independent Transfer (Assistive device): None Locomotion  Gait Ambulation: Yes Gait Assistance: Supervision/Verbal cueing Gait Distance (Feet): 1905 Feet Assistive device: None Gait Assistance Details: Verbal cues for precautions/safety Gait Gait: Yes Gait Pattern: Within Functional Limits Gait velocity: increased Stairs / Additional Locomotion Stairs: Yes Stairs Assistance: Supervision/Verbal cueing Stair Management Technique: One rail Left Number of Stairs: 12 Height of Stairs: 4 Ramp: Supervision/Verbal cueing Curb: Supervision/Verbal cueing Wheelchair Mobility Wheelchair Mobility: No  Trunk/Postural Assessment  Cervical Assessment Cervical Assessment: Within Functional Limits Thoracic Assessment Thoracic Assessment: Within Functional Limits Lumbar Assessment Lumbar  Assessment: Within Functional Limits Postural Control Postural Control: Within Functional Limits  Balance Balance Balance Assessed: Yes Standardized Balance Assessment Standardized Balance Assessment: Berg Balance Test Berg Balance Test Sit to Stand: Able to stand without using hands and stabilize independently Standing Unsupported: Able to stand safely 2 minutes Sitting with Back Unsupported but Feet Supported on Floor or  Stool: Able to sit safely and securely 2 minutes Stand to Sit: Sits safely with minimal use of hands Transfers: Able to transfer safely, minor use of hands Standing Unsupported with Eyes Closed: Able to stand 10 seconds safely Standing Ubsupported with Feet Together: Able to place feet together independently and stand 1 minute safely From Standing, Reach Forward with Outstretched Arm: Can reach forward >12 cm safely (5") From Standing Position, Pick up Object from Floor: Able to pick up shoe, needs supervision From Standing Position, Turn to Look Behind Over each Shoulder: Looks behind one side only/other side shows less weight shift Turn 360 Degrees: Able to turn 360 degrees safely one side only in 4 seconds or less Standing Unsupported, Alternately Place Feet on Step/Stool: Able to stand independently and complete 8 steps >20 seconds Standing Unsupported, One Foot in Front: Able to place foot tandem independently and hold 30 seconds Standing on One Leg: Able to lift leg independently and hold equal to or more than 3 seconds Total Score: 49 Static Sitting Balance Static Sitting - Balance Support: Feet supported Static Sitting - Level of Assistance: 6: Modified independent (Device/Increase time) Dynamic Sitting Balance Dynamic Sitting - Balance Support: Feet supported Dynamic Sitting - Level of Assistance: 6: Modified independent (Device/Increase time) Static Standing Balance Static Standing - Balance Support: During functional activity Static Standing - Level of  Assistance: 6: Modified independent (Device/Increase time) Dynamic Standing Balance Dynamic Standing - Balance Support: During functional activity Dynamic Standing - Level of Assistance: 6: Modified independent (Device/Increase time) Extremity Assessment      RLE Assessment General Strength Comments: grossly WNL LLE Assessment General Strength Comments: grossly WNL    Olena Leatherwood, SPT  06/23/2019, 3:33 PM

## 2019-06-23 NOTE — Consult Note (Addendum)
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       Clinical Neuropsychologist       Billing Code/Service: [id number]  Chief Complaint:    Spencer Underwood is a 78 year old male with a history of hyperlipidemia, prostate cancer 2008 with prostatectomy as well as radiation therapy and Lupron injections every 6 months, CAD maintained on low-dose aspirin and syncope.  The patient presented on 06/15/2019 with altered mental status and speech difficulty.  According to the wife the patient had progressive neurological issues for the last several months and has been followed outpatient neurologically by Dr. Brett Fairy with previous CT scans, MRI and PET scans.  The patient had a neuropsychological test battery that was interpreted as suggesting posterior cortical atrophy variant although the presentation of some of the symptoms appear to differentiate from this particular diagnostic picture.  The patient's initial primary symptom was geographic disorientation but it appeared to be more related to classic geographic disorientation versus disturbance of primary vision with normal eye functioning.  The patient has clearly developed other symptoms along the way including frontal lobe type symptoms such as changes in personality styles along with memory difficulties, impulsivity, confusion and executive functioning problems.  There is been a fairly precipitous change over the past couple of months as the patient was able to successfully travel to [place] with his wife at the beginning of this year but began having clear symptoms after the  completion of a 30-hour flight back to [place].  MRI/CT have both identified atrophy and indications of chronic small vessel ischemic changes of the white matter.  EEG conducted on 06/17/2019 completed suggestive of cortical dysfunction in the left frontotemporal regions nonspecific etiology with no seizure activity seen.  The 1015 EEG was improved over the 10/14 EEG which showed significant frontotemporal slowing and it is now more intermittent and there was a rule out regarding the patient being in a postictal state.  He was placed on Keppra for seizure prophylaxis.  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  The patient and his wife are continuing to look at further diagnostic considerations has no definitive diagnosis has been made.  Differential diagnoses at this point include various variants of Alzheimer's dementia versus other cortically mediated degenerative dementias, localized partial seizure activity, significant impact and worsening of microvascular ischemic changes in white matter disease.  Reason for Service:  The patient was referred for neuropsychological consultation due to coping and adjustment as well as looking preliminary at overall diagnostic concerns.  Below is the HPI for the current admission.  [id number] Spencer Underwood is a 78 year old right-handed male with history of hyperlipidemia, prostate cancer 2008 with prostatectomy as well as radiation therapy and Lupron injections every 6 months, CAD maintained on low-dose aspirin and syncope. Per chart review patient lives with spouse. 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 Per report  and discussion with wife patient has had progressive neurological issues for the last  several months and has been seen outpatient neurology by Dr. Brett Fairy with several CT scans of the head showing no definite etiology. No history of documented seizure or seizure-like episodes. He had a PET scan done 04/06/2019 which did not suggest any diminished bilateral cortical metabolism to raise concern about Alzheimer's. Patient has had neuropsychological test battery recently which suggested posterior cortical atrophy variant. His last Mini-Mental status exam was 25 out of 30 on 06/08/2019 with his visit to neurology services Dr. Brett Fairy. Latest chemistries upon admission with sodium 130, COVID negative, hemoglobin 13.6, WBC 5.1. Cranial CT scan 06/15/2019 showed no acute findings by CT. Noted atrophy and chronic small vessel ischemic changes of the white matter. CT angiogram of head and neck with no large or medium vessel occlusion. MRI did not identify any acute intracranial abnormality or significant interval change. Patient receive TPA. Echocardiogram with ejection fraction of 65% without emboli. EEG 06/17/2019 completed suggestive of cortical dysfunction in the left frontotemporal region nonspecific etiology no seizure activity seen. This EEG demonstrated improvement compared to 10/14 as frontotemporal slowing was now intermittent.--?post-ictal sate.Placed on Keppra for seizure prophylaxis.Neurology follow-up review of MRI shows incidental findings ofsmall left middle cerebral artery branch infarct likely of embolic etiologysuspect complicated seizure with encephalopathy. Currently maintained on aspirin 81 mg daily and Plavix 75 mg daily x3 weeks then aspirin alone. Patient is tolerating a regular consistency diet. There have been intermittent bouts of agitation and restlessness noted. Therapy evaluations completed and patient was admitted for a comprehensive rehab program.  Current Status:  The patient was able to carry on accurate conversations and did not indicate any  expressive language deficits.  Good word finding and articulation was noted.  The patient clearly had some recall and memory issues as well as some executive functioning deficits and impulsiveness.  The patient is having visual spatial/visual inattention type symptoms and there is very likely of parietal lobe involvement in his symptoms.  However, are also seen significant indications of frontotemporal involvement as well in the range of deficits would not generally be consistent with the effects of the degree of microvascular ischemic changes in white matter regions and small vessel disease or lingering effects of incidental chronic small embolic stroke findings.  This does, however, appear to be progressive condition and it is continuing to show worsening of symptoms.  However, he has just been placed on Keppra recently and it may take some time to see if seizure involvement in his symptoms are present and if Keppra will improve symptoms over time with suppression of any seizure activity if present.  The patient and his wife both describe at least a couple of instances where the patient has had nondelusional type visual hallucinations.  He is at times where he envisioned and saw yarn in his hand and was working as well as seen other types of brief visual hallucinations.  However, the patient has not had any tremors or motor deficits.  Gait was steady and well coordinated.  He was slow in his walking but stable gait.  Behavioral Observation: Deeann Cree, MD  presents as a 78 y.o.-year-old Right Caucasian Male who appeared his stated age. his dress was Appropriate and he was Well Groomed and his manners were Appropriate to the situation.  his participation was indicative of Appropriate, Inattentive and Redirectable behaviors.  There were any physical disabilities noted.  he displayed an appropriate level of cooperation and motivation.  Interactions:    Active Appropriate, Inattentive and  Redirectable  Attention:   abnormal and attention span appeared shorter than expected for age  Memory:   abnormal; remote memory intact, recent memory impaired  Visuo-spatial:  abnormal some of the patient's first symptoms were of significant visual spatial visual processing deficits.  The patient had significant geographic disorientation symptoms early on.  Speech (Volume):  low  Speech:   No significant circumlocutions of paraphasic errors were noted.; normal  Thought Process:  Relevant and Circumstantial  Though Content:  WNL; not suicidal and not homicidal  Orientation:   person and place  Judgment:   Fair  Planning:   Poor  Affect:    Blunted  Mood:    Dysphoric  Insight:   Fair  Intelligence:   very high  Medical History:   Past Medical History:  Diagnosis Date  . Allergy   . Arthritis   . Cancer ([facility])   . Cataract   . Chronic kidney disease    atrophic left kidney  . Coronary atherosclerosis    a. 2012 - incidentally noted on CT scan of chest;  b. 08/2011 Myoview: normal; c. 03/2015 CPX @ [facility]: normal w/ PAC's, PVC's, and a 3 beat run of NSVT in recovery.  Marland Kitchen Heart murmur    asymptomatic function murmur.  Marland Kitchen History of echocardiogram    a. 12/2004 Echo: Nl EF, trace TR/MR.  Marland Kitchen Hx of radiation therapy   . Hyperlipidemia   . Personal history of malignant neoplasm of prostate    a. 2008 s/p prostatecomtomy-->radiation-->currently on Lupron injections.  . Sinus bradycardia   . Syncope    a. 12/2016   Psychiatric History:  Patient has no prior psychiatric history.  Family Med/Psych History:  Family History  Problem Relation Age of Onset  . Hypertension Mother   . Thyroid disease Mother   . Stroke Father   . Coronary artery disease Father   . Colon cancer Neg Hx   . Esophageal cancer Neg Hx   . Pancreatic cancer Neg Hx   . Rectal cancer Neg Hx   . Stomach cancer Neg Hx     Impression/DX:  Spencer Underwood is a 78 year old male with a history of  hyperlipidemia, prostate cancer 2008 with prostatectomy as well as radiation therapy and Lupron injections every 6 months, CAD maintained on low-dose aspirin and syncope.  The patient presented on 06/15/2019 with altered mental status and speech difficulty.  According to the wife the patient had progressive neurological issues for the last several months and has been followed outpatient neurologically by Dr. Brett Fairy with previous CT scans, MRI and PET scans.  The patient had a neuropsychological test battery that was interpreted as suggesting posterior cortical atrophy variant although the presentation of some of the symptoms appear to differentiate from this particular diagnostic picture.  The patient's initial primary symptom was geographic disorientation but it appeared to be more related to classic geographic disorientation versus disturbance of primary vision with normal eye functioning.  The patient has clearly developed other symptoms along the way including frontal lobe type symptoms such as changes in personality styles along with memory difficulties, impulsivity, confusion and executive functioning problems.  There is been a fairly precipitous change over the past couple of months as the patient was able to successfully travel to [place] with his wife at the beginning of this year but began having clear symptoms after the completion of a 30-hour flight back to [place].  MRI/CT have  both identified atrophy and indications of chronic small vessel ischemic changes of the white matter.  EEG conducted on 06/17/2019 completed suggestive of cortical dysfunction in the left frontotemporal regions nonspecific etiology with no seizure activity seen.  The 1015 EEG was improved over the 10/14 EEG which showed significant frontotemporal slowing and it is now more intermittent and there was a rule out regarding the patient being in a postictal state.  He was placed on Keppra for seizure prophylaxis.  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  The patient and his wife are continuing to look at further diagnostic considerations has no definitive diagnosis has been made.  Differential diagnoses at this point include various variants of Alzheimer's dementia versus other cortically mediated degenerative dementias, localized partial seizure activity, significant impact and worsening of microvascular ischemic changes in white matter disease.  The patient was able to carry on accurate conversations and did not indicate any expressive language deficits.  Good word finding and articulation was noted.  The patient clearly had some recall and memory issues as well as some executive functioning deficits and impulsiveness.  The patient is having visual spatial/visual inattention type symptoms and there is very likely of parietal lobe involvement in his symptoms.  However, are also seen significant indications of frontotemporal involvement as well in the range of deficits would not generally be consistent with the effects of the degree of microvascular ischemic changes in white matter regions and small vessel disease or lingering effects of incidental chronic small embolic stroke findings.  This does, however, appear to be progressive condition and it is continuing to show worsening of symptoms.  However, he has just been placed on Keppra recently and it may take some time to see if seizure involvement in his symptoms are present and if Keppra will improve symptoms over time with suppression of any seizure activity if present.  Disposition/Plan:  SRS diagnostic considerations, there are some complexities to his presentation.  However, it does appear that initially his first prominent symptom had to do with significant geographic disorientation that developed early on.   However, he has developed more frontal and temporal types of symptoms as well.  Essentially, and Alzheimer's type dementia would be the most fitting presentation although seizures may or may not be playing a role in some of his symptoms as well.  While not in his medical records available to me it does appear that prion disease has been considered and ruled out as the patient was in [place] during the time of [place]/prion disease were developed by individuals.   Diagnosis:    Encephalopathy - Plan: Ambulatory referral to Neurology, Ambulatory referral to Physical Medicine Rehab        Electronically Signed   _______________________ Ilean Skill, Psy.D.

## 2019-06-23 NOTE — Plan of Care (Signed)
  Problem: Consults Goal: RH STROKE PATIENT EDUCATION Description: See Patient Education module for education specifics  Outcome: Progressing Goal: Nutrition Consult-if indicated Outcome: Progressing   Problem: RH BOWEL ELIMINATION Goal: RH STG MANAGE BOWEL WITH ASSISTANCE Description: STG Manage Bowel with mod I Assistance. Outcome: Progressing   Problem: RH BLADDER ELIMINATION Goal: RH STG MANAGE BLADDER WITH ASSISTANCE Description: STG Manage Bladder With mod I Assistance Outcome: Progressing Goal: RH STG MANAGE BLADDER WITH EQUIPMENT WITH ASSISTANCE Description: STG Manage Bladder With Equipment With Mod I Assistance Outcome: Progressing   Problem: RH SAFETY Goal: RH STG ADHERE TO SAFETY PRECAUTIONS W/ASSISTANCE/DEVICE Description: STG Adhere to Safety Precautions With verbal cues and reminders Assistance/Device. Outcome: Progressing Goal: RH STG DECREASED RISK OF FALL WITH ASSISTANCE Description: STG Decreased Risk of Fall With verbal cues and reminders Assistance. Outcome: Progressing   Problem: RH KNOWLEDGE DEFICIT Goal: RH STG INCREASE KNOWLEGDE OF HYPERLIPIDEMIA Description:  Pt and wife will be able demonstrate understanding of dietary modifications to control cholesterol levels independently.  Outcome: Progressing Goal: RH STG INCREASE KNOWLEDGE OF STROKE PROPHYLAXIS Description:  Pt and wife will be able demonstrate understanding of dietary modifications to control blood pressure independently. Pt and wife will demonstrate understanding of precautions to take to prevent bleeding while pt is on blood thinners independently.  Outcome: Progressing

## 2019-06-24 NOTE — Progress Notes (Signed)
[facility] PHYSICAL MEDICINE & REHABILITATION PROGRESS NOTE  Subjective/Complaints: Patient seen laying in bed this morning.  He states he slept well overnight.  He is aware of discharge by describing what is to take place today.  He states he believes he has made progress.  ROS: Denies CP, SOB, N/V/D  Objective: Vital Signs: Blood pressure 134/73, pulse (!) 55, temperature 98.1 F (36.7 C), resp. rate 17, height 5\' 9"  (1.753 m), weight 70.8 kg, SpO2 99 %. No results found. No results for input(s): WBC, HGB, HCT, PLT in the last 72 hours. No results for input(s): NA, K, CL, CO2, GLUCOSE, BUN, CREATININE, CALCIUM in the last 72 hours.  Physical Exam: BP 134/73 (BP Location: Left Arm)   Pulse (!) 55   Temp 98.1 F (36.7 C)   Resp 17   Ht 5\' 9"  (1.753 m)   Wt 70.8 kg   SpO2 99%   BMI 23.04 kg/m  Constitutional: No distress . Vital signs reviewed. HENT: Normocephalic.  Atraumatic. Eyes: EOMI. No discharge. Cardiovascular: No JVD. Respiratory: Normal effort.  No stridor. GI: Non-distended. Skin: Warm and dry.  Intact. Psych: Flat. Musc: No edema in extremities.  No tenderness in extremities. Neurological: Alert and oriented x3 Word finding difficulties stable Slow Motor: Grossly 4+-5/5 throughout  Assessment/Plan: 1. Functional deficits secondary to encephalopathy which require 3+ hours per day of interdisciplinary therapy in a comprehensive inpatient rehab setting.  Physiatrist is providing close team supervision and 24 hour management of active medical problems listed below.  Physiatrist and rehab team continue to assess barriers to discharge/monitor patient progress toward functional and medical goals  Care Tool:  Bathing    Body parts bathed by patient: Right arm, Left arm, Chest, Abdomen, Front perineal area, Right upper leg, Buttocks, Left upper leg, Right lower leg, Left lower leg, Face         Bathing assist Assist Level: Supervision/Verbal cueing      Upper Body Dressing/Undressing Upper body dressing   What is the patient wearing?: Pull over shirt    Upper body assist Assist Level: Supervision/Verbal cueing    Lower Body Dressing/Undressing Lower body dressing      What is the patient wearing?: Pants, Underwear/pull up     Lower body assist Assist for lower body dressing: Supervision/Verbal cueing     Toileting Toileting    Toileting assist Assist for toileting: Supervision/Verbal cueing     Transfers Chair/bed transfer  Transfers assist     Chair/bed transfer assist level: Supervision/Verbal cueing     Locomotion Ambulation   Ambulation assist      Assist level: Supervision/Verbal cueing Assistive device: No Device Max distance: 1,905 feet   Walk 10 feet activity   Assist     Assist level: Supervision/Verbal cueing Assistive device: No Device   Walk 50 feet activity   Assist    Assist level: Supervision/Verbal cueing Assistive device: No Device    Walk 150 feet activity   Assist    Assist level: Supervision/Verbal cueing Assistive device: No Device    Walk 10 feet on uneven surface  activity   Assist Walk 10 feet on uneven surfaces activity did not occur: Safety/medical concerns   Assist level: Supervision/Verbal cueing     Wheelchair     Assist Will patient use wheelchair at discharge?: No Type of Wheelchair: Manual    Wheelchair assist level: Contact Guard/Touching assist Max wheelchair distance: 125    Wheelchair 50 feet with 2 turns activity    Assist  Assist Level: Contact Guard/Touching assist   Wheelchair 150 feet activity     Assist Wheelchair 150 feet activity did not occur: Safety/medical concerns          Medical Problem List and Plan: 1.Gait dysfunction with speech difficultysecondary tocomplicated seizure with encephalopathy as well as incidental findings of small left middle cerebral artery branchabnormalitylikely  embolic etiology, ptstatus post TPA.  DC today  Will see patient for transitional care management in 1-2 weeks post-discharge 2. Antithrombotics: -DVT/anticoagulation:SCDs. -antiplatelet therapy: Aspirin 81 mg daily and Plavix 75 mg daily x3 weeks then aspirin alone 3. Pain Management:Tylenol as needed 4. Mood:Provide emotional support   Seroquel 12.5 nightly started on 10/18 with benefit, d/ced per wife preference -antipsychotic agents: N/A 5. Neuropsych: This patientisnot fully capable of making decisions on hisown behalf. 6. Skin/Wound Care:Routine skin checks 7. Fluids/Electrolytes/Nutrition:Routine in and outs 8. Seizure: Keppra XR500 mg daily initiated  No seizures from admission to rehab to 10/22 9.Hyperlipidemia. Zet[place] 10. History of prostate cancer 2008 status post prostatectomy as well as radiation therapy. Receives Lupron injection 30 mg every 6 months 11.OSA.Patient had been on CPAP at home.   12.  Labile blood pressure  Controlled on 10/22 13.  Bradycardia  Slightly labile, but asymptomatic on 10/22  Continue to monitor with increased mobility 14.  Prediabetes  Monitor with increased mobility 15.  Hyponatremia  Sodium 134 on 10/19, continue to follow as outpatient 16.  Sleep disturbance  Trazodone replaced with seroquel-DC per wife insistence   Improved 17.  Constipation  Required disimpaction on 10/20, bowel meds increased on 10/21  LOS: 6 days A FACE TO FACE EVALUATION WAS PERFORMED  Juris Gosnell Lorie Phenix 06/24/2019, 10:04 AM

## 2019-06-24 NOTE — Progress Notes (Signed)
Pt discharged home with family. Discharge instructions given by Linna Hoff, PA. Belongings sent with pt. No further questions from pt or family. Gerald Stabs, RN

## 2019-06-24 NOTE — Progress Notes (Signed)
Social Work Discharge Note   The overall goal for the admission was met for:   Discharge location: Yes-HOME WITH WIFE WHO CAN PROVIDE 24 HR SUPERVISION  Length of Stay: Yes-6 DAYS  Discharge activity level: Yes-SUPERVISION LEVEL  Home/community participation: Yes  Services provided included: MD, RD, PT, OT, SLP, RN, CM, Pharmacy, Neuropsych and SW  Financial Services: Medicare and Private Insurance: [place]  Follow-up services arranged: Outpatient: [facility]-PT & SP-10/26 10:00-11:45 AM  Comments (or additional information):WIFE WAS HERE FOR MULTIPLE DAYS OF THERAPIES AND FEELS COMFORTABLE WITH HIS CARE OF SUPERVISION FOR SAFETY AND TO PROVIDE CUES  Patient/Family verbalized understanding of follow-up arrangements: Yes  Individual responsible for coordination of the follow-up plan: JANE-WIFE  Confirmed correct DME delivered: Elease Hashimoto 06/24/2019    Elease Hashimoto

## 2019-06-24 NOTE — Plan of Care (Signed)
  Problem: Consults Goal: RH STROKE PATIENT EDUCATION Description: See Patient Education module for education specifics  Outcome: Completed/Met Goal: Nutrition Consult-if indicated Outcome: Completed/Met   Problem: RH BOWEL ELIMINATION Goal: RH STG MANAGE BOWEL WITH ASSISTANCE Description: STG Manage Bowel with mod I Assistance. Outcome: Completed/Met   Problem: RH BLADDER ELIMINATION Goal: RH STG MANAGE BLADDER WITH ASSISTANCE Description: STG Manage Bladder With mod I Assistance Outcome: Completed/Met Goal: RH STG MANAGE BLADDER WITH EQUIPMENT WITH ASSISTANCE Description: STG Manage Bladder With Equipment With Mod I Assistance Outcome: Completed/Met   Problem: RH SAFETY Goal: RH STG ADHERE TO SAFETY PRECAUTIONS W/ASSISTANCE/DEVICE Description: STG Adhere to Safety Precautions With verbal cues and reminders Assistance/Device. Outcome: Completed/Met Goal: RH STG DECREASED RISK OF FALL WITH ASSISTANCE Description: STG Decreased Risk of Fall With verbal cues and reminders Assistance. Outcome: Completed/Met   Problem: RH KNOWLEDGE DEFICIT Goal: RH STG INCREASE KNOWLEGDE OF HYPERLIPIDEMIA Description:  Pt and wife will be able demonstrate understanding of dietary modifications to control cholesterol levels independently.  Outcome: Completed/Met Goal: RH STG INCREASE KNOWLEDGE OF STROKE PROPHYLAXIS Description:  Pt and wife will be able demonstrate understanding of dietary modifications to control blood pressure independently. Pt and wife will demonstrate understanding of precautions to take to prevent bleeding while pt is on blood thinners independently.  Outcome: Completed/Met

## 2019-06-28 ENCOUNTER — Telehealth: Payer: Self-pay | Admitting: Registered Nurse

## 2019-06-28 ENCOUNTER — Ambulatory Visit: Payer: Medicare Other

## 2019-06-28 ENCOUNTER — Encounter: Payer: Self-pay | Admitting: Physical Therapy

## 2019-06-28 ENCOUNTER — Ambulatory Visit: Payer: Medicare Other | Attending: Physical Medicine & Rehabilitation | Admitting: Physical Therapy

## 2019-06-28 ENCOUNTER — Other Ambulatory Visit: Payer: Self-pay

## 2019-06-28 DIAGNOSIS — R4701 Aphasia: Secondary | ICD-10-CM | POA: Diagnosis not present

## 2019-06-28 DIAGNOSIS — M6281 Muscle weakness (generalized): Secondary | ICD-10-CM | POA: Diagnosis not present

## 2019-06-28 DIAGNOSIS — R41841 Cognitive communication deficit: Secondary | ICD-10-CM | POA: Diagnosis not present

## 2019-06-28 DIAGNOSIS — R2689 Other abnormalities of gait and mobility: Secondary | ICD-10-CM | POA: Diagnosis not present

## 2019-06-28 DIAGNOSIS — H532 Diplopia: Secondary | ICD-10-CM | POA: Diagnosis not present

## 2019-06-28 DIAGNOSIS — H5310 Unspecified subjective visual disturbances: Secondary | ICD-10-CM | POA: Diagnosis not present

## 2019-06-28 NOTE — Telephone Encounter (Signed)
Spencer Underwood doesn't see the need for her husband Spencer Underwood to come for his  transitional care visit follow up  appointment. This provider explained the need and guidelines of the transitional care follow up appointment. Spencer Underwood  states Dr. Brett Fairy has been following her husband. I explained to Spencer Underwood I will speak with Dr. Posey Pronto and give her a call with his answer, she realizes the PT & Speech orders are coming from Dr. Dr. Posey Pronto. Will await Dr. Posey Pronto response, she verbalizes understanding.

## 2019-06-28 NOTE — Telephone Encounter (Signed)
Transitional Care call  Patient name: Spencer Underwood  DOB: March 12, 1941 1. Are you/is patient experiencing any problems since coming home? No a. Are there any questions regarding any aspect of care? No 2. Are there any questions regarding medications administration/dosing? No a. Are meds being taken as prescribed? yes b. "Patient should review meds with caller to confirm" Medication list reviewed.  3. Have there been any falls? No 4. Has Home Health been to the house and/or have they contacted you? Has an appointment at [facility] on today 06/28/2019. a. If not, have you tried to contact them? NA b. Can we help you contact them? NA 5. Are bowels and bladder emptying properly? Yes a. Are there any unexpected incontinence issues? No b. If applicable, is patient following bowel/bladder programs? NA 6. Any fevers, problems with breathing, unexpected pain? No 7. Are there any skin problems or new areas of breakdown? No 8. Has the patient/family member arranged specialty MD follow up (ie cardiology/neurology/renal/surgical/etc.)?  Yes, HFU appointments are scheduled.  a. Can we help arrange? NA 9. Does the patient need any other services or support that we can help arrange? No 10. Are caregivers following through as expected in assisting the patient? Yes 11. Has the patient quit smoking, drinking alcohol, or using drugs as recommended? (                        )  Appointment date/time 07/06/2019  arrival time 12:40 for 1:00 appointment with Bayard Hugger. At [place]

## 2019-06-28 NOTE — Patient Instructions (Signed)
Access Code: [email or web address]  URL: [email or web address]  Date: 06/28/2019  Prepared by: Elsie Ra   Exercises  Walking March - 10 reps - 3 sets - 2x daily - 6x weekly  Tandem Walking - 10 reps - 3 sets - 2x daily - 6x weekly  Walking with Head Rotation - 2 reps - 3 sets - 2x daily - 6x weekly  Backwards Walking - 10 reps - 3 sets - 2x daily - 6x weekly  Single Leg Stance - 3-5 reps - 1 sets - as long as you can hold - 2x daily - 6x weekly  Supine Bridge - 10 reps - 1-2 sets - 5 hold - 2x daily - 6x weekly  Bird Dog - 10 reps - 1 sets - 5 sec hold - 2x daily - 6x weekly  Standing Row with Resistance - 10 reps - 2-3 sets - 2x daily - 6x weekly  Shoulder extension with resistance - Neutral - 10 reps - 2-3 sets - 2x daily - 6x weekly  Doorway Pec Stretch at 90 Degrees Abduction - 3 reps - 1 sets - 30 hold - 2x daily - 6x weekly

## 2019-06-28 NOTE — Therapy (Signed)
[facility] [address] [place], [place], [address] Phone: [phone]   Fax:  [phone]  Physical Therapy Evaluation  Patient Details  Name: Spencer Zinter, MD MRN: [id number] Date of Birth: Apr 22, 1941 Referring Provider (PT): Meredith Staggers, MD   Encounter Date: 06/28/2019  PT End of Session - 06/28/19 1341    Visit Number  1    Number of Visits  12    Date for PT Re-Evaluation  08/09/19    Authorization Type  MCR/BCBS supplement, need progress note every 10th    PT Start Time  1110    PT Stop Time  1200    PT Time Calculation (min)  50 min    Activity Tolerance  Patient tolerated treatment well    Behavior During Therapy  [facility] for tasks assessed/performed       Past Medical History:  Diagnosis Date  . Allergy   . Arthritis   . Cancer ([place])   . Cataract   . Chronic kidney disease    atrophic left kidney  . Coronary atherosclerosis    a. 2012 - incidentally noted on CT scan of chest;  b. 08/2011 Myoview: normal; c. 03/2015 CPX @ [facility]: normal w/ PAC's, PVC's, and a 3 beat run of NSVT in recovery.  Marland Kitchen Heart murmur    asymptomatic function murmur.  Marland Kitchen History of echocardiogram    a. 12/2004 Echo: Nl EF, trace TR/MR.  Marland Kitchen Hx of radiation therapy   . Hyperlipidemia   . Personal history of malignant neoplasm of prostate    a. 2008 s/p prostatecomtomy-->radiation-->currently on Lupron injections.  . Sinus bradycardia   . Syncope    a. 12/2016    Past Surgical History:  Procedure Laterality Date  . PROSTATECTOMY    . ROBOT ASSISTED LAPAROSCOPIC RADICAL PROSTATECTOMY    . SHOULDER SURGERY Right   . SKIN GRAFT Left   . TONSILLECTOMY      There were no vitals filed for this visit.   Subjective Assessment - 06/28/19 1113    Subjective  relays he was in good health and then last year started having neurological problems. He had CVA like symptoms on 06/15/19 and had imaging MRI/CT "A 4 mm subcortical  infarct is present in the right left frontaloperculum". Was admitted to inpt rehab 06/18/19 then DC on 06/24/19. He relays some difficulty with memory, processing, problem solving, finding his way to places inside the home, balance, and posture.    Pertinent History  TIA/CVA, hyperlipidemia, prostate cancer 2008 with prostatectomy and radiation therapy, CAD with low-dose aspirin.    Limitations  Standing;Walking;House hold activities    How long can you walk comfortably?  20 min    Diagnostic tests  MRI/CT "A 4 mm subcortical infarct is present in the right left frontaloperculum"    Patient Stated Goals  improve balance, posture, strength, core    Currently in Pain?  No/denies         [facility] Assessment - 06/28/19 0001      Assessment   Medical Diagnosis  CVA     Referring Provider (PT)  Meredith Staggers, MD    Onset Date/Surgical Date  --   CVA on 06/15/19   Hand Dominance  Right    Next MD Visit  sees neurologist 12/7/0    Prior Therapy  inpt rehab      Precautions   Precautions  None      Restrictions   Weight Bearing Restrictions  No      Balance Screen   Has the patient fallen in the past 6 months  No      Prior Function   Level of Independence  Independent      Cognition   Overall Cognitive Status  Impaired/Different from baseline    Area of Impairment  --   impairments in memory, problem solving, finding his way     Sensation   Light Touch  Appears Intact      Coordination   Gross Motor Movements are Fluid and Coordinated  Yes   WNL Repeated alt movements of hands and feet   Fine Motor Movements are Fluid and Coordinated  Yes    Heel Shin Test  Yes      Posture/Postural Control   Posture Comments  slumped posture with fwd head and rounded shoulders      ROM / Strength   AROM / PROM / Strength  Strength      Strength   Overall Strength Comments  5-/5 grossly UE/LE tested in sitting      Transfers   Transfers  Independent with all Transfers       Ambulation/Gait   Gait velocity  20 ft in 5 sec so 4 ft/sec    Gait Comments  300 ft, no AD needed, supervision with mild unsteadiness noted with wider BOS and shuffling at times, decreased hip/knee flexion and heel strike. WFL gait velocity for his age, mod instability with higher level balance challenges.       Standardized Balance Assessment   Standardized Balance Assessment  Berg Balance Test;Five Times Sit to Stand    Five times sit to stand comments   7 sec      Berg Balance Test   Sit to Stand  Able to stand without using hands and stabilize independently    Standing Unsupported  Able to stand safely 2 minutes    Sitting with Back Unsupported but Feet Supported on Floor or Stool  Able to sit safely and securely 2 minutes    Stand to Sit  Sits safely with minimal use of hands    Transfers  Able to transfer safely, minor use of hands    Standing Unsupported with Eyes Closed  Able to stand 10 seconds safely    Standing Unsupported with Feet Together  Able to place feet together independently and stand 1 minute safely    From Standing, Reach Forward with Outstretched Arm  Can reach confidently >25 cm (10")    From Standing Position, Pick up Object from Floor  Able to pick up shoe safely and easily    From Standing Position, Turn to Look Behind Over each Shoulder  Looks behind from both sides and weight shifts well    Turn 360 Degrees  Able to turn 360 degrees safely in 4 seconds or less    Standing Unsupported, Alternately Place Feet on Step/Stool  Able to complete 4 steps without aid or supervision    Standing Unsupported, One Foot in Front  Able to place foot tandem independently and hold 30 seconds    Standing on One Leg  Able to lift leg independently and hold > 10 seconds    Total Score  54      Functional Gait  Assessment   Gait assessed   Yes    Gait Level Surface  Walks 20 ft in less than 5.5 sec, no assistive devices, good speed, no evidence for imbalance, normal gait pattern,  deviates  no more than 6 in outside of the 12 in walkway width.    Change in Gait Speed  Able to smoothly change walking speed without loss of balance or gait deviation. Deviate no more than 6 in outside of the 12 in walkway width.    Gait with Horizontal Head Turns  Performs head turns smoothly with slight change in gait velocity (eg, minor disruption to smooth gait path), deviates 6-10 in outside 12 in walkway width, or uses an assistive device.    Gait with Vertical Head Turns  Performs head turns with no change in gait. Deviates no more than 6 in outside 12 in walkway width.    Gait and Pivot Turn  Pivot turns safely within 3 sec and stops quickly with no loss of balance.    Step Over Obstacle  Is able to step over one shoe box (4.5 in total height) without changing gait speed. No evidence of imbalance.    Gait with Narrow Base of Support  Ambulates 7-9 steps.    Gait with Eyes Closed  Walks 20 ft, uses assistive device, slower speed, mild gait deviations, deviates 6-10 in outside 12 in walkway width. Ambulates 20 ft in less than 9 sec but greater than 7 sec.    Ambulating Backwards  Walks 20 ft, uses assistive device, slower speed, mild gait deviations, deviates 6-10 in outside 12 in walkway width.    Steps  Alternating feet, no rail.    Total Score  25                Objective measurements completed on examination: See above findings.              PT Education - 06/28/19 1341    Education Details  HEP, POC, exam findings    Person(s) Educated  Patient;Spouse    Methods  Explanation;Demonstration;Verbal cues;Handout    Comprehension  Verbalized understanding;Need further instruction          PT Long Term Goals - 06/28/19 1348      PT LONG TERM GOAL #1   Title  Pt will be I and compliant with HEP. (Target for all goals 6 weeks 08/09/19)    Status  New      PT LONG TERM GOAL #2   Title  Pt will improve FGA to at least 27 to show improved dynamic balance and  gait.    Status  New      PT LONG TERM GOAL #3   Title  Pt will be perform 6 minute walk test and write appropriate goal.    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  Presented to hospital 06/15/2019 with CVA like symptoms. Was admitted to inpt rehab 06/18/19 then DC on 06/24/19. He now is having cognitive deficits and deficits with decreased overall strength and endurance, mild gait unsteadiness, and decreased balance. He will benefit from skilled PT to address these deficits.    Personal  Factors and Comorbidities  Comorbidity 1;Comorbidity 2;Comorbidity 3+    Comorbidities  hyperlipidemia, prostate cancer 2008 with prostatectomy and radiation therapy, CAD with low-dose aspirin.    Examination-Activity Limitations  Locomotion Level;Carry;Squat;Stairs;Lift;Stand    Examination-Participation Restrictions  Meal Prep;Cleaning;Community Activity;Driving;Laundry;Yard Work;Shop    Stability/Clinical Decision Making  Evolving/Moderate complexity    Clinical Decision Making  Moderate    Rehab Potential  Good    PT Frequency  2x / week    PT Duration  6 weeks    PT Treatment/Interventions  ADLs/Self Care Home Management;Aquatic Therapy;Gait training;Stair training;Functional mobility training;Therapeutic activities;Therapeutic exercise;Balance training;Neuromuscular re-education;Manual techniques    PT Next Visit Plan  perform 6MWT and write appropriate goal, review HEP and update PRN, needs higher level balance challenges, overall posture, core, strength  and conditioning    PT Home Exercise Plan  Access Code: [email or web address]    Consulted and Agree with Plan of Care  Patient;Family member/caregiver    Family Member Consulted  wife       Patient will benefit from skilled therapeutic intervention in order to improve the following deficits and impairments:  Abnormal gait, Decreased activity tolerance, Decreased balance, Cardiopulmonary status limiting activity, Decreased endurance, Decreased range of motion, Decreased strength, Difficulty walking, Postural dysfunction  Visit Diagnosis: Other abnormalities of gait and mobility  Muscle weakness (generalized)     Problem List Patient Active Problem List   Diagnosis Date Noted  . Slow transit constipation   . Impulsive   . Sleep disturbance   . Prediabetes   . Bradycardia   . Labile blood pressure   . Seizures ([place])   . Hyponatremia 06/18/2019  . Agitation 06/18/2019  . Encephalopathy 06/18/2019  . 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 Sleep apnea, central 03/26/2019  . Transient ischemic attack (TIA) 01/26/2019  . Transient diplopia 12/20/2018  . Dysesthesia affecting both sides of body 12/20/2018  . 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 Snoring 12/20/2018  . Nocturia more than twice per night 12/20/2018  . Insomnia disorder, with other sleep disorder, recurrent 12/20/2018  . Arthritis   . Cancer ([place])   . Elevated troponin   . Syncope 12/26/2016  . Family history of early CAD 12/26/2016  . Hyperlipidemia 08/15/2011  . PERSONAL HISTORY MALIGNANT NEOPLASM PROSTATE 07/11/2010    Spencer Underwood 06/28/2019, 2:51 PM  [facility] [address] [place], [place], [address] Phone: [phone]   Fax:  [phone]  Name: Spencer Blane, MD MRN:  [id number] Date of Birth: [birth year]

## 2019-06-28 NOTE — Telephone Encounter (Signed)
Thank you.  If she prefers to follow-up with a neurologist and he is willing to follow-up with home health orders and recertification then she can follow-up with her neurologist.  Thanks.

## 2019-06-28 NOTE — Telephone Encounter (Signed)
Placed a call to Mrs. Terance Hart and discussed Dr. Posey Pronto decision with her. She will bring Mr. Terance Hart in for transitional care visit with Dr. Posey Pronto per her request. Appointment changed to Dr. Posey Pronto.

## 2019-06-29 ENCOUNTER — Ambulatory Visit: Payer: Medicare Other

## 2019-06-29 DIAGNOSIS — M6281 Muscle weakness (generalized): Secondary | ICD-10-CM | POA: Diagnosis not present

## 2019-06-29 DIAGNOSIS — R41841 Cognitive communication deficit: Secondary | ICD-10-CM | POA: Diagnosis not present

## 2019-06-29 DIAGNOSIS — R4701 Aphasia: Secondary | ICD-10-CM

## 2019-06-29 DIAGNOSIS — R2689 Other abnormalities of gait and mobility: Secondary | ICD-10-CM | POA: Diagnosis not present

## 2019-06-29 NOTE — Patient Instructions (Signed)
Memory Compensation Strategies   1. Use "WRAP" strategy. W= write it down R=  repeat it A=  ssociate it P=  picture it in your mind  2. You can keep a Social worker. Use a 3-ring notebook with sections for the following:  calendar, important names and phone numbers, medications, doctors' names/phone numbers, "to do list"/reminders, and a section to journal what you did each day  3. Use a calendar to write appointments down.  4. Write yourself a schedule for the day.  This can be placed on the calendar or in a separate section of the Memory Notebook.  Keeping a regular schedule can help memory.  5. Use medication organizer with sections for each day or morning/evening pills  You may need help loading it  6. Keep a basket, or pegboard by the door.   Place items that you need to take out with you in the basket or on the pegboard.  You may also want to include a message board for reminders.  7. Use sticky notes. Place sticky notes with reminders in a place where the task is performed.  For example:  "turn off the stove" placed by the stove, "lock the door" placed on the door at eye level, "take your medications" on the bathroom mirror or by the place where you normally take your medications  8. Use alarms/timers.  Use while cooking to remind yourself to check on food or as a reminder to take your medicine, or as a reminder to make a call, or as a reminder to perform another task, etc.  9. Use a small tape recorder to record important information and notes for yourself.

## 2019-06-30 NOTE — Therapy (Signed)
[facility] [address] [place], [place], [address] Phone: [phone]   Fax:  [phone]  Speech Language Pathology Evaluation  Patient Details  Name: Spencer Ezekiel, Spencer Underwood MRN: [id number] Date of Birth: 1941/07/29 Referring Provider (SLP): Alger Simons, Spencer Underwood   Encounter Date: 06/29/2019  End of Session - 06/30/19 0927    Visit Number  1    Number of Visits  17    Date for SLP Re-Evaluation  09/27/19   90 days   SLP Start Time  0933    SLP Stop Time   1015    SLP Time Calculation (min)  42 min    Activity Tolerance  Patient tolerated treatment well       Past Medical History:  Diagnosis Date  . Allergy   . Arthritis   . Cancer ([place])   . Cataract   . Chronic kidney disease    atrophic left kidney  . Coronary atherosclerosis    a. 2012 - incidentally noted on CT scan of chest;  b. 08/2011 Myoview: normal; c. 03/2015 CPX @ [facility]: normal w/ PAC's, PVC's, and a 3 beat run of NSVT in recovery.  Marland Kitchen Heart murmur    asymptomatic function murmur.  Marland Kitchen History of echocardiogram    a. 12/2004 Echo: Nl EF, trace TR/MR.  Marland Kitchen Hx of radiation therapy   . Hyperlipidemia   . Personal history of malignant neoplasm of prostate    a. 2008 s/p prostatecomtomy-->radiation-->currently on Lupron injections.  . Sinus bradycardia   . Syncope    a. 12/2016    Past Surgical History:  Procedure Laterality Date  . PROSTATECTOMY    . ROBOT ASSISTED LAPAROSCOPIC RADICAL PROSTATECTOMY    . SHOULDER SURGERY Right   . SKIN GRAFT Left   . TONSILLECTOMY      There were no vitals filed for this visit.  Subjective Assessment - 06/29/19 1453    Subjective  Pt arrives reporting memory difficulties.    Patient is accompained by:  Family member   wife   Currently in Pain?  No/denies         SLP Evaluation [facility] - 06/29/19 1000      SLP Visit Information   SLP Received On  06/29/19    Referring Provider (SLP)  Alger Simons, Spencer Underwood     Onset Date  spring 2020    Medical Diagnosis  encephalopathy/lt MCA branch infarct      Subjective   Patient/Family Stated Goal  Improve ability to find what is needed without needing help. Pt would like to recall details with others in order to converse in small talk.       Pain Assessment   Currently in Pain?  No/denies      General Information   HPI  Spencer Diener, Spencer Underwood is a 78 y.o. 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 Head CT 06-15-19 had no acute findings with atrophy and chronic small-vessel ischemic changes of the white matter, however on 06-16-19, MR showed "a 4 mm rt lt frontal subcortical infarct". Prior to 10/13, pt had been seeing neurologists last several months for progressive neurological issues. Dr. Brett Fairy documented increased confusion, visiospatial problems, decreased recall, and memory deficits. Patient's wife states he has had several episodes of transient confusional episodes. Although no etiology  has been confirmed, genotyping suggests high risk for Alzheimer's.       Balance Screen   Has the patient fallen in the past 6 months  No    Has the patient had a decrease in activity level because of a fear of falling?   No    Is the patient reluctant to leave their home because of a fear of falling?   No      Prior Functional Status   Cognitive/Linguistic Baseline  Baseline deficits    Baseline deficit details  Visuospatial, memory, confusion    Type of [place]  Retired   urologist     Cognition   Overall Cognitive Status  Impaired/Different from baseline    Area of Impairment  Memory;Awareness    Memory Comments  pt did not recall any aspect of WRAP (memory  compenstaion acronym) whatsoever, nor did he remember discussing with SLP on inpt rehab.    Awareness Comments  Intellectual deficit due partly to memory deficts (?), emergent awareness seen with clock drawing as well as with symbol cancellation. Pt did NOT double check his work.     Awareness  --    Awareness Impairment  --      Auditory Comprehension   Overall Auditory Comprehension  Appears within functional limits for tasks assessed      Verbal Expression   Overall Verbal Expression  Impaired   pt describes some word finding issues since March   Other Verbal Expression Comments  Pt demonstrated rare anomia during eval today - further testing to follow if necessary.      Oral Motor/Sensory Function   Overall Oral Motor/Sensory Function  deferred due to clnic masking policy/time constraints      Motor Speech   Overall Motor Speech  Appears within functional limits for tasks assessed      Standardized Assessments   Standardized Assessments   Cognitive Linguistic Quick Test   initiated - will need to complete this next session                       SLP Short Term Goals - 06/30/19 0932      SLP SHORT TERM GOAL #1   Title  pt will demonstrate intellectual awareness and tell SLP 3 non-physical deficits via multiple choice, direct questioning, or yes/no questions x3 sessions    Time  4    Period  Weeks    Status  New      SLP SHORT TERM GOAL #2   Title  pt will demo emergent awareness (error awareness) in functional simple-mod complex tasks 90% of the time with modified indpendence (double checking for errors)    Time  4    Period  Weeks    Status  New      SLP SHORT TERM GOAL #3   Title  pt will complete cognitive linguistic testing in first 1-2 sessions    Time  2    Period  --   sessions   Status  New       SLP Long Term Goals - 06/30/19 1048      SLP LONG TERM GOAL #1   Title  pt will successfully use memory system/strategies to access appointment  or social schedule, daily tasks or planned tasks with extra time x3 sessions    Time  8  Period  Weeks    Status  New      SLP LONG TERM GOAL #2   Title  pt will demo anticiaptory awareness when completing mod complex/complex cognitive linguistic tasks x 5 sessions    Time  8    Period  Weeks    Status  New      SLP LONG TERM GOAL #3   Title  pt will demo WNL alternating attention between two mod complex tasks x3 sessions    Time  8    Period  Weeks    Status  New      SLP LONG TERM GOAL #4   Title  pt will use compensations for attention skills (reported by pt or wife, or during therapy)x 5 dates    Time  8    Period  Weeks    Status  New      SLP LONG TERM GOAL #5   Title  pt will use anomia compensations (if needed, after further informal or formal assessment) in mod comples conversation in 3 sessions    Time  8    Period  Weeks    Status  New       Plan - 06/30/19 [id number]    Clinical Impression Statement  Pt presents with cognitive communication deficits at least in the areas of memory and awareness, and higher level areas such as executive function and high level problem solving. Pt reports difficulty with anomia however none notable today during eval. Further testing may be necessary in this area. SLP believes pt will benefit from skilled ST targeting these areas.    Speech Therapy Frequency  2x / week    Duration  --   8 weeks or 17 visits   Treatment/Interventions  Functional tasks;SLP instruction and feedback;Compensatory strategies;Patient/family education;Internal/external aids;Cognitive reorganization;Cueing hierarchy;Language facilitation;Environmental controls    Potential to Achieve Goals  Good    Consulted and Agree with Plan of Care  Patient;Family member/caregiver    Family Member Consulted  wife       Patient will benefit from skilled therapeutic intervention in order to improve the following deficits and impairments:   Cognitive communication  deficit  Aphasia    Problem List Patient Active Problem List   Diagnosis Date Noted  . Slow transit constipation   . Impulsive   . Sleep disturbance   . Prediabetes   . Bradycardia   . Labile blood pressure   . Seizures ([place])   . Hyponatremia 06/18/2019  . Agitation 06/18/2019  . Encephalopathy 06/18/2019  . 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 Sleep apnea, central 03/26/2019  . Transient ischemic attack (TIA) 01/26/2019  . Transient diplopia 12/20/2018  . Dysesthesia affecting both sides of body 12/20/2018  . 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 Snoring 12/20/2018  . Nocturia more than twice per night 12/20/2018  . Insomnia disorder, with other sleep disorder, recurrent 12/20/2018  . Arthritis   . Cancer ([place])   . Elevated troponin   . Syncope 12/26/2016  . Family history of early CAD 12/26/2016  . Hyperlipidemia 08/15/2011  . PERSONAL HISTORY MALIGNANT NEOPLASM PROSTATE 07/11/2010    [facility] ,MS, CCC-SLP  06/30/2019, 10:55 AM  [facility] [address] [place], [place], [address] Phone: [phone]   Fax:  [phone]  Name: Spencer Cree, Spencer Underwood  MRN: [id number] Date of Birth: 20-Feb-1941

## 2019-07-02 ENCOUNTER — Encounter: Payer: Self-pay | Admitting: Neurology

## 2019-07-02 DIAGNOSIS — G308 Other Alzheimer's disease: Secondary | ICD-10-CM | POA: Diagnosis not present

## 2019-07-02 DIAGNOSIS — R569 Unspecified convulsions: Secondary | ICD-10-CM | POA: Diagnosis not present

## 2019-07-02 DIAGNOSIS — F411 Generalized anxiety disorder: Secondary | ICD-10-CM | POA: Diagnosis not present

## 2019-07-02 DIAGNOSIS — F0391 Unspecified dementia with behavioral disturbance: Secondary | ICD-10-CM | POA: Insufficient documentation

## 2019-07-02 DIAGNOSIS — F0281 Dementia in other diseases classified elsewhere with behavioral disturbance: Secondary | ICD-10-CM | POA: Diagnosis not present

## 2019-07-02 DIAGNOSIS — G0490 Encephalitis and encephalomyelitis, unspecified: Secondary | ICD-10-CM | POA: Diagnosis not present

## 2019-07-02 DIAGNOSIS — F03918 Unspecified dementia, unspecified severity, with other behavioral disturbance: Secondary | ICD-10-CM | POA: Insufficient documentation

## 2019-07-02 DIAGNOSIS — G4733 Obstructive sleep apnea (adult) (pediatric): Secondary | ICD-10-CM | POA: Diagnosis not present

## 2019-07-02 DIAGNOSIS — R269 Unspecified abnormalities of gait and mobility: Secondary | ICD-10-CM | POA: Diagnosis not present

## 2019-07-05 ENCOUNTER — Encounter: Payer: Medicare Other | Admitting: Physical Medicine & Rehabilitation

## 2019-07-06 ENCOUNTER — Ambulatory Visit: Payer: Medicare Other | Admitting: Speech Pathology

## 2019-07-06 ENCOUNTER — Encounter: Payer: Medicare Other | Admitting: Registered Nurse

## 2019-07-06 ENCOUNTER — Other Ambulatory Visit: Payer: Self-pay

## 2019-07-06 ENCOUNTER — Ambulatory Visit: Payer: Medicare Other | Attending: Physical Medicine & Rehabilitation | Admitting: Physical Therapy

## 2019-07-06 DIAGNOSIS — R4701 Aphasia: Secondary | ICD-10-CM | POA: Diagnosis not present

## 2019-07-06 DIAGNOSIS — R41841 Cognitive communication deficit: Secondary | ICD-10-CM | POA: Diagnosis not present

## 2019-07-06 DIAGNOSIS — H903 Sensorineural hearing loss, bilateral: Secondary | ICD-10-CM | POA: Diagnosis not present

## 2019-07-06 DIAGNOSIS — R2689 Other abnormalities of gait and mobility: Secondary | ICD-10-CM | POA: Diagnosis not present

## 2019-07-06 DIAGNOSIS — M6281 Muscle weakness (generalized): Secondary | ICD-10-CM | POA: Diagnosis not present

## 2019-07-06 NOTE — Therapy (Signed)
[facility] [address] [place], [place], [address] Phone: [phone]   Fax:  [phone]  Physical Therapy Treatment  Patient Details  Name: Spencer Hopf, MD MRN: [id number] Date of Birth: [birth year] Referring Provider (PT): Meredith Staggers, MD   Encounter Date: 07/06/2019  PT End of Session - 07/06/19 1756    Visit Number  2    Number of Visits  12    Date for PT Re-Evaluation  08/09/19    Authorization Type  MCR/BCBS supplement, need progress note every 10th    PT Start Time  1710    PT Stop Time  1756    PT Time Calculation (min)  46 min    Activity Tolerance  Patient tolerated treatment well    Behavior During Therapy  [facility] for tasks assessed/performed       Past Medical History:  Diagnosis Date  . Allergy   . Arthritis   . Cancer ([place])   . Cataract   . Chronic kidney disease    atrophic left kidney  . Coronary atherosclerosis    a. 2012 - incidentally noted on CT scan of chest;  b. 08/2011 Myoview: normal; c. 03/2015 CPX @ [facility]: normal w/ PAC's, PVC's, and a 3 beat run of NSVT in recovery.  Marland Kitchen Heart murmur    asymptomatic function murmur.  Marland Kitchen History of echocardiogram    a. 12/2004 Echo: Nl EF, trace TR/MR.  Marland Kitchen Hx of radiation therapy   . Hyperlipidemia   . Personal history of malignant neoplasm of prostate    a. 2008 s/p prostatecomtomy-->radiation-->currently on Lupron injections.  . Sinus bradycardia   . Syncope    a. 12/2016    Past Surgical History:  Procedure Laterality Date  . PROSTATECTOMY    . ROBOT ASSISTED LAPAROSCOPIC RADICAL PROSTATECTOMY    . SHOULDER SURGERY Right   . SKIN GRAFT Left   . TONSILLECTOMY      There were no vitals filed for this visit.  Subjective Assessment - 07/06/19 1755    Subjective  Relays compliance with HEP and has no questions about them. Relays he was able to go for a 50 minute walk with supervision from family.    Pertinent History  TIA/CVA,  hyperlipidemia, prostate cancer 2008 with prostatectomy and radiation therapy, CAD with low-dose aspirin.    Limitations  Standing;Walking;House hold activities    How long can you walk comfortably?  20 min    Diagnostic tests  MRI/CT "A 4 mm subcortical infarct is present in the right left frontaloperculum"    Patient Stated Goals  improve balance, posture, strength, core    Currently in Pain?  No/denies           [facility] Adult PT Treatment/Exercise - 07/06/19 0001      Therapeutic Activites    Therapeutic Activities  Other Therapeutic Activities    Other Therapeutic Activities  farmers carry 10 lb KB 115 ft X 2 (one lap in each arm), sit to stands with 10 lb KB held close to his chest 2X10 reps      Neuro Re-ed    Neuro Re-ed Details   balance in bars all performed up/down X 3 reps ea: sidestepping on foam beam, tandem walk fwd and retro, hurdles sideways and fwd      Exercises   Exercises  Other Exercises    Other Exercises   doorway pec stretch 30 sec X 3, seated on [place] rows, ext, h abd with  red X 20 reps., seated chops 3 lbs  X10 bilat, eliiptical for endurance 5 min        PT Long Term Goals - 07/06/19 1759      PT LONG TERM GOAL #1   Title  Pt will be I and compliant with HEP. (Target for all goals 6 weeks 08/09/19)    Status  On-going      PT LONG TERM GOAL #2   Title  Pt will improve FGA to at least 27 to show improved dynamic balance and gait.    Status  On-going      PT LONG TERM GOAL #3   Title  Pt will be perform 6 minute walk test and write appropriate goal.    Baseline  no need to perform as he can walk 50 minutes at home with family    Status  Deferred      PT LONG TERM GOAL #4   Title  Pt will report ovreall improvement with endurance, and overall strength and will have 5/5 MMT with gross strength assesment in sitting    Status  On-going            Plan - 07/06/19 1757    Clinical Impression Statement  He was able to perform 50 minute walk at  home with family supervision showing big improvements with his endurance, due to this no need to perform 6 minute walk test today. Session focused on functional strengthening, posture/core, and higher level balance training. He is more unsteady and shaky first rep of a new balance exercise but them improves this after rep 2 and 3. He will continue to benefit from PT for safe progression of balance, strength, endurance in order to reach his functional goals.    Personal Factors and Comorbidities  Comorbidity 1;Comorbidity 2;Comorbidity 3+    Comorbidities  hyperlipidemia, prostate cancer 2008 with prostatectomy and radiation therapy, CAD with low-dose aspirin.    Examination-Activity Limitations  Locomotion Level;Carry;Squat;Stairs;Lift;Stand    Examination-Participation Restrictions  Meal Prep;Cleaning;Community Activity;Driving;Laundry;Yard Work;Shop    Stability/Clinical Decision Making  Evolving/Moderate complexity    Rehab Potential  Good    PT Frequency  2x / week    PT Duration  6 weeks    PT Treatment/Interventions  ADLs/Self Care Home Management;Aquatic Therapy;Gait training;Stair training;Functional mobility training;Therapeutic activities;Therapeutic exercise;Balance training;Neuromuscular re-education;Manual techniques    PT Next Visit Plan  update HEP PRN, needs higher level balance challenges, overall posture, core, strength and conditioning    PT Home Exercise Plan  Access Code: [email or web address]    Consulted and Agree with Plan of Care  Patient;Family member/caregiver    Family Member Consulted  wife       Patient will benefit from skilled therapeutic intervention in order to improve the following deficits and impairments:  Abnormal gait, Decreased activity tolerance, Decreased balance, Cardiopulmonary status limiting activity, Decreased endurance, Decreased range of motion, Decreased strength, Difficulty walking, Postural dysfunction  Visit Diagnosis: Other abnormalities of gait and  mobility  Muscle weakness (generalized)     Problem List Patient Active Problem List   Diagnosis Date Noted  . 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 Slow transit constipation   . Impulsive   . Sleep disturbance   . Prediabetes   . Bradycardia   . Labile blood pressure   . Seizures ([place])   . Hyponatremia 06/18/2019  . Agitation 06/18/2019  . Encephalopathy 06/18/2019  . 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 Sleep apnea, central 03/26/2019  . Transient ischemic attack (TIA) 01/26/2019  . Transient diplopia 12/20/2018  . Dysesthesia affecting both sides of body 12/20/2018  . 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 Snoring 12/20/2018  . Nocturia more than twice per night 12/20/2018  . Insomnia disorder, with other sleep disorder, recurrent 12/20/2018  . Arthritis   . Cancer ([place])   . Elevated troponin   . Syncope 12/26/2016  . Family history of early CAD 12/26/2016  . Hyperlipidemia 08/15/2011  . PERSONAL HISTORY MALIGNANT NEOPLASM PROSTATE 07/11/2010    Silvestre Mesi 07/06/2019, 6:01 PM  [facility] [address] [place], [place], [address] Phone: [phone]   Fax:  [phone]  Name: Spencer Tartaglione, MD MRN: [id number] Date of Birth: 07-Jun-1941

## 2019-07-06 NOTE — Therapy (Signed)
[facility] [address] [place], [place], [address] Phone: [phone]   Fax:  [phone]  Speech Language Pathology Treatment  Patient Details  Name: Spencer Dingley, MD MRN: [id number] Date of Birth: 1941/01/02 Referring Provider (SLP): Alger Simons, MD   Encounter Date: 07/06/2019  End of Session - 07/06/19 1811    Visit Number  2    Number of Visits  17    Date for SLP Re-Evaluation  09/27/19   90 days   SLP Start Time  1620    SLP Stop Time   1708    SLP Time Calculation (min)  48 min    Activity Tolerance  Patient tolerated treatment well       Past Medical History:  Diagnosis Date  . Allergy   . Arthritis   . Cancer ([place])   . Cataract   . Chronic kidney disease    atrophic left kidney  . Coronary atherosclerosis    a. 2012 - incidentally noted on CT scan of chest;  b. 08/2011 Myoview: normal; c. 03/2015 CPX @ [facility]: normal w/ PAC's, PVC's, and a 3 beat run of NSVT in recovery.  Marland Kitchen Heart murmur    asymptomatic function murmur.  Marland Kitchen History of echocardiogram    a. 12/2004 Echo: Nl EF, trace TR/MR.  Marland Kitchen Hx of radiation therapy   . Hyperlipidemia   . Personal history of malignant neoplasm of prostate    a. 2008 s/p prostatecomtomy-->radiation-->currently on Lupron injections.  . Sinus bradycardia   . Syncope    a. 12/2016    Past Surgical History:  Procedure Laterality Date  . PROSTATECTOMY    . ROBOT ASSISTED LAPAROSCOPIC RADICAL PROSTATECTOMY    . SHOULDER SURGERY Right   . SKIN GRAFT Left   . TONSILLECTOMY      There were no vitals filed for this visit.  Subjective Assessment - 07/06/19 1622    Subjective  "I can't remember the details. I have some short term memory issues."    Patient is accompained by:  Family member    Currently in Pain?  No/denies            ADULT SLP TREATMENT - 07/06/19 1811      General Information   Behavior/Cognition  Alert;Cooperative;Pleasant mood       Treatment Provided   Treatment provided  Cognitive-Linquistic      Pain Assessment   Pain Assessment  No/denies pain      Cognitive-Linquistic Treatment   Treatment focused on  Cognition;Patient/family/caregiver education    Skilled Treatment  Pt told SLP of memory deficits when SLP inquired re: last session with another clinician. SLP talked with pt and wife re: compensatory aids and pt's use of calendar/planner in the past. Pt has used a [employer] and they have been considering using one again. SLP provided suggestions and information re: calendar use. SLP noted pt asked his wife to take notes during session; SLP educated pt that our goal is to have him take notes of things he would like to remember. Wife gave pt her notebook and he recorded as SLP explained WRAP strategies (which were trained on rehab and reviewed by therapist last session; pt did not recall). Completed CLQT administration with scores as follows: Attention: 144/215 (mild), Memory 113/185 (moderate), executive functions 23/37 (WNL), Visuospatial skills 66/105 (WNL) Clock Drawing 12/13 (WNL). SLP educated pt re: deficit areas including awareness; pt "I think I am pretty aware of when something is wrong." SLP  used example of pt's errors with symbol cancellation, as well as pt recognizing need to write something down as examples of awareness and pt verbalized understanding. At end of session, pt introduced himself to same PT who he met last week.      Assessment / Recommendations / Plan   Plan  Continue with current plan of care      Progression Toward Goals   Progression toward goals  Progressing toward goals       SLP Education - 07/06/19 1811    Education Details  deficit areas, use of calendar, WRAP strategies    Person(s) Educated  Patient;Spouse    Methods  Explanation    Comprehension  Verbalized understanding       SLP Short Term Goals - 07/06/19 1813      SLP SHORT TERM GOAL #1   Title  pt will demonstrate  intellectual awareness and tell SLP 3 non-physical deficits via multiple choice, direct questioning, or yes/no questions x3 sessions    Time  4    Period  Weeks    Status  On-going      SLP SHORT TERM GOAL #2   Title  pt will demo emergent awareness (error awareness) in functional simple-mod complex tasks 90% of the time with modified indpendence (double checking for errors)    Time  4    Period  Weeks    Status  On-going      SLP SHORT TERM GOAL #3   Title  pt will complete cognitive linguistic testing in first 1-2 sessions    Time  2    Period  --   sessions   Status  Achieved       SLP Long Term Goals - 07/06/19 1813      SLP LONG TERM GOAL #1   Title  pt will successfully use memory system/strategies to access appointment or social schedule, daily tasks or planned tasks with extra time x3 sessions    Time  8    Period  Weeks    Status  On-going      SLP LONG TERM GOAL #2   Title  pt will demo anticiaptory awareness when completing mod complex/complex cognitive linguistic tasks x 5 sessions    Time  8    Period  Weeks    Status  On-going      SLP LONG TERM GOAL #3   Title  pt will demo WNL alternating attention between two mod complex tasks x3 sessions    Time  8    Period  Weeks    Status  On-going      SLP LONG TERM GOAL #4   Title  pt will use compensations for attention skills (reported by pt or wife, or during therapy)x 5 dates    Time  8    Period  Weeks    Status  On-going      SLP LONG TERM GOAL #5   Title  pt will use anomia compensations (if needed, after further informal or formal assessment) in mod comples conversation in 3 sessions    Time  8    Period  Weeks    Status  On-going       Plan - 07/06/19 1812    Clinical Impression Statement  Pt presents with cognitive communication deficits at least in the areas of memory and awareness, and higher level areas such as executive function and high level problem solving. Pt reports difficulty with  anomia however  none notable today during eval. Further testing may be necessary in this area. SLP believes pt will benefit from skilled ST targeting these areas.    Speech Therapy Frequency  2x / week    Duration  --   8 weeks or 17 visits   Treatment/Interventions  Functional tasks;SLP instruction and feedback;Compensatory strategies;Patient/family education;Internal/external aids;Cognitive reorganization;Cueing hierarchy;Language facilitation;Environmental controls    Potential to Achieve Goals  Good       Patient will benefit from skilled therapeutic intervention in order to improve the following deficits and impairments:   Cognitive communication deficit    Problem List Patient Active Problem List   Diagnosis Date Noted  . Neurodegenerative dementia, with behavioral disturbance ([place]) 07/02/2019  . Slow transit constipation   . Impulsive   . Sleep disturbance   . Prediabetes   . Bradycardia   . Labile blood pressure   . Seizures ([place])   . Hyponatremia 06/18/2019  . Agitation 06/18/2019  . Encephalopathy 06/18/2019  . 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 Sleep apnea, central 03/26/2019  . Transient ischemic attack (TIA) 01/26/2019  . Transient diplopia 12/20/2018  . Dysesthesia affecting both sides of body 12/20/2018  . 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 Snoring 12/20/2018  . Nocturia more than twice per night 12/20/2018  . Insomnia disorder, with other sleep disorder, recurrent 12/20/2018  . Arthritis   . Cancer ([place])   . Elevated troponin   . Syncope 12/26/2016  . Family history of early CAD 12/26/2016  . Hyperlipidemia 08/15/2011  . PERSONAL HISTORY MALIGNANT NEOPLASM PROSTATE 07/11/2010   Deneise Lever, [place], [place] Speech-Language Pathologist  Aliene Altes 07/06/2019, [facility] [address] [place], [place], [address] Phone: [phone]   Fax:  [phone]   Name: Previn Jian, MD MRN: [id number] Date of Birth: December 25, 1940

## 2019-07-06 NOTE — Patient Instructions (Signed)
Using a Calendar System to Help Your Memory  . Carry your calendar with you at all times . Decide on a place for your calendar o Choose a place to keep your calendar at home o Put it back in the same place when you finish recording information or when you return home . Review your calendar with someone. o It may be helpful for someone to give you advice about information to record.  . Record information at the time it occurs.  o Don't wait until later to write down information o Ask others for a moment to record in your calendar . Review your calendar at least twice a day. o Morning: check plans, write "To Do" list o Evening: review day, make notes, plan for next day, transfer incomplete tasks to another day . Write in all appointments. o Eg. doctor, dentist, school, hairdresser . Write in weekly scheduled events. o Record things you do each week such as: your therapy schedule, exercise class, garbage day, Sunday school o Add scheduled events for things you have trouble remembering, such as: completing exercises, making phone calls, reviewing your budget . Write in upcoming events. o Birthdays, holidays, family activities, social events, bills due . Write a "To Do" list every day. o Include items you need to do and items you are asked to do o Check items off ONLY after you do them o Record things you can achieve on that day. Look to another day if you are too busy. . Record details from your day. o Write down information from conversations so you can follow up later. o Record information from appointments so you can remember what was said. . Reference your calendar throughout the day. o Check off items on your "To Do" list o Check your schedule . Plan out larger tasks. o Break down into steps and write in your calendar to make the task more manageable . Review your calendar every few days. o Make sure you have transferred "To-Do" items have not completed to another day o Review the  events you've recording and try to remember some details . Review your calendar at the end of the month. o Copy events to the next month.  

## 2019-07-07 DIAGNOSIS — Z8673 Personal history of transient ischemic attack (TIA), and cerebral infarction without residual deficits: Secondary | ICD-10-CM | POA: Diagnosis not present

## 2019-07-07 DIAGNOSIS — F0390 Unspecified dementia without behavioral disturbance: Secondary | ICD-10-CM | POA: Diagnosis not present

## 2019-07-07 DIAGNOSIS — M810 Age-related osteoporosis without current pathological fracture: Secondary | ICD-10-CM | POA: Diagnosis not present

## 2019-07-07 DIAGNOSIS — C61 Malignant neoplasm of prostate: Secondary | ICD-10-CM | POA: Diagnosis not present

## 2019-07-07 DIAGNOSIS — R4189 Other symptoms and signs involving cognitive functions and awareness: Secondary | ICD-10-CM | POA: Diagnosis not present

## 2019-07-07 DIAGNOSIS — R410 Disorientation, unspecified: Secondary | ICD-10-CM | POA: Diagnosis not present

## 2019-07-07 DIAGNOSIS — G4733 Obstructive sleep apnea (adult) (pediatric): Secondary | ICD-10-CM | POA: Diagnosis not present

## 2019-07-08 ENCOUNTER — Other Ambulatory Visit: Payer: Self-pay

## 2019-07-08 ENCOUNTER — Ambulatory Visit: Payer: Medicare Other | Admitting: Physical Therapy

## 2019-07-08 ENCOUNTER — Ambulatory Visit: Payer: Medicare Other | Admitting: Speech Pathology

## 2019-07-08 DIAGNOSIS — M6281 Muscle weakness (generalized): Secondary | ICD-10-CM | POA: Diagnosis not present

## 2019-07-08 DIAGNOSIS — R41841 Cognitive communication deficit: Secondary | ICD-10-CM | POA: Diagnosis not present

## 2019-07-08 DIAGNOSIS — R2689 Other abnormalities of gait and mobility: Secondary | ICD-10-CM

## 2019-07-08 DIAGNOSIS — R4701 Aphasia: Secondary | ICD-10-CM | POA: Diagnosis not present

## 2019-07-08 NOTE — Therapy (Signed)
[facility] [address] [place], [place], [address] Phone: [phone]   Fax:  [phone]  Physical Therapy Treatment  Patient Details  Name: Spencer Cozza, MD MRN: [id number] Date of Birth: 11/07/1940 Referring Provider (PT): Meredith Staggers, MD   Encounter Date: 07/08/2019  PT End of Session - 07/08/19 1631    Visit Number  3    Number of Visits  12    Date for PT Re-Evaluation  08/09/19    Authorization Type  [employer] supplement, need progress note every 10th    PT Start Time  1620    PT Stop Time  1700    PT Time Calculation (min)  40 min    Activity Tolerance  Patient tolerated treatment well    Behavior During Therapy  [facility] for tasks assessed/performed       Past Medical History:  Diagnosis Date  . Allergy   . Arthritis   . Cancer ([place])   . Cataract   . Chronic kidney disease    atrophic left kidney  . Coronary atherosclerosis    a. 2012 - incidentally noted on CT scan of chest;  b. 08/2011 Myoview: normal; c. 03/2015 CPX @ [facility]: normal w/ PAC's, PVC's, and a 3 beat run of NSVT in recovery.  Marland Kitchen Heart murmur    asymptomatic function murmur.  Marland Kitchen History of echocardiogram    a. 12/2004 Echo: Nl EF, trace TR/MR.  Marland Kitchen Hx of radiation therapy   . Hyperlipidemia   . Personal history of malignant neoplasm of prostate    a. 2008 s/p prostatecomtomy-->radiation-->currently on Lupron injections.  . Sinus bradycardia   . Syncope    a. 12/2016    Past Surgical History:  Procedure Laterality Date  . PROSTATECTOMY    . ROBOT ASSISTED LAPAROSCOPIC RADICAL PROSTATECTOMY    . SHOULDER SURGERY Right   . SKIN GRAFT Left   . TONSILLECTOMY      There were no vitals filed for this visit.  Subjective Assessment - 07/08/19 1626    Subjective  Relays he went for a 30 min walk today, no pain or complaints.    Pertinent History  TIA/CVA, hyperlipidemia, prostate cancer 2008 with prostatectomy and radiation  therapy, CAD with low-dose aspirin.    Diagnostic tests  MRI/CT "A 4 mm subcortical infarct is present in the right left frontaloperculum"    Patient Stated Goals  improve balance, posture, strength, core    Currently in Pain?  No/denies                       [facility] Adult PT Treatment/Exercise - 07/08/19 0001      Neuro Re-ed    Neuro Re-ed Details   mod tandem stance with ball toss, walked one lap aound gym 115 ft shile tossing ball to himself, walked one lap moving ball in cirlces with gaze fixed on ball, performed rocker board lateral and ant-post (very shaky on ant-post and not able to perform but could perform lateral), balance on dynadisk inflatable pad, grapevine/braided walking with cueing for technique first 3 reps then able to perform on his own last 2 reps with only min cuing for technique.       Exercises   Other Exercises   treadmill 2.0 to 2.5 mph for 10 min total for endurance and progressed up to 4% incline. Then after balance training ambulated outside in mulch, gravel,,pine needles, up/down hill, and through grass about 750 ft total  with supervision and no LOB                  PT Long Term Goals - 07/06/19 1759      PT LONG TERM GOAL #1   Title  Pt will be I and compliant with HEP. (Target for all goals 6 weeks 08/09/19)    Status  On-going      PT LONG TERM GOAL #2   Title  Pt will improve FGA to at least 27 to show improved dynamic balance and gait.    Status  On-going      PT LONG TERM GOAL #3   Title  Pt will be perform 6 minute walk test and write appropriate goal.    Baseline  no need to perform as he can walk 50 minutes at home with family    Status  Deferred      PT LONG TERM GOAL #4   Title  Pt will report ovreall improvement with endurance, and overall strength and will have 5/5 MMT with gross strength assesment in sitting    Status  On-going            Plan - 07/08/19 1631    Clinical Impression Statement  He was more  steady today with different balance challenges except for rocker board anterior-posterior(very shaky with this and unable to perform). Overall he is progressing well and is more steady with his gait showing improved step length, heel stike, and velocity. He as able to ambulate ouside today on uneven terrain and up/down inclines with no LOB but still requires supervision. Continue POC    Personal Factors and Comorbidities  Comorbidity 1;Comorbidity 2;Comorbidity 3+    Comorbidities  hyperlipidemia, prostate cancer 2008 with prostatectomy and radiation therapy, CAD with low-dose aspirin.    Examination-Activity Limitations  Locomotion Level;Carry;Squat;Stairs;Lift;Stand    Examination-Participation Restrictions  Meal Prep;Cleaning;Community Activity;Driving;Laundry;Yard Work;Shop    Stability/Clinical Decision Making  Evolving/Moderate complexity    Rehab Potential  Good    PT Frequency  2x / week    PT Duration  6 weeks    PT Treatment/Interventions  ADLs/Self Care Home Management;Aquatic Therapy;Gait training;Stair training;Functional mobility training;Therapeutic activities;Therapeutic exercise;Balance training;Neuromuscular re-education;Manual techniques    PT Next Visit Plan  update HEP PRN, needs higher level balance challenges, overall posture, core, strength and conditioning    PT Home Exercise Plan  Access Code: [email or web address]    Consulted and Agree with Plan of Care  Patient;Family member/caregiver    Family Member Consulted  wife       Patient will benefit from skilled therapeutic intervention in order to improve the following deficits and impairments:  Abnormal gait, Decreased activity tolerance, Decreased balance, Cardiopulmonary status limiting activity, Decreased endurance, Decreased range of motion, Decreased strength, Difficulty walking, Postural dysfunction  Visit Diagnosis: Other abnormalities of gait and mobility  Muscle weakness (generalized)     Problem List Patient Active  Problem List   Diagnosis Date Noted  . Neurodegenerative dementia, with behavioral disturbance ([place]) 07/02/2019  . Slow transit constipation   . Impulsive   . Sleep disturbance   . Prediabetes   . Bradycardia   . Labile blood pressure   . Seizures ([place])   . Hyponatremia 06/18/2019  . Agitation 06/18/2019  . Encephalopathy 06/18/2019  . 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 Sleep apnea, central 03/26/2019  . Transient ischemic attack (  TIA) 01/26/2019  . Transient diplopia 12/20/2018  . Dysesthesia affecting both sides of body 12/20/2018  . 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 Snoring 12/20/2018  . Nocturia more than twice per night 12/20/2018  . Insomnia disorder, with other sleep disorder, recurrent 12/20/2018  . Arthritis   . Cancer ([place])   . Elevated troponin   . Syncope 12/26/2016  . Family history of early CAD 12/26/2016  . Hyperlipidemia 08/15/2011  . PERSONAL HISTORY MALIGNANT NEOPLASM PROSTATE 07/11/2010    Silvestre Mesi 07/08/2019, 4:34 PM  [facility] [address] [place], [place], [address] Phone: [phone]   Fax:  [phone]  Name: Spencer Amorin, MD MRN: [id number] Date of Birth: 1940/12/29

## 2019-07-09 NOTE — Therapy (Signed)
[facility] [address] [place], [place], [address] Phone: [phone]   Fax:  [phone]  Speech Language Pathology Treatment  Patient Details  Name: Spencer Kirsh, MD MRN: [id number] Date of Birth: 04-11-1941 Referring Provider (SLP): Alger Simons, MD   Encounter Date: 07/08/2019  End of Session - 07/09/19 0900    Visit Number  3    Number of Visits  17    Date for SLP Re-Evaluation  09/27/19   90 days   SLP Start Time  1619    SLP Stop Time   1700    SLP Time Calculation (min)  41 min    Activity Tolerance  Patient tolerated treatment well       Past Medical History:  Diagnosis Date  . Allergy   . Arthritis   . Cancer ([place])   . Cataract   . Chronic kidney disease    atrophic left kidney  . Coronary atherosclerosis    a. 2012 - incidentally noted on CT scan of chest;  b. 08/2011 Myoview: normal; c. 03/2015 CPX @ [facility]: normal w/ PAC's, PVC's, and a 3 beat run of NSVT in recovery.  Marland Kitchen Heart murmur    asymptomatic function murmur.  Marland Kitchen History of echocardiogram    a. 12/2004 Echo: Nl EF, trace TR/MR.  Marland Kitchen Hx of radiation therapy   . Hyperlipidemia   . Personal history of malignant neoplasm of prostate    a. 2008 s/p prostatecomtomy-->radiation-->currently on Lupron injections.  . Sinus bradycardia   . Syncope    a. 12/2016    Past Surgical History:  Procedure Laterality Date  . PROSTATECTOMY    . ROBOT ASSISTED LAPAROSCOPIC RADICAL PROSTATECTOMY    . SHOULDER SURGERY Right   . SKIN GRAFT Left   . TONSILLECTOMY      There were no vitals filed for this visit.  Subjective Assessment - 07/09/19 0851    Subjective  "I'm [name]." pt introduces himself to therapist he met 2 days ago    Patient is accompained by:  Family member   wife   Currently in Pain?  No/denies            ADULT SLP TREATMENT - 07/09/19 0852      General Information   Behavior/Cognition  Alert;Cooperative;Pleasant  mood      Cognitive-Linquistic Treatment   Treatment focused on  Cognition;Patient/family/caregiver education    Skilled Treatment  Wife reports having pt write a daily schedule at home since last session, "it really seems to help." Did not bring today. Considering purchasing a schedule. SLP worked with pt/wife to develop template based on what they have tried at home. Suggested using with binder with tabs for pt's therapies. With monthly calendar view, pt required max A for current date. Usual mod cues to cross off completed days. Pt wrote current day/date at top of daily schedule template (but also added his name, unprompted). Usual mod-max A and extended time to record activity just completed (PT), current activity (ST) and upcoming activities for the evening (dinner out and a lights show at the [facility]). SLP told pt/wife importance of establishing a routine with schedule; to review each morning, plan for next day in evening, and review throughout the day for orientation/recall as needed. Pt confused re: who was attending dinner with his this evening (despite conversation with wife at beginning of session), writing in his children's names (both live out of state). Pt could not recall where his children lived  or name of his son's spouse, or that his daughter is divorced. SLP suggested to wife including a family members section in his binder, with photos and names of family members.       Assessment / Recommendations / Plan   Plan  Continue with current plan of care      Progression Toward Goals   Progression toward goals  Progressing toward goals       SLP Education - 07/09/19 0900    Education Details  memory binder, calendar/schedule routine    Person(s) Educated  Patient;Spouse    Methods  Explanation;Demonstration    Comprehension  Need further instruction;Verbalized understanding       SLP Short Term Goals - 07/09/19 0902      SLP SHORT TERM GOAL #1   Title  pt will demonstrate  intellectual awareness and tell SLP 3 non-physical deficits via multiple choice, direct questioning, or yes/no questions x3 sessions    Time  4    Period  Weeks    Status  On-going      SLP SHORT TERM GOAL #2   Title  pt will demo emergent awareness (error awareness) in functional simple-mod complex tasks 90% of the time with modified indpendence (double checking for errors)    Time  4    Period  Weeks    Status  On-going      SLP SHORT TERM GOAL #3   Title  pt will complete cognitive linguistic testing in first 1-2 sessions    Time  2    Period  --   sessions   Status  Achieved       SLP Long Term Goals - 07/09/19 0902      SLP LONG TERM GOAL #1   Title  pt will successfully use memory system/strategies to access appointment or social schedule, daily tasks or planned tasks with extra time x3 sessions    Time  8    Period  Weeks    Status  On-going      SLP LONG TERM GOAL #2   Title  pt will demo anticiaptory awareness when completing mod complex/complex cognitive linguistic tasks x 5 sessions    Time  8    Period  Weeks    Status  On-going      SLP LONG TERM GOAL #3   Title  pt will demo WNL alternating attention between two mod complex tasks x3 sessions    Time  8    Period  Weeks    Status  On-going      SLP LONG TERM GOAL #4   Title  pt will use compensations for attention skills (reported by pt or wife, or during therapy)x 5 dates    Time  8    Period  Weeks    Status  On-going      SLP LONG TERM GOAL #5   Title  pt will use anomia compensations (if needed, after further informal or formal assessment) in mod comples conversation in 3 sessions    Time  8    Period  Weeks    Status  On-going       Plan - 07/09/19 0901    Clinical Impression Statement  Pt presents with significant cognitive communication deficits at least in the areas of memory and awareness, and higher level areas such as executive function and high level problem solving. Pt reports  difficulty with anomia; noted today with names of restaurants, family members. Further testing  may be necessary in this area. SLP believes pt will benefit from skilled ST targeting these areas.    Speech Therapy Frequency  2x / week    Duration  --   8 weeks or 17 visits   Treatment/Interventions  Functional tasks;SLP instruction and feedback;Compensatory strategies;Patient/family education;Internal/external aids;Cognitive reorganization;Cueing hierarchy;Language facilitation;Environmental controls    Potential to Achieve Goals  Good       Patient will benefit from skilled therapeutic intervention in order to improve the following deficits and impairments:   Cognitive communication deficit  Aphasia    Problem List Patient Active Problem List   Diagnosis Date Noted  . Neurodegenerative dementia, with behavioral disturbance ([place]) 07/02/2019  . Slow transit constipation   . Impulsive   . Sleep disturbance   . Prediabetes   . Bradycardia   . Labile blood pressure   . Seizures ([place])   . Hyponatremia 06/18/2019  . Agitation 06/18/2019  . Encephalopathy 06/18/2019  . 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 Sleep apnea, central 03/26/2019  . Transient ischemic attack (TIA) 01/26/2019  . Transient diplopia 12/20/2018  . Dysesthesia affecting both sides of body 12/20/2018  . 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 Snoring 12/20/2018  . Nocturia more than twice per night 12/20/2018  . Insomnia disorder, with other sleep disorder, recurrent 12/20/2018  . Arthritis   . Cancer ([place])   . Elevated troponin   . Syncope 12/26/2016  . Family history of early CAD 12/26/2016  . Hyperlipidemia 08/15/2011  . PERSONAL HISTORY MALIGNANT NEOPLASM PROSTATE 07/11/2010   Deneise Lever, [place], [place] Speech-Language  Pathologist   Aliene Altes 07/09/2019, 9:03 AM  [facility] [address] [place], [place], [address] Phone: [phone]   Fax:  [phone]   Name: Brandon Wiechman, MD MRN: [id number] Date of Birth: [birth year]

## 2019-07-12 ENCOUNTER — Ambulatory Visit: Payer: Medicare Other | Admitting: Physical Therapy

## 2019-07-12 ENCOUNTER — Other Ambulatory Visit: Payer: Self-pay

## 2019-07-12 ENCOUNTER — Ambulatory Visit: Payer: Medicare Other

## 2019-07-12 ENCOUNTER — Encounter: Payer: Self-pay | Admitting: Physical Therapy

## 2019-07-12 DIAGNOSIS — R4701 Aphasia: Secondary | ICD-10-CM | POA: Diagnosis not present

## 2019-07-12 DIAGNOSIS — M6281 Muscle weakness (generalized): Secondary | ICD-10-CM

## 2019-07-12 DIAGNOSIS — R41841 Cognitive communication deficit: Secondary | ICD-10-CM | POA: Diagnosis not present

## 2019-07-12 DIAGNOSIS — R2689 Other abnormalities of gait and mobility: Secondary | ICD-10-CM | POA: Diagnosis not present

## 2019-07-12 NOTE — Therapy (Signed)
[facility] [address] [place], [place], [address] Phone: [phone]   Fax:  [phone]  Speech Language Pathology Treatment  Patient Details  Name: Spencer Castagno, MD MRN: [id number] Date of Birth: 05-24-1941 Referring Provider (SLP): Alger Simons, MD   Encounter Date: 07/12/2019  End of Session - 07/12/19 1732    Visit Number  4    Number of Visits  17    Date for SLP Re-Evaluation  09/27/19    SLP Start Time  [id number]    SLP Stop Time   1445    SLP Time Calculation (min)  42 min    Activity Tolerance  Patient tolerated treatment well       Past Medical History:  Diagnosis Date  . Allergy   . Arthritis   . Cancer ([place])   . Cataract   . Chronic kidney disease    atrophic left kidney  . Coronary atherosclerosis    a. 2012 - incidentally noted on CT scan of chest;  b. 08/2011 Myoview: normal; c. 03/2015 CPX @ [facility]: normal w/ PAC's, PVC's, and a 3 beat run of NSVT in recovery.  Marland Kitchen Heart murmur    asymptomatic function murmur.  Marland Kitchen History of echocardiogram    a. 12/2004 Echo: Nl EF, trace TR/MR.  Marland Kitchen Hx of radiation therapy   . Hyperlipidemia   . Personal history of malignant neoplasm of prostate    a. 2008 s/p prostatecomtomy-->radiation-->currently on Lupron injections.  . Sinus bradycardia   . Syncope    a. 12/2016    Past Surgical History:  Procedure Laterality Date  . PROSTATECTOMY    . ROBOT ASSISTED LAPAROSCOPIC RADICAL PROSTATECTOMY    . SHOULDER SURGERY Right   . SKIN GRAFT Left   . TONSILLECTOMY      There were no vitals filed for this visit.  Subjective Assessment - 07/12/19 1409    Subjective  "The weekend was highlighted by the  - - the slip down the - - honey? What happened? Did I fall this weekend?"    Patient is accompained by:  Family member   wife   Currently in Pain?  No/denies            ADULT SLP TREATMENT - 07/12/19 1410      General Information   Behavior/Cognition  Alert;Cooperative;Pleasant mood      Treatment Provided   Treatment provided  Cognitive-Linquistic      Cognitive-Linquistic Treatment   Treatment focused on  Cognition;Patient/family/caregiver education    Skilled Treatment  Pt/wife did not bring notebook. Pt asking wife for details from the weekend - req'd max cues for church yesterday and watching [employer] play football Saturday. Max/total cues for date and day. Pt agreed on a planner where pt can have most information all in one place (and not 3-ring but a planner from [employer]), but wife stated to SLP and reminded pt that "this (type of planner) is a trial"  - SLP ?s if pt may do better with 3-ring binder. due to less visual distraction - SLP to know more about what the planner looks like when pt brings it. SLP encouraged pt to bring the binder next time. SLP education for pt/wife about how to plan for the folowing day with one item planned by pt to start along with other appointments, how pt could journal the events of the day, as he recalled nothing from weekend when asked. SLP provided exampel that pt could have  used a memory notebook/journal section to provide answers for SLP question about events from the weekend.       Assessment / Recommendations / Plan   Plan  Continue with current plan of care      Progression Toward Goals   Progression toward goals  Progressing toward goals       SLP Education - 07/12/19 1731    Education Details  how to use a journal, need to plan in teh afternoon for the following day, how to use memory binder    Person(s) Educated  Patient;Spouse    Methods  Explanation;Verbal cues    Comprehension  Verbalized understanding;Verbal cues required;Need further instruction       SLP Short Term Goals - 07/12/19 [place] #1   Title  pt will demonstrate intellectual awareness and tell SLP 3 non-physical deficits via multiple choice, direct questioning, or yes/no  questions x3 sessions    Time  3    Period  Weeks    Status  On-going      SLP SHORT TERM GOAL #2   Title  pt will demo emergent awareness (error awareness) in functional simple-mod complex tasks 90% of the time with modified indpendence (double checking for errors)    Time  3    Period  Weeks    Status  On-going      SLP SHORT TERM GOAL #3   Title  pt will complete cognitive linguistic testing in first 1-2 sessions    Status  Achieved       SLP Long Term Goals - 07/12/19 [place] #1   Title  pt will successfully use memory system/strategies to access appointment or social schedule, daily tasks or planned tasks with extra time x3 sessions    Time  7    Period  Weeks    Status  On-going      SLP LONG TERM GOAL #2   Title  pt will demo anticiaptory awareness when completing mod complex/complex cognitive linguistic tasks x 5 sessions    Time  7    Period  Weeks    Status  On-going      SLP LONG TERM GOAL #3   Title  pt will demo WNL alternating attention between two mod complex tasks x3 sessions    Time  7    Period  Weeks    Status  On-going      SLP LONG TERM GOAL #4   Title  pt will use compensations for attention skills (reported by pt or wife, or during therapy)x 5 dates    Time  7    Period  Weeks    Status  On-going      SLP LONG TERM GOAL #5   Title  pt will use anomia compensations (if needed, after further informal or formal assessment) in mod comples conversation in 3 sessions    Time  7    Period  Weeks    Status  On-going       Plan - 07/12/19 1733    Clinical Impression Statement  Pt presents with cont'd significant cognitive communication deficits at least in the areas of memory and awareness, and higher level areas such as executive function and high level problem solving. Pt reports difficulty with anomia; noted today with much vague and meandering speech complicated by cognitive deficits. Further testing for anomia may be  necessary.  SLP believes pt will benefit from skilled ST targeting these areas.    Speech Therapy Frequency  2x / week    Duration  --   8 weeks or 17 visits   Treatment/Interventions  Functional tasks;SLP instruction and feedback;Compensatory strategies;Patient/family education;Internal/external aids;Cognitive reorganization;Cueing hierarchy;Language facilitation;Environmental controls    Potential to Achieve Goals  Good       Patient will benefit from skilled therapeutic intervention in order to improve the following deficits and impairments:   Cognitive communication deficit  Aphasia    Problem List Patient Active Problem List   Diagnosis Date Noted  . Neurodegenerative dementia, with behavioral disturbance ([place]) 07/02/2019  . Slow transit constipation   . Impulsive   . Sleep disturbance   . Prediabetes   . Bradycardia   . Labile blood pressure   . Seizures ([place])   . Hyponatremia 06/18/2019  . Agitation 06/18/2019  . Encephalopathy 06/18/2019  . 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 Sleep apnea, central 03/26/2019  . Transient ischemic attack (TIA) 01/26/2019  . Transient diplopia 12/20/2018  . Dysesthesia affecting both sides of body 12/20/2018  . 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 Snoring 12/20/2018  . Nocturia more than twice per night 12/20/2018  . Insomnia disorder, with other sleep disorder, recurrent 12/20/2018  . Arthritis   . Cancer ([place])   . Elevated troponin   . Syncope 12/26/2016  . Family history of early CAD 12/26/2016  . Hyperlipidemia 08/15/2011  . PERSONAL HISTORY MALIGNANT NEOPLASM PROSTATE 07/11/2010    [facility] ,MS, CCC-SLP  07/12/2019, 5:35 PM  [facility] [address] [place], [place], [address] Phone:  [phone]   Fax:  [phone]   Name: Jayshun Kovacevich, MD MRN: [id number] Date of Birth: Feb 15, 1941

## 2019-07-12 NOTE — Therapy (Signed)
[facility] [address] [place], [place], [address] Phone: [phone]   Fax:  [phone]  Physical Therapy Treatment  Patient Details  Name: Spencer Mark, Spencer [facility] MRN: [id number] Date of Birth: July 26, 1941 Referring Provider (PT): Meredith Staggers, Spencer [facility]   Encounter Date: 07/12/2019  PT End of Session - 07/12/19 2055    Visit Number  4    Number of Visits  12    Date for PT Re-Evaluation  08/09/19    Authorization Type  MCR/BCBS supplement, need progress note every 10th    PT Start Time  1535    PT Stop Time  1618    PT Time Calculation (min)  43 min    Equipment Utilized During Treatment  Gait belt    Activity Tolerance  Patient tolerated treatment well    Behavior During Therapy  [facility] for tasks assessed/performed       Past Medical History:  Diagnosis Date  . Allergy   . Arthritis   . Cancer ([place])   . Cataract   . Chronic kidney disease    atrophic left kidney  . Coronary atherosclerosis    a. 2012 - incidentally noted on CT scan of chest;  b. 08/2011 Myoview: normal; c. 03/2015 CPX @ [facility]: normal w/ PAC's, PVC's, and a 3 beat run of NSVT in recovery.  Marland Kitchen Heart murmur    asymptomatic function murmur.  Marland Kitchen History of echocardiogram    a. 12/2004 Echo: Nl EF, trace TR/MR.  Marland Kitchen Hx of radiation therapy   . Hyperlipidemia   . Personal history of malignant neoplasm of prostate    a. 2008 s/p prostatecomtomy-->radiation-->currently on Lupron injections.  . Sinus bradycardia   . Syncope    a. 12/2016    Past Surgical History:  Procedure Laterality Date  . PROSTATECTOMY    . ROBOT ASSISTED LAPAROSCOPIC RADICAL PROSTATECTOMY    . SHOULDER SURGERY Right   . SKIN GRAFT Left   . TONSILLECTOMY      There were no vitals filed for this visit.  Subjective Assessment - 07/12/19 1540    Subjective  Went for a long walk yesterday - 50 minute walk. Has been trying to do more of the exercises at home.    Pertinent  History  TIA/CVA, hyperlipidemia, prostate cancer 2008 with prostatectomy and radiation therapy, CAD with low-dose aspirin.    Diagnostic tests  MRI/CT "A 4 mm subcortical infarct is present in the right left frontaloperculum"    Patient Stated Goals  improve balance, posture, strength, core    Currently in Pain?  No/denies                       [facility] Adult PT Treatment/Exercise - 07/12/19 0001      Ambulation/Gait   Ambulation/Gait  Yes    Ambulation/Gait Assistance  5: Supervision;4: Min guard    Ambulation/Gait Assistance Details  Walking with ball toss, cues for posture and increasing gait speed.     Ambulation Distance (Feet)  115 Feet    Assistive device  None      NMR: -On blue foam: static standing balance: narrower BOS eyes closed 3 x 30 seconds, 2 x 10 reps head turns/nods Standing on Rockerboard:  -A/P weight shifts 1 x 15 reps progressing to no UE support -static standing balance on board, throwing badminton birdies outside of BOS into crate approx. 7' away, with lateral reaching outside of BOS to grab birdie from therapist  -  alternating UE lifts 1 x 10 reps B.  -Standing on blue mat for compliant surface: massed practice SLS taps to 2 colorful bubbles progressing to 2 bubbles and 1 cone for objects of different heights, min guard/min A for balance    -Side stepping with SLS cone taps to 5 cones, progressing by adding cognitive challenge asking pt to name college football teams, pt with increased difficulty and taking increased time to perform.             PT Long Term Goals - 07/06/19 1759      PT LONG TERM GOAL #1   Title  Pt will be I and compliant with HEP. (Target for all goals 6 weeks 08/09/19)    Status  On-going      PT LONG TERM GOAL #2   Title  Pt will improve FGA to at least 27 to show improved dynamic balance and gait.    Status  On-going      PT LONG TERM GOAL #3   Title  Pt will be perform 6 minute walk test and write  appropriate goal.    Baseline  no need to perform as he can walk 50 minutes at home with family    Status  Deferred      PT LONG TERM GOAL #4   Title  Pt will report ovreall improvement with endurance, and overall strength and will have 5/5 MMT with gross strength assesment in sitting    Status  On-going            Plan - 07/12/19 2057    Clinical Impression Statement  Focus of today's skilled session was high level balance on compliant surfaces, SLS, and dynamic gait. Pt able to perform rocker board in A/P directions with no UE support on bars. Pt with increased difficulty performing SLS while presented with a cognitive challenge, took increased time to perform. Will continue to progress towards LTGs    Personal Factors and Comorbidities  Comorbidity 1;Comorbidity 2;Comorbidity 3+    Comorbidities  hyperlipidemia, prostate cancer 2008 with prostatectomy and radiation therapy, CAD with low-dose aspirin.    Examination-Activity Limitations  Locomotion Level;Carry;Squat;Stairs;Lift;Stand    Examination-Participation Restrictions  Meal Prep;Cleaning;Community Activity;Driving;Laundry;Yard Work;Shop    Stability/Clinical Decision Making  Evolving/Moderate complexity    Rehab Potential  Good    PT Frequency  2x / week    PT Duration  6 weeks    PT Treatment/Interventions  ADLs/Self Care Home Management;Aquatic Therapy;Gait training;Stair training;Functional mobility training;Therapeutic activities;Therapeutic exercise;Balance training;Neuromuscular re-education;Manual techniques    PT Next Visit Plan  update HEP PRN, needs higher level balance challenges, overall posture, core, strength and conditioning. SLS, exercises for vestibular system for balance.    PT Home Exercise Plan  Access Code: [email or web address]    Consulted and Agree with Plan of Care  Patient;Family member/caregiver    Family Member Consulted  wife       Patient will benefit from skilled therapeutic intervention in order to improve  the following deficits and impairments:  Abnormal gait, Decreased activity tolerance, Decreased balance, Cardiopulmonary status limiting activity, Decreased endurance, Decreased range of motion, Decreased strength, Difficulty walking, Postural dysfunction  Visit Diagnosis: Other abnormalities of gait and mobility  Muscle weakness (generalized)     Problem List Patient Active Problem List   Diagnosis Date Noted  . Neurodegenerative dementia, with behavioral disturbance ([place]) 07/02/2019  . Slow transit constipation   . Impulsive   . Sleep disturbance   . Prediabetes   .  Bradycardia   . Labile blood pressure   . Seizures ([place])   . Hyponatremia 06/18/2019  . Agitation 06/18/2019  . Encephalopathy 06/18/2019  . 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 Sleep apnea, central 03/26/2019  . Transient ischemic attack (TIA) 01/26/2019  . Transient diplopia 12/20/2018  . Dysesthesia affecting both sides of body 12/20/2018  . 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 Snoring 12/20/2018  . Nocturia more than twice per night 12/20/2018  . Insomnia disorder, with other sleep disorder, recurrent 12/20/2018  . Arthritis   . Cancer ([place])   . Elevated troponin   . Syncope 12/26/2016  . Family history of early CAD 12/26/2016  . Hyperlipidemia 08/15/2011  . PERSONAL HISTORY MALIGNANT NEOPLASM PROSTATE 07/11/2010    Arliss Journey, PT, DPT  07/12/2019, 9:08 PM  [facility] [address] [place], [place], [address] Phone: [phone]   Fax:  [phone]  Name: Spencer Donis, Spencer [facility] MRN: [id number] Date of Birth: 1941/05/30

## 2019-07-16 ENCOUNTER — Encounter: Payer: Self-pay | Admitting: Physical Therapy

## 2019-07-16 ENCOUNTER — Ambulatory Visit: Payer: Medicare Other | Admitting: Physical Therapy

## 2019-07-16 ENCOUNTER — Ambulatory Visit: Payer: Medicare Other

## 2019-07-16 ENCOUNTER — Other Ambulatory Visit: Payer: Self-pay

## 2019-07-16 DIAGNOSIS — R4701 Aphasia: Secondary | ICD-10-CM | POA: Diagnosis not present

## 2019-07-16 DIAGNOSIS — R41841 Cognitive communication deficit: Secondary | ICD-10-CM

## 2019-07-16 DIAGNOSIS — M6281 Muscle weakness (generalized): Secondary | ICD-10-CM

## 2019-07-16 DIAGNOSIS — R2689 Other abnormalities of gait and mobility: Secondary | ICD-10-CM

## 2019-07-16 NOTE — Therapy (Signed)
[facility] [address] [place], [place], [address] Phone: [phone]   Fax:  [phone]  Physical Therapy Treatment  Patient Details  Name: Spencer Mcdonnel, MD MRN: [id number] Date of Birth: Dec 14, 1940 Referring Provider (PT): Meredith Staggers, MD   Encounter Date: 07/16/2019  PT End of Session - 07/16/19 1416    Visit Number  5    Number of Visits  12    Date for PT Re-Evaluation  08/09/19    Authorization Type  MCR/BCBS supplement, need progress note every 10th    PT Start Time  1245   pt arrived 15 minutes late after speech appointment   PT Stop Time  1323    PT Time Calculation (min)  38 min    Equipment Utilized During Treatment  Gait belt    Activity Tolerance  Patient tolerated treatment well    Behavior During Therapy  [facility] for tasks assessed/performed       Past Medical History:  Diagnosis Date  . Allergy   . Arthritis   . Cancer ([place])   . Cataract   . Chronic kidney disease    atrophic left kidney  . Coronary atherosclerosis    a. 2012 - incidentally noted on CT scan of chest;  b. 08/2011 Myoview: normal; c. 03/2015 CPX @ [facility]: normal w/ PAC's, PVC's, and a 3 beat run of NSVT in recovery.  Marland Kitchen Heart murmur    asymptomatic function murmur.  Marland Kitchen History of echocardiogram    a. 12/2004 Echo: Nl EF, trace TR/MR.  Marland Kitchen Hx of radiation therapy   . Hyperlipidemia   . Personal history of malignant neoplasm of prostate    a. 2008 s/p prostatecomtomy-->radiation-->currently on Lupron injections.  . Sinus bradycardia   . Syncope    a. 12/2016    Past Surgical History:  Procedure Laterality Date  . PROSTATECTOMY    . ROBOT ASSISTED LAPAROSCOPIC RADICAL PROSTATECTOMY    . SHOULDER SURGERY Right   . SKIN GRAFT Left   . TONSILLECTOMY      There were no vitals filed for this visit.  Subjective Assessment - 07/16/19 1250    Subjective  Got hearing aids the other day. Is doing well.    Pertinent  History  TIA/CVA, hyperlipidemia, prostate cancer 2008 with prostatectomy and radiation therapy, CAD with low-dose aspirin.    Diagnostic tests  MRI/CT "A 4 mm subcortical infarct is present in the right left frontaloperculum"    Patient Stated Goals  improve balance, posture, strength, core    Currently in Pain?  No/denies                       [facility] Adult PT Treatment/Exercise - 07/16/19 0001      Ambulation/Gait   Ambulation/Gait  Yes    Ambulation/Gait Assistance  5: Supervision    Ambulation/Gait Assistance Details  Initial cueing for recirpocal arm swing and upright posture.    Ambulation Distance (Feet)  200 Feet   throughout session   Assistive device  None    Gait Pattern  Step-through pattern;Decreased arm swing - left;Decreased arm swing - right    Ambulation Surface  Level;Indoor          Access Code: [email or web address]  URL: [email or web address]  Date: 07/16/2019  Prepared by: Janann August   Exercises Walking March - 10 reps - 3 sets - 2x daily - 6x weekly Tandem Walking - 10 reps - 3 sets - 2x daily -  6x weekly Walking with Head Rotation - 2 reps - 3 sets - 2x daily - 6x weekly Backwards Walking - 10 reps - 3 sets - 2x daily - 6x weekly Single Leg Stance - 3-5 reps - 1 sets - as long as you can hold - 2x daily - 6x weekly Supine Bridge - 10 reps - 1-2 sets - 5 hold - 2x daily - 6x weekly Bird Dog - 10 reps - 1 sets - 5 sec hold - 2x daily - 6x weekly Standing Row with Resistance - 10 reps - 2-3 sets - 2x daily - 6x weekly Shoulder extension with resistance - Neutral - 10 reps - 2-3 sets - 2x daily - 6x weekly Doorway Pec Stretch at 90 Degrees Abduction - 3 reps - 1 sets - 30 hold - 2x daily - 6x weekly  New additions to HEP on 07/16/19  Romberg Stance Eyes Closed on Foam Pad - 3 sets - 30 hold - 1x daily - 7x weekly Romberg Stance on Foam Pad with Head Rotation - 5 reps - 2 sets - 1x daily - 7x weekly Romberg Stance with Head Nods on  Foam Pad - 5 reps - 2 sets - 1x daily - 7x weekly     NMR in // bars: Standing on BOSU ball standing on black surface  -A/P weight shifts 1 x 15 reps with no UE support, cues to shift weight forward onto toes -1 x 10 reps performing circles to R, then 1 x 10 reps circles to L - min guard and cues for hip strategy  -2 x 10 reps standing with wide BOS multi-directional reaching outside of BOS to tap cone -2 x 5 reps mini squats without UE support, cues for upright posture.  Standing on blue foam with no UE support: -with cone on 6" step 2 x 10 reps SLS B cone taps with focus on slow control for balance challenge.   Attempted standing on rocker board and SLS cone taps, however pt unable to perform today.    PT Education - 07/16/19 1415    Education Details  corner balance exercise additions to HEP    Person(s) Educated  Patient;Spouse    Methods  Explanation;Demonstration;Handout    Comprehension  Verbalized understanding;Returned demonstration          PT Long Term Goals - 07/06/19 1759      PT LONG TERM GOAL #1   Title  Pt will be I and compliant with HEP. (Target for all goals 6 weeks 08/09/19)    Status  On-going      PT LONG TERM GOAL #2   Title  Pt will improve FGA to at least 27 to show improved dynamic balance and gait.    Status  On-going      PT LONG TERM GOAL #3   Title  Pt will be perform 6 minute walk test and write appropriate goal.    Baseline  no need to perform as he can walk 50 minutes at home with family    Status  Deferred      PT LONG TERM GOAL #4   Title  Pt will report ovreall improvement with endurance, and overall strength and will have 5/5 MMT with gross strength assesment in sitting    Status  On-going            Plan - 07/16/19 1432    Clinical Impression Statement  Focus of today's skilled session was addition of corner balance  exercises with eyes closed and head turns to HEP, and balance on compliant surfaces. pt with good carryover  throughout session for reciprocal arm swing with initial cues. Pt needing cues throughout session for upright posture. Needed min guard for all balance activities today. Pt continues to have difficulty with SLS on the L, especially on compliant surfaces. Will continue to progress towards LTGs.    Personal Factors and Comorbidities  Comorbidity 1;Comorbidity 2;Comorbidity 3+    Comorbidities  hyperlipidemia, prostate cancer 2008 with prostatectomy and radiation therapy, CAD with low-dose aspirin.    Examination-Activity Limitations  Locomotion Level;Carry;Squat;Stairs;Lift;Stand    Examination-Participation Restrictions  Meal Prep;Cleaning;Community Activity;Driving;Laundry;Yard Work;Shop    Stability/Clinical Decision Making  Evolving/Moderate complexity    Rehab Potential  Good    PT Frequency  2x / week    PT Duration  6 weeks    PT Treatment/Interventions  ADLs/Self Care Home Management;Aquatic Therapy;Gait training;Stair training;Functional mobility training;Therapeutic activities;Therapeutic exercise;Balance training;Neuromuscular re-education;Manual techniques    PT Next Visit Plan  pt has 2 PT appointments scheduled next Thurs? make sure he has cancelled one. elliptical for balance/endurance.  needs higher level balance challenges, overall posture, core, strength and conditioning. SLS, exercises for vestibular system for balance. maybe Zoom ball for balance and postural strengthening    PT Home Exercise Plan  Access Code: [email or web address]    Consulted and Agree with Plan of Care  Patient;Family member/caregiver    Family Member Consulted  wife       Patient will benefit from skilled therapeutic intervention in order to improve the following deficits and impairments:  Abnormal gait, Decreased activity tolerance, Decreased balance, Cardiopulmonary status limiting activity, Decreased endurance, Decreased range of motion, Decreased strength, Difficulty walking, Postural dysfunction  Visit  Diagnosis: Muscle weakness (generalized)  Other abnormalities of gait and mobility     Problem List Patient Active Problem List   Diagnosis Date Noted  . Neurodegenerative dementia, with behavioral disturbance ([place]) 07/02/2019  . Slow transit constipation   . Impulsive   . Sleep disturbance   . Prediabetes   . Bradycardia   . Labile blood pressure   . Seizures ([place])   . Hyponatremia 06/18/2019  . Agitation 06/18/2019  . Encephalopathy 06/18/2019  . 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 Sleep apnea, central 03/26/2019  . Transient ischemic attack (TIA) 01/26/2019  . Transient diplopia 12/20/2018  . Dysesthesia affecting both sides of body 12/20/2018  . 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 Snoring 12/20/2018  . Nocturia more than twice per night 12/20/2018  . Insomnia disorder, with other sleep disorder, recurrent 12/20/2018  . Arthritis   . Cancer ([place])   . Elevated troponin   . Syncope 12/26/2016  . Family history of early CAD 12/26/2016  . Hyperlipidemia 08/15/2011  . PERSONAL HISTORY MALIGNANT NEOPLASM PROSTATE 07/11/2010    Arliss Journey, PT, DPT  07/16/2019, 2:39 PM  [facility] [address] [place], [place], [address] Phone: [phone]   Fax:  [phone]  Name: Manuelito Beeghly, MD MRN: [id number] Date of Birth: 10/21/1940

## 2019-07-16 NOTE — Patient Instructions (Signed)
Try some different things to capture details about the things written in your calendar. Keep it simple.

## 2019-07-16 NOTE — Therapy (Signed)
[facility] [address] [place], [place], [address] Phone: [phone]   Fax:  [phone]  Speech Language Pathology Treatment  Patient Details  Name: Spencer Buecker, MD MRN: [id number] Date of Birth: 10-22-1940 Referring Provider (SLP): Alger Simons, MD   Encounter Date: 07/16/2019  End of Session - 07/16/19 1346    Visit Number  5    Number of Visits  17    Date for SLP Re-Evaluation  09/27/19    SLP Start Time  [id number]    SLP Stop Time   [id number]    SLP Time Calculation (min)  43 min    Activity Tolerance  Patient tolerated treatment well       Past Medical History:  Diagnosis Date  . Allergy   . Arthritis   . Cancer ([place])   . Cataract   . Chronic kidney disease    atrophic left kidney  . Coronary atherosclerosis    a. 2012 - incidentally noted on CT scan of chest;  b. 08/2011 Myoview: normal; c. 03/2015 CPX @ [facility]: normal w/ PAC's, PVC's, and a 3 beat run of NSVT in recovery.  Marland Kitchen Heart murmur    asymptomatic function murmur.  Marland Kitchen History of echocardiogram    a. 12/2004 Echo: Nl EF, trace TR/MR.  Marland Kitchen Hx of radiation therapy   . Hyperlipidemia   . Personal history of malignant neoplasm of prostate    a. 2008 s/p prostatecomtomy-->radiation-->currently on Lupron injections.  . Sinus bradycardia   . Syncope    a. 12/2016    Past Surgical History:  Procedure Laterality Date  . PROSTATECTOMY    . ROBOT ASSISTED LAPAROSCOPIC RADICAL PROSTATECTOMY    . SHOULDER SURGERY Right   . SKIN GRAFT Left   . TONSILLECTOMY      There were no vitals filed for this visit.  Subjective Assessment - 07/16/19 1200    Subjective  "where would you like me to sit?"    Patient is accompained by:  Family member   wife   Currently in Pain?  No/denies            ADULT SLP TREATMENT - 07/16/19 1200      General Information   Behavior/Cognition  Alert;Cooperative;Pleasant mood      Treatment Provided   Treatment  provided  Cognitive-Linquistic      Pain Assessment   Pain Assessment  No/denies pain      Cognitive-Linquistic Treatment   Treatment focused on  Cognition    Skilled Treatment  "I do have a problem with my memory." "We've had a variety of sessions dealing with scheduling and knowing what I need to do." (pt, re: what he's done in the past 2-3 days). Pt went to audiologistfor hearing aids. Pt req'd cues to go to his planner for events of last couple days, req'd max/total A to turn to correct page to look at events, but independent telling SLP events once he got to the correct day. SLP asked details of events pt wrote down and pt with very limited/no recall of any details from events, in fact, pt confabulating details in some instances. Pt/wife education about how to simplify pt's needs for memory- wife and pt to trial certain means until next session. SLP reiterated a number of times need for pt to have a means to look at details for recall. SLP looked at pt's [facility] clinic notes from Dr. 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       Assessment / Recommendations / Plan   Plan  Continue with current plan of care      Progression Toward Goals   Progression toward goals  Progressing toward goals   pt making gains towards standardizing a memory system      SLP Education - 07/16/19 1345    Education Details  need to have details from events in calendar, need to keep this simple, options for doing capturing details    Person(s) Educated  Patient;Spouse    Methods  Explanation    Comprehension  Verbalized understanding;Need further instruction;Returned demonstration       SLP Short Term Goals - 07/16/19 1347      SLP SHORT TERM GOAL #1   Title  pt will demonstrate intellectual awareness and tell SLP 3 non-physical deficits via  multiple choice, direct questioning, or yes/no questions x3 sessions    Time  3    Period  Weeks    Status  On-going      SLP SHORT TERM GOAL #2   Title  pt will demo emergent awareness (error awareness) in functional simple-mod complex tasks 90% of the time with modified indpendence (double checking for errors)    Time  3    Period  Weeks    Status  On-going      SLP SHORT TERM GOAL #3   Title  pt will complete cognitive linguistic testing in first 1-2 sessions    Status  Achieved       SLP Long Term Goals - 07/16/19 [place] #1   Title  pt will successfully use memory system/strategies to access appointment or social schedule, daily tasks or planned tasks with extra time x3 sessions    Time  7    Period  Weeks    Status  On-going      SLP LONG TERM GOAL #2   Title  pt will demo anticiaptory awareness when completing mod complex/complex cognitive linguistic tasks x 5 sessions    Time  7    Period  Weeks    Status  On-going      SLP LONG TERM GOAL #3   Title  pt will demo WNL alternating attention between two mod complex tasks x3 sessions    Time  7    Period  Weeks    Status  On-going      SLP LONG TERM GOAL #4   Title  pt will use compensations for attention skills (reported by pt or wife, or during therapy)x 5 dates    Time  7    Period  Weeks    Status  On-going      SLP LONG TERM GOAL #5   Title  pt will use anomia compensations (if needed, after further informal or formal assessment) in mod comples conversation in 3 sessions    Time  7    Period  Weeks    Status  On-going       Plan - 07/16/19 1346    Clinical Impression Statement  Pt presents with cont'd significant cognitive communication deficits at least in the areas of memory and awareness, and higher level areas such as executive function and high level problem solving. Pt cont to report difficulty with anomia; SLP noted today with much vague and meandering  speech complicated by  cognitive deficits. Further testing for anomia may be necessary. SLP believes pt will benefit from skilled ST targeting these areas.    Speech Therapy Frequency  2x / week    Duration  --   8 weeks or 17 visits   Treatment/Interventions  Functional tasks;SLP instruction and feedback;Compensatory strategies;Patient/family education;Internal/external aids;Cognitive reorganization;Cueing hierarchy;Language facilitation;Environmental controls    Potential to Achieve Goals  Good       Patient will benefit from skilled therapeutic intervention in order to improve the following deficits and impairments:   Cognitive communication deficit  Aphasia    Problem List Patient Active Problem List   Diagnosis Date Noted  . Neurodegenerative dementia, with behavioral disturbance ([place]) 07/02/2019  . Slow transit constipation   . Impulsive   . Sleep disturbance   . Prediabetes   . Bradycardia   . Labile blood pressure   . Seizures ([place])   . Hyponatremia 06/18/2019  . Agitation 06/18/2019  . Encephalopathy 06/18/2019  . 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 Sleep apnea, central 03/26/2019  . Transient ischemic attack (TIA) 01/26/2019  . Transient diplopia 12/20/2018  . Dysesthesia affecting both sides of body 12/20/2018  . 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 Snoring 12/20/2018  . Nocturia more than twice per night 12/20/2018  . Insomnia disorder, with other sleep disorder, recurrent 12/20/2018  . Arthritis   . Cancer ([place])   . Elevated troponin   . Syncope 12/26/2016  . Family history of early CAD 12/26/2016  . Hyperlipidemia 08/15/2011  . PERSONAL HISTORY MALIGNANT NEOPLASM PROSTATE 07/11/2010    [facility] ,MS, CCC-SLP  07/16/2019, 1:49 PM  [facility] [address] [place], [place], [address] Phone: [phone]   Fax:  [phone]   Name: Kenric Lindquist, MD MRN: [id number] Date of Birth: 1941/03/18

## 2019-07-20 ENCOUNTER — Ambulatory Visit: Payer: Medicare Other | Admitting: Physical Therapy

## 2019-07-20 ENCOUNTER — Other Ambulatory Visit: Payer: Self-pay

## 2019-07-20 ENCOUNTER — Ambulatory Visit: Payer: Medicare Other | Admitting: Speech Pathology

## 2019-07-20 DIAGNOSIS — M6281 Muscle weakness (generalized): Secondary | ICD-10-CM

## 2019-07-20 DIAGNOSIS — R41841 Cognitive communication deficit: Secondary | ICD-10-CM | POA: Diagnosis not present

## 2019-07-20 DIAGNOSIS — R2689 Other abnormalities of gait and mobility: Secondary | ICD-10-CM

## 2019-07-20 DIAGNOSIS — R4701 Aphasia: Secondary | ICD-10-CM | POA: Diagnosis not present

## 2019-07-20 NOTE — Therapy (Signed)
[facility] [address] [place], [place], [address] Phone: [phone]   Fax:  [phone]  Physical Therapy Treatment  Patient Details  Name: Spencer Matteson, Spencer Underwood MRN: [id number] Date of Birth: 1941-08-22 Referring Provider (PT): Meredith Staggers, Spencer Underwood   Encounter Date: 07/20/2019  PT End of Session - 07/20/19 1356    Visit Number  6    Number of Visits  12    Date for PT Re-Evaluation  08/09/19    Authorization Type  MCR/BCBS supplement, need progress note every 10th    PT Start Time  1145    PT Stop Time  1230    PT Time Calculation (min)  45 min    Equipment Utilized During Treatment  Gait belt    Activity Tolerance  Patient tolerated treatment well    Behavior During Therapy  [facility] for tasks assessed/performed       Past Medical History:  Diagnosis Date  . Allergy   . Arthritis   . Cancer ([place])   . Cataract   . Chronic kidney disease    atrophic left kidney  . Coronary atherosclerosis    a. 2012 - incidentally noted on CT scan of chest;  b. 08/2011 Myoview: normal; c. 03/2015 CPX @ [facility]: normal w/ PAC's, PVC's, and a 3 beat run of NSVT in recovery.  Marland Kitchen Heart murmur    asymptomatic function murmur.  Marland Kitchen History of echocardiogram    a. 12/2004 Echo: Nl EF, trace TR/MR.  Marland Kitchen Hx of radiation therapy   . Hyperlipidemia   . Personal history of malignant neoplasm of prostate    a. 2008 s/p prostatecomtomy-->radiation-->currently on Lupron injections.  . Sinus bradycardia   . Syncope    a. 12/2016    Past Surgical History:  Procedure Laterality Date  . PROSTATECTOMY    . ROBOT ASSISTED LAPAROSCOPIC RADICAL PROSTATECTOMY    . SHOULDER SURGERY Right   . SKIN GRAFT Left   . TONSILLECTOMY      There were no vitals filed for this visit.  Subjective Assessment - 07/20/19 1350    Subjective  NO new complaints, feels PT is helping    Pertinent History  TIA/CVA, hyperlipidemia, prostate cancer 2008 with  prostatectomy and radiation therapy, CAD with low-dose aspirin.    Limitations  Standing;Walking;House hold activities    How long can you walk comfortably?  20 min    Diagnostic tests  MRI/CT "A 4 mm subcortical infarct is present in the right left frontaloperculum"    Patient Stated Goals  improve balance, posture, strength, core    Currently in Pain?  No/denies                       [facility] Adult PT Treatment/Exercise - 07/20/19 0001      High Level Balance   High Level Balance Comments  SLS 20 sec Avg X 5 each side, step taps for balance at stairs tapping first step, then second, then 3rd (X 5 easch side of this), step ups onto and off airex pad without UE support X 10 fwd bilat, then X 10 lateral. Balance on airex with feet together and head turns, then feet apart and eyes closed.       Therapeutic Activites    Other Therapeutic Activities  up/down stairs X 5 reps with no UE support but needs close supervision for balance, worked on finding 3 objects hidden around the gym (took him 2 laps around  the track to find all 3), sit to stands 2X10 no UE support      Exercises   Other Exercises   Elliptical for endurance 3 min then one minute seated rest then 2 additional minutes                  PT Long Term Goals - 07/06/19 1759      PT LONG TERM GOAL #1   Title  Pt will be I and compliant with HEP. (Target for all goals 6 weeks 08/09/19)    Status  On-going      PT LONG TERM GOAL #2   Title  Pt will improve FGA to at least 27 to show improved dynamic balance and gait.    Status  On-going      PT LONG TERM GOAL #3   Title  Pt will be perform 6 minute walk test and write appropriate goal.    Baseline  no need to perform as he can walk 50 minutes at home with family    Status  Deferred      PT LONG TERM GOAL #4   Title  Pt will report ovreall improvement with endurance, and overall strength and will have 5/5 MMT with gross strength assesment in sitting     Status  On-going            Plan - 07/20/19 1356    Clinical Impression Statement  Continued with balance, strength, and endurance as tolerated. Still requires rest breaks but they are becoming much shorter breaks. PT will continue to progress as able.    Personal Factors and Comorbidities  Comorbidity 1;Comorbidity 2;Comorbidity 3+    Comorbidities  hyperlipidemia, prostate cancer 2008 with prostatectomy and radiation therapy, CAD with low-dose aspirin.    Examination-Activity Limitations  Locomotion Level;Carry;Squat;Stairs;Lift;Stand    Examination-Participation Restrictions  Meal Prep;Cleaning;Community Activity;Driving;Laundry;Yard Work;Shop    Stability/Clinical Decision Making  Evolving/Moderate complexity    Rehab Potential  Good    PT Frequency  2x / week    PT Duration  6 weeks    PT Treatment/Interventions  ADLs/Self Care Home Management;Aquatic Therapy;Gait training;Stair training;Functional mobility training;Therapeutic activities;Therapeutic exercise;Balance training;Neuromuscular re-education;Manual techniques    PT Next Visit Plan  pt has 2 PT appointments scheduled next Thurs? make sure he has cancelled one. elliptical for balance/endurance.  needs higher level balance challenges, overall posture, core, strength and conditioning. SLS, exercises for vestibular system for balance. maybe Zoom ball for balance and postural strengthening    PT Home Exercise Plan  Access Code: [email or web address]    Consulted and Agree with Plan of Care  Patient;Family member/caregiver    Family Member Consulted  wife       Patient will benefit from skilled therapeutic intervention in order to improve the following deficits and impairments:  Abnormal gait, Decreased activity tolerance, Decreased balance, Cardiopulmonary status limiting activity, Decreased endurance, Decreased range of motion, Decreased strength, Difficulty walking, Postural dysfunction  Visit Diagnosis: Muscle weakness  (generalized)  Other abnormalities of gait and mobility     Problem List Patient Active Problem List   Diagnosis Date Noted  . Neurodegenerative dementia, with behavioral disturbance ([place]) 07/02/2019  . Slow transit constipation   . Impulsive   . Sleep disturbance   . Prediabetes   . Bradycardia   . Labile blood pressure   . Seizures ([place])   . Hyponatremia 06/18/2019  . Agitation 06/18/2019  . Encephalopathy 06/18/2019  . 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 Sleep apnea, central 03/26/2019  . Transient ischemic attack (TIA) 01/26/2019  . Transient diplopia 12/20/2018  . Dysesthesia affecting both sides of body 12/20/2018  . 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 Snoring 12/20/2018  . Nocturia more than twice per night 12/20/2018  . Insomnia disorder, with other sleep disorder, recurrent 12/20/2018  . Arthritis   . Cancer ([place])   . Elevated troponin   . Syncope 12/26/2016  . Family history of early CAD 12/26/2016  . Hyperlipidemia 08/15/2011  . PERSONAL HISTORY MALIGNANT NEOPLASM PROSTATE 07/11/2010    Silvestre Mesi 07/20/2019, 1:58 PM  [facility] [address] [place], [place], [address] Phone: [phone]   Fax:  [phone]  Name: Spencer Medlen, Spencer Underwood MRN: [id number] Date of Birth: [birth year]

## 2019-07-20 NOTE — Therapy (Signed)
[facility] [address] [place], [place], [address] Phone: [phone]   Fax:  [phone]  Speech Language Pathology Treatment  Patient Details  Name: Spencer Schuch, MD MRN: [id number] Date of Birth: Oct 12, 1940 Referring Provider (SLP): Alger Simons, MD   Encounter Date: 07/20/2019  End of Session - 07/20/19 1312    Visit Number  6    Number of Visits  17    Date for SLP Re-Evaluation  09/27/19    SLP Start Time  [id number]    SLP Stop Time   1145    SLP Time Calculation (min)  40 min    Activity Tolerance  Patient tolerated treatment well       Past Medical History:  Diagnosis Date  . Allergy   . Arthritis   . Cancer ([place])   . Cataract   . Chronic kidney disease    atrophic left kidney  . Coronary atherosclerosis    a. 2012 - incidentally noted on CT scan of chest;  b. 08/2011 Myoview: normal; c. 03/2015 CPX @ [facility]: normal w/ PAC's, PVC's, and a 3 beat run of NSVT in recovery.  Marland Kitchen Heart murmur    asymptomatic function murmur.  Marland Kitchen History of echocardiogram    a. 12/2004 Echo: Nl EF, trace TR/MR.  Marland Kitchen Hx of radiation therapy   . Hyperlipidemia   . Personal history of malignant neoplasm of prostate    a. 2008 s/p prostatecomtomy-->radiation-->currently on Lupron injections.  . Sinus bradycardia   . Syncope    a. 12/2016    Past Surgical History:  Procedure Laterality Date  . PROSTATECTOMY    . ROBOT ASSISTED LAPAROSCOPIC RADICAL PROSTATECTOMY    . SHOULDER SURGERY Right   . SKIN GRAFT Left   . TONSILLECTOMY      There were no vitals filed for this visit.  Subjective Assessment - 07/20/19 1305    Subjective  Pt arrives with bag with journal (no new entries) and planner    Patient is accompained by:  Family member   wife cleo   Currently in Pain?  No/denies            ADULT SLP TREATMENT - 07/20/19 1306      General Information   Behavior/Cognition  Alert;Cooperative;Pleasant mood       Treatment Provided   Treatment provided  Cognitive-Linquistic      Pain Assessment   Pain Assessment  No/denies pain      Cognitive-Linquistic Treatment   Treatment focused on  Cognition;Patient/family/caregiver education    Skilled Treatment  Pt with no new journal entries; has continued recording activities in planner. He required extended time and max/total A to turn to correct page, and again to locate current date. Occasional mod cues to tell SLP activities once current date located. SLP demo'd cuing pt through procedure of locating current week, today, tomorrow, yesterday, last week, etc. and stressed importance of routine, repetition to try to develop pt's procedural memory with use of compensatory aid. SLP told wife that current planner may be too complex to use with pt, however she and pt wish to continue using it at this time. SLP provided suggestions and strategies about how to assist patient with his planner, and types of cues to help develop pt's procedural memory around this task. Added "Today" sticker and asked wife to assist pt with moving this each day.       Assessment / Recommendations / Plan   Plan  Continue with  current plan of care      Progression Toward Goals   Progression toward goals  Progressing toward goals       SLP Education - 07/20/19 1312    Education Details  how to assist/cue pt with calendar    Person(s) Educated  Patient;Spouse    Methods  Explanation;Demonstration    Comprehension  Verbalized understanding;Need further instruction       SLP Short Term Goals - 07/20/19 1313      SLP SHORT TERM GOAL #1   Title  pt will demonstrate intellectual awareness and tell SLP 3 non-physical deficits via multiple choice, direct questioning, or yes/no questions x3 sessions    Time  2    Period  Weeks    Status  On-going      SLP SHORT TERM GOAL #2   Title  pt will demo emergent awareness (error awareness) in functional simple-mod complex tasks 90% of the time  with modified indpendence (double checking for errors)    Time  2    Period  Weeks    Status  On-going      SLP SHORT TERM GOAL #3   Title  pt will complete cognitive linguistic testing in first 1-2 sessions    Status  Achieved       SLP Long Term Goals - 07/20/19 [place] #1   Title  pt will successfully use memory system/strategies to access appointment or social schedule, daily tasks or planned tasks with extra time x3 sessions    Time  6    Period  Weeks    Status  On-going      SLP LONG TERM GOAL #2   Title  pt will demo anticiaptory awareness when completing mod complex/complex cognitive linguistic tasks x 5 sessions    Time  6    Period  Weeks    Status  On-going      SLP LONG TERM GOAL #3   Title  pt will demo WNL alternating attention between two mod complex tasks x3 sessions    Time  6    Period  Weeks    Status  On-going      SLP LONG TERM GOAL #4   Title  pt will use compensations for attention skills (reported by pt or wife, or during therapy)x 5 dates    Time  6    Period  Weeks    Status  On-going      SLP LONG TERM GOAL #5   Title  pt will use anomia compensations (if needed, after further informal or formal assessment) in mod comples conversation in 3 sessions    Time  6    Period  Weeks    Status  On-going       Plan - 07/20/19 1312    Clinical Impression Statement  Pt presents with cont'd significant cognitive communication deficits at least in the areas of memory and awareness, and higher level areas such as executive function and high level problem solving. Pt cont to report difficulty with anomia; SLP noted today with much vague and meandering speech complicated by cognitive deficits. Further testing for anomia may be necessary. SLP believes pt will benefit from skilled ST targeting these areas.    Speech Therapy Frequency  2x / week    Duration  --   8 weeks or 17 visits   Treatment/Interventions  Functional tasks;SLP  instruction and feedback;Compensatory strategies;Patient/family education;Internal/external aids;Cognitive reorganization;Cueing  hierarchy;Language facilitation;Environmental controls    Potential to Achieve Goals  Good       Patient will benefit from skilled therapeutic intervention in order to improve the following deficits and impairments:   Cognitive communication deficit    Problem List Patient Active Problem List   Diagnosis Date Noted  . Neurodegenerative dementia, with behavioral disturbance ([place]) 07/02/2019  . Slow transit constipation   . Impulsive   . Sleep disturbance   . Prediabetes   . Bradycardia   . Labile blood pressure   . Seizures ([place])   . Hyponatremia 06/18/2019  . Agitation 06/18/2019  . Encephalopathy 06/18/2019  . 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 Sleep apnea, central 03/26/2019  . Transient ischemic attack (TIA) 01/26/2019  . Transient diplopia 12/20/2018  . Dysesthesia affecting both sides of body 12/20/2018  . 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 Snoring 12/20/2018  . Nocturia more than twice per night 12/20/2018  . Insomnia disorder, with other sleep disorder, recurrent 12/20/2018  . Arthritis   . Cancer ([place])   . Elevated troponin   . Syncope 12/26/2016  . Family history of early CAD 12/26/2016  . Hyperlipidemia 08/15/2011  . PERSONAL HISTORY MALIGNANT NEOPLASM PROSTATE 07/11/2010   Deneise Lever, [place], [place] Speech-Language Pathologist  Aliene Altes 07/20/2019, 1:14 PM  [facility] [address] [place], [place], [address] Phone: [phone]   Fax:  [phone]   Name: Khamar Standard, MD MRN: [id number] Date of Birth: Jan 13, 1941

## 2019-07-20 NOTE — Patient Instructions (Addendum)
Write in the journal every day. Try to make it a routine activity (around the same time every day).  When using the planner, ask Spencer Underwood questions to help him build procedural memory. Remind him to look at the calendar to find the information. -What day is it today? How do you find it?  -What day is tomorrow? -What day was it yesterday? -What's on your schedule today? -What's on your schedule tomorrow? -What did you do yesterday?

## 2019-07-22 ENCOUNTER — Ambulatory Visit: Payer: Medicare Other | Admitting: Physical Therapy

## 2019-07-22 ENCOUNTER — Ambulatory Visit: Payer: Medicare Other | Admitting: Speech Pathology

## 2019-07-22 ENCOUNTER — Other Ambulatory Visit: Payer: Self-pay

## 2019-07-22 DIAGNOSIS — R2689 Other abnormalities of gait and mobility: Secondary | ICD-10-CM

## 2019-07-22 DIAGNOSIS — R41841 Cognitive communication deficit: Secondary | ICD-10-CM

## 2019-07-22 DIAGNOSIS — R4701 Aphasia: Secondary | ICD-10-CM

## 2019-07-22 DIAGNOSIS — M6281 Muscle weakness (generalized): Secondary | ICD-10-CM | POA: Diagnosis not present

## 2019-07-22 NOTE — Therapy (Signed)
[facility] [address] [place], [place], [address] Phone: [phone]   Fax:  [phone]  Physical Therapy Treatment  Patient Details  Name: Spencer Feehan, MD MRN: [id number] Date of Birth: March 23, 1941 Referring Provider (PT): Meredith Staggers, MD   Encounter Date: 07/22/2019  PT End of Session - 07/22/19 2230    Visit Number  7    Number of Visits  12    Date for PT Re-Evaluation  08/09/19    Authorization Type  MCR/BCBS supplement, need progress note every 10th    PT Start Time  1445    PT Stop Time  1530    PT Time Calculation (min)  45 min    Activity Tolerance  Patient tolerated treatment well    Behavior During Therapy  [facility] for tasks assessed/performed       Past Medical History:  Diagnosis Date  . Allergy   . Arthritis   . Cancer ([place])   . Cataract   . Chronic kidney disease    atrophic left kidney  . Coronary atherosclerosis    a. 2012 - incidentally noted on CT scan of chest;  b. 08/2011 Myoview: normal; c. 03/2015 CPX @ [facility]: normal w/ PAC's, PVC's, and a 3 beat run of NSVT in recovery.  Marland Kitchen Heart murmur    asymptomatic function murmur.  Marland Kitchen History of echocardiogram    a. 12/2004 Echo: Nl EF, trace TR/MR.  Marland Kitchen Hx of radiation therapy   . Hyperlipidemia   . Personal history of malignant neoplasm of prostate    a. 2008 s/p prostatecomtomy-->radiation-->currently on Lupron injections.  . Sinus bradycardia   . Syncope    a. 12/2016    Past Surgical History:  Procedure Laterality Date  . PROSTATECTOMY    . ROBOT ASSISTED LAPAROSCOPIC RADICAL PROSTATECTOMY    . SHOULDER SURGERY Right   . SKIN GRAFT Left   . TONSILLECTOMY      There were no vitals filed for this visit.  Subjective Assessment - 07/22/19 1611    Subjective  no complaints, wife reports he needs to work on arm swing with ambulation    Pertinent History  TIA/CVA, hyperlipidemia, prostate cancer 2008 with prostatectomy and  radiation therapy, CAD with low-dose aspirin.    Limitations  Standing;Walking;House hold activities    How long can you walk comfortably?  20 min    Diagnostic tests  MRI/CT "A 4 mm subcortical infarct is present in the right left frontaloperculum"    Patient Stated Goals  improve balance, posture, strength, core    Currently in Pain?  No/denies         [facility] PT Assessment - 07/22/19 0001      Functional Gait  Assessment   Gait assessed   Yes    Gait Level Surface  Walks 20 ft in less than 5.5 sec, no assistive devices, good speed, no evidence for imbalance, normal gait pattern, deviates no more than 6 in outside of the 12 in walkway width.    Change in Gait Speed  Able to smoothly change walking speed without loss of balance or gait deviation. Deviate no more than 6 in outside of the 12 in walkway width.    Gait with Horizontal Head Turns  Performs head turns smoothly with no change in gait. Deviates no more than 6 in outside 12 in walkway width    Gait with Vertical Head Turns  Performs head turns with no change in gait. Deviates no  more than 6 in outside 12 in walkway width.    Gait and Pivot Turn  Pivot turns safely within 3 sec and stops quickly with no loss of balance.    Step Over Obstacle  Is able to step over 2 stacked shoe boxes taped together (9 in total height) without changing gait speed. No evidence of imbalance.    Gait with Narrow Base of Support  Ambulates 7-9 steps.    Gait with Eyes Closed  Walks 20 ft, uses assistive device, slower speed, mild gait deviations, deviates 6-10 in outside 12 in walkway width. Ambulates 20 ft in less than 9 sec but greater than 7 sec.    Ambulating Backwards  Walks 20 ft, uses assistive device, slower speed, mild gait deviations, deviates 6-10 in outside 12 in walkway width.    Steps  Alternating feet, no rail.    Total Score  27                   [facility] Adult PT Treatment/Exercise - 07/22/19 0001      Ambulation/Gait    Pre-Gait Activities  gait with him holding poles with PT behind to push pull poles in reciprocal arm swings, then had him ambulate with focus on larger arm swing, then had him perform march walking with large arm swing and then had him ambulate after for carryover of larger step length and larger arm swing.      High Level Balance   High Level Balance Comments  SLS 20 sec X 3 ea side      Exercises   Other Exercises   Elliptical for endurance 4 min then one minute seated rest then 2 more minutes                  PT Long Term Goals - 07/06/19 1759      PT LONG TERM GOAL #1   Title  Pt will be I and compliant with HEP. (Target for all goals 6 weeks 08/09/19)    Status  On-going      PT LONG TERM GOAL #2   Title  Pt will improve FGA to at least 27 to show improved dynamic balance and gait.    Status  On-going      PT LONG TERM GOAL #3   Title  Pt will be perform 6 minute walk test and write appropriate goal.    Baseline  no need to perform as he can walk 50 minutes at home with family    Status  Deferred      PT LONG TERM GOAL #4   Title  Pt will report ovreall improvement with endurance, and overall strength and will have 5/5 MMT with gross strength assesment in sitting    Status  On-going            Plan - 07/22/19 2231    Clinical Impression Statement  Retested FGA and he does show small improvement with dyanamic balance. He does still revert to using shorter steps and less arm swing during gait at times so worked on pregait activites to increase step length and arm swing then he was able to show carryover to improving his gait pattern some. He will continue to need reinforcement.    Personal Factors and Comorbidities  Comorbidity 1;Comorbidity 2;Comorbidity 3+    Comorbidities  hyperlipidemia, prostate cancer 2008 with prostatectomy and radiation therapy, CAD with low-dose aspirin.    Examination-Activity Limitations  Locomotion Level;Carry;Squat;Stairs;Lift;Stand  Examination-Participation Restrictions  Meal Prep;Cleaning;Community Activity;Driving;Laundry;Yard Work;Shop    Stability/Clinical Decision Making  Evolving/Moderate complexity    Rehab Potential  Good    PT Frequency  2x / week    PT Duration  6 weeks    PT Treatment/Interventions  ADLs/Self Care Home Management;Aquatic Therapy;Gait training;Stair training;Functional mobility training;Therapeutic activities;Therapeutic exercise;Balance training;Neuromuscular re-education;Manual techniques    PT Next Visit Plan  pt has 2 PT appointments scheduled next Thurs? make sure he has cancelled one. elliptical for balance/endurance.  needs higher level balance challenges, overall posture, core, strength and conditioning. SLS, exercises for vestibular system for balance. maybe Zoom ball for balance and postural strengthening    PT Home Exercise Plan  Access Code: [email or web address], added march walking with arm swings    Consulted and Agree with Plan of Care  Patient;Family member/caregiver    Family Member Consulted  wife       Patient will benefit from skilled therapeutic intervention in order to improve the following deficits and impairments:  Abnormal gait, Decreased activity tolerance, Decreased balance, Cardiopulmonary status limiting activity, Decreased endurance, Decreased range of motion, Decreased strength, Difficulty walking, Postural dysfunction  Visit Diagnosis: Muscle weakness (generalized)  Other abnormalities of gait and mobility     Problem List Patient Active Problem List   Diagnosis Date Noted  . Neurodegenerative dementia, with behavioral disturbance ([place]) 07/02/2019  . Slow transit constipation   . Impulsive   . Sleep disturbance   . Prediabetes   . Bradycardia   . Labile blood pressure   . Seizures ([place])   . Hyponatremia 06/18/2019  . Agitation 06/18/2019  . Encephalopathy 06/18/2019  . 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 Central sleep  apnea syndrome 06/08/2019  . 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 Sleep apnea, central 03/26/2019  . Transient ischemic attack (TIA) 01/26/2019  . Transient diplopia 12/20/2018  . Dysesthesia affecting both sides of body 12/20/2018  . 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 Snoring 12/20/2018  . Nocturia more than twice per night 12/20/2018  . Insomnia disorder, with other sleep disorder, recurrent 12/20/2018  . Arthritis   . Cancer ([place])   . Elevated troponin   . Syncope 12/26/2016  . Family history of early CAD 12/26/2016  . Hyperlipidemia 08/15/2011  . PERSONAL HISTORY MALIGNANT NEOPLASM PROSTATE 07/11/2010    Silvestre Mesi 07/22/2019, 10:34 PM  [facility] [address] [place], [place], [address] Phone: [phone]   Fax:  [phone]  Name: Emari Sibbett, MD MRN: [id number] Date of Birth: 1940-12-19

## 2019-07-22 NOTE — Therapy (Signed)
[facility] [address] [place], [place], [address] Phone: [phone]   Fax:  [phone]  Speech Language Pathology Treatment  Patient Details  Name: Spencer Marksberry, Spencer Underwood MRN: [id number] Date of Birth: 1940/10/31 Referring Provider (SLP): Alger Simons, Spencer Underwood   Encounter Date: 07/22/2019  End of Session - 07/22/19 1512    Visit Number  7    Number of Visits  17    Date for SLP Re-Evaluation  09/27/19    SLP Start Time  1400    SLP Stop Time   1445    SLP Time Calculation (min)  45 min    Activity Tolerance  Patient tolerated treatment well       Past Medical History:  Diagnosis Date  . Allergy   . Arthritis   . Cancer ([place])   . Cataract   . Chronic kidney disease    atrophic left kidney  . Coronary atherosclerosis    a. 2012 - incidentally noted on CT scan of chest;  b. 08/2011 Myoview: normal; c. 03/2015 CPX @ [facility]: normal w/ PAC's, PVC's, and a 3 beat run of NSVT in recovery.  Marland Kitchen Heart murmur    asymptomatic function murmur.  Marland Kitchen History of echocardiogram    a. 12/2004 Echo: Nl EF, trace TR/MR.  Marland Kitchen Hx of radiation therapy   . Hyperlipidemia   . Personal history of malignant neoplasm of prostate    a. 2008 s/p prostatecomtomy-->radiation-->currently on Lupron injections.  . Sinus bradycardia   . Syncope    a. 12/2016    Past Surgical History:  Procedure Laterality Date  . PROSTATECTOMY    . ROBOT ASSISTED LAPAROSCOPIC RADICAL PROSTATECTOMY    . SHOULDER SURGERY Right   . SKIN GRAFT Left   . TONSILLECTOMY      There were no vitals filed for this visit.         ADULT SLP TREATMENT - 07/22/19 1511      General Information   Behavior/Cognition  Alert;Cooperative;Pleasant mood      Treatment Provided   Treatment provided  Cognitive-Linquistic      Pain Assessment   Pain Assessment  No/denies pain      Cognitive-Linquistic Treatment   Treatment focused on   Cognition;Patient/family/caregiver education    Skilled Treatment  Patient arrived with planner, with extended time and min question cue pt opened to correct page and located today's date (using "Today" sticker he is moving with wife each morning). Patient required usual cues to reference his notes to answer SLP questions about current day and the following day. Pt told SLP, "I had speech therapy with Olean Ree," unaware that this was his present activity. Pt looked at note re: dinner with Erasmo Downer (his daughter), and told SLP she was his sister or mother. Mod-max cues for awareness, but when patient recognized that he was confused about his daughter, he expressed frustration with himself. Wife reports patient asked her recently about "my first wife." SLP strongly encouraged wife to put together photos for a memory book to help pt with recall about personal/family details and life events. Generated template for pt, wife to begin working on at home. Patient's wife with questions re: pt's "sudden" confusion with hospitalization. They are planning to see a neurologist with [facility] (a friend) to discuss this tomorrow. SLP told pt and wife that neurologist is more appropriate person to address etiology of pt's cognitive deficits. Explained that with short-term memory issues prior to pt's hospitalization as well  as pt's high level of education, he may have been compensating fairly well, but that with pt's reduced cognitive reserve, acute events may have exacerbated appearance of decline. Explained that presentation of pt's memory deficits is atypical for CVA.      Assessment / Recommendations / Plan   Plan  Continue with current plan of care      Progression Toward Goals   Progression toward goals  Progressing toward goals       SLP Education - 07/22/19 1512    Education Details  memory book and images to assist with functional recall    Person(s) Educated  Patient;Spouse    Methods  Explanation     Comprehension  Verbalized understanding;Need further instruction       SLP Short Term Goals - 07/22/19 1516      SLP SHORT TERM GOAL #1   Title  pt will demonstrate intellectual awareness and tell SLP 3 non-physical deficits via multiple choice, direct questioning, or yes/no questions x3 sessions    Time  2    Period  Weeks    Status  On-going      SLP SHORT TERM GOAL #2   Title  pt will demo emergent awareness (error awareness) in functional simple-mod complex tasks 90% of the time with modified indpendence (double checking for errors)    Time  2    Period  Weeks    Status  On-going      SLP SHORT TERM GOAL #3   Title  pt will complete cognitive linguistic testing in first 1-2 sessions    Status  Achieved       SLP Long Term Goals - 07/22/19 [place] #1   Title  pt will successfully use memory system/strategies to access appointment or social schedule, daily tasks or planned tasks with extra time x3 sessions    Time  6    Period  Weeks    Status  On-going      SLP LONG TERM GOAL #2   Title  pt will demo anticiaptory awareness when completing mod complex/complex cognitive linguistic tasks x 5 sessions    Time  6    Period  Weeks    Status  On-going      SLP LONG TERM GOAL #3   Title  pt will demo WNL alternating attention between two mod complex tasks x3 sessions    Time  6    Period  Weeks    Status  On-going      SLP LONG TERM GOAL #4   Title  pt will use compensations for attention skills (reported by pt or wife, or during therapy)x 5 dates    Time  6    Period  Weeks    Status  On-going      SLP LONG TERM GOAL #5   Title  pt will use anomia compensations (if needed, after further informal or formal assessment) in mod comples conversation in 3 sessions    Time  6    Period  Weeks    Status  On-going       Plan - 07/22/19 1513    Clinical Impression Statement  Pt presents with cont'd significant cognitive communication deficits at least  in the areas of memory and awareness, and higher level areas such as executive function and high level problem solving. Pt errors in recalling family members today appeared to be due to impaired memory vs language,  though pt does have anomia and may benefit from further assessment of this. Educated pt/wife today in benefits of using a memory book with visual images to help pt in talking about family members and recalling life events. SLP believes pt will benefit from skilled ST targeting these areas.    Speech Therapy Frequency  2x / week    Duration  --   8 weeks or 17 visits   Treatment/Interventions  Functional tasks;SLP instruction and feedback;Compensatory strategies;Patient/family education;Internal/external aids;Cognitive reorganization;Cueing hierarchy;Language facilitation;Environmental controls    Potential to Achieve Goals  Good       Patient will benefit from skilled therapeutic intervention in order to improve the following deficits and impairments:   Cognitive communication deficit  Aphasia    Problem List Patient Active Problem List   Diagnosis Date Noted  . Neurodegenerative dementia, with behavioral disturbance ([place]) 07/02/2019  . Slow transit constipation   . Impulsive   . Sleep disturbance   . Prediabetes   . Bradycardia   . Labile blood pressure   . Seizures ([place])   . Hyponatremia 06/18/2019  . Agitation 06/18/2019  . Encephalopathy 06/18/2019  . 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 Sleep apnea, central 03/26/2019  . Transient ischemic attack (TIA) 01/26/2019  . Transient diplopia 12/20/2018  . Dysesthesia affecting both sides of body 12/20/2018  . 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 Snoring 12/20/2018  . Nocturia more than twice per night 12/20/2018  . Insomnia disorder, with other sleep  disorder, recurrent 12/20/2018  . Arthritis   . Cancer ([facility])   . Elevated troponin   . Syncope 12/26/2016  . Family history of early CAD 12/26/2016  . Hyperlipidemia 08/15/2011  . PERSONAL HISTORY MALIGNANT NEOPLASM PROSTATE 07/11/2010   Deneise Lever, [place], [place] 07/22/2019, 3:16 PM  [facility] [address] [place], [place], [address] Phone: [phone]   Fax:  [phone]   Name: Spencer Pencek, Spencer Underwood MRN: [id number] Date of Birth: 09-20-1940

## 2019-07-23 DIAGNOSIS — F0280 Dementia in other diseases classified elsewhere without behavioral disturbance: Secondary | ICD-10-CM | POA: Diagnosis not present

## 2019-07-23 DIAGNOSIS — G309 Alzheimer's disease, unspecified: Secondary | ICD-10-CM | POA: Diagnosis not present

## 2019-07-23 DIAGNOSIS — G3189 Other specified degenerative diseases of nervous system: Secondary | ICD-10-CM | POA: Diagnosis not present

## 2019-07-26 ENCOUNTER — Other Ambulatory Visit: Payer: Self-pay

## 2019-07-26 ENCOUNTER — Ambulatory Visit: Payer: Medicare Other

## 2019-07-26 ENCOUNTER — Ambulatory Visit: Payer: Medicare Other | Admitting: Physical Therapy

## 2019-07-26 DIAGNOSIS — R41841 Cognitive communication deficit: Secondary | ICD-10-CM

## 2019-07-26 DIAGNOSIS — R2689 Other abnormalities of gait and mobility: Secondary | ICD-10-CM | POA: Diagnosis not present

## 2019-07-26 DIAGNOSIS — M6281 Muscle weakness (generalized): Secondary | ICD-10-CM | POA: Diagnosis not present

## 2019-07-26 DIAGNOSIS — R4701 Aphasia: Secondary | ICD-10-CM | POA: Diagnosis not present

## 2019-07-26 NOTE — Therapy (Signed)
[facility] [address] [place], [place], [address] Phone: [phone]   Fax:  [phone]  Speech Language Pathology Treatment  Patient Details  Name: Spencer Teem, MD MRN: [id number] Date of Birth: 1940/10/17 Referring Provider (SLP): Alger Simons, MD   Encounter Date: 07/26/2019  End of Session - 07/26/19 1654    Visit Number  8    Number of Visits  17    Date for SLP Re-Evaluation  09/27/19    SLP Start Time  [id number]    SLP Stop Time   1447    SLP Time Calculation (min)  42 min    Activity Tolerance  Patient tolerated treatment well       Past Medical History:  Diagnosis Date  . Allergy   . Arthritis   . Cancer ([place])   . Cataract   . Chronic kidney disease    atrophic left kidney  . Coronary atherosclerosis    a. 2012 - incidentally noted on CT scan of chest;  b. 08/2011 Myoview: normal; c. 03/2015 CPX @ [facility]: normal w/ PAC's, PVC's, and a 3 beat run of NSVT in recovery.  Marland Kitchen Heart murmur    asymptomatic function murmur.  Marland Kitchen History of echocardiogram    a. 12/2004 Echo: Nl EF, trace TR/MR.  Marland Kitchen Hx of radiation therapy   . Hyperlipidemia   . Personal history of malignant neoplasm of prostate    a. 2008 s/p prostatecomtomy-->radiation-->currently on Lupron injections.  . Sinus bradycardia   . Syncope    a. 12/2016    Past Surgical History:  Procedure Laterality Date  . PROSTATECTOMY    . ROBOT ASSISTED LAPAROSCOPIC RADICAL PROSTATECTOMY    . SHOULDER SURGERY Right   . SKIN GRAFT Left   . TONSILLECTOMY      There were no vitals filed for this visit.  Subjective Assessment - 07/26/19 1410    Subjective  Pt entered and walked down the wrong hallway towards PT gym.    Patient is accompained by:  Family member   wife   Currently in Pain?  No/denies            ADULT SLP TREATMENT - 07/26/19 1410      General Information   Behavior/Cognition  Alert;Cooperative;Pleasant mood      Treatment Provided   Treatment provided  Cognitive-Linquistic      Pain Assessment   Pain Assessment  No/denies pain      Cognitive-Linquistic Treatment   Treatment focused on  Cognition;Patient/family/caregiver education    Skilled Treatment  Wife stated pt looked in his calendar this morning to see when he had appointments today. Pt no longer on Keppra as of Saturday, to see if this med contributed to confusion (via neurologist friend). Pt's daughter going to put together a memory book for names of family members, life events, etc. SLP asked pt about results of weekend and pt responded with vague speech in 2/4 instances and with confabulation in the other two until wife intervened or SLP asked wife for confirmation. SLP highlighted the need for a journal for pt.       Assessment / Recommendations / Plan   Plan  Continue with current plan of care      Progression Toward Goals   Progression toward goals  Not progressing toward goals (comment)   pt would benefit from journal      SLP Education - 07/26/19 1654    Education Details  pt would benefit from  daily journal    Person(s) Educated  Patient;Spouse    Methods  Explanation;Demonstration    Comprehension  Verbalized understanding;Need further instruction       SLP Short Term Goals - 07/26/19 1658      SLP SHORT TERM GOAL #1   Title  pt will demonstrate intellectual awareness and tell SLP 3 non-physical deficits via multiple choice, direct questioning, or yes/no questions x3 sessions    Time  1    Period  Weeks    Status  On-going      SLP SHORT TERM GOAL #2   Title  pt will demo emergent awareness (error awareness) in functional simple-mod complex tasks 90% of the time with modified indpendence (double checking for errors)    Time  1    Period  Weeks    Status  On-going      SLP SHORT TERM GOAL #3   Title  pt will complete cognitive linguistic testing in first 1-2 sessions    Status  Achieved       SLP Long Term Goals  - 07/26/19 1659      SLP LONG TERM GOAL #1   Title  pt will successfully use memory system/strategies to access appointment or social schedule, daily tasks or planned tasks with extra time x3 sessions    Time  5    Period  Weeks    Status  On-going      SLP LONG TERM GOAL #2   Title  pt will demo anticiaptory awareness when completing mod complex/complex cognitive linguistic tasks x 5 sessions    Time  5    Period  Weeks    Status  On-going      SLP LONG TERM GOAL #3   Title  pt will demo WNL alternating attention between two mod complex tasks x3 sessions    Time  5    Period  Weeks    Status  On-going      SLP LONG TERM GOAL #4   Title  pt will use compensations for attention skills (reported by pt or wife, or during therapy)x 5 dates    Time  5    Period  Weeks    Status  On-going      SLP LONG TERM GOAL #5   Title  pt will use anomia compensations (if needed, after further informal or formal assessment) in mod comples conversation in 3 sessions    Time  5    Period  Weeks    Status  On-going       Plan - 07/26/19 1655    Clinical Impression Statement  Pt continues to present with significant cognitive communication deficits at least in the areas of memory and awareness, and higher level areas such as executive function and high level problem solving. Pt errors in recalling events from previous two days appeared to be due to impaired memory vs language, though pt does have anomia and may benefit from further assessment of this. SLP stressed need for a daily journal which pt can use for information about recent events. Pt recalled today only with cue from wife that their daughter and ("...are they engaged, honey?") boyfriend visited this weekend. Educated pt/wife today in benefits of using a memory book with visual images to help pt in talking about family members and recalling life events. SLP believes pt will benefit from skilled ST targeting these areas.    Speech Therapy  Frequency  2x / week  Duration  --   8 weeks or 17 visits   Treatment/Interventions  Functional tasks;SLP instruction and feedback;Compensatory strategies;Patient/family education;Internal/external aids;Cognitive reorganization;Cueing hierarchy;Language facilitation;Environmental controls    Potential to Achieve Goals  Good       Patient will benefit from skilled therapeutic intervention in order to improve the following deficits and impairments:   Cognitive communication deficit  Aphasia    Problem List Patient Active Problem List   Diagnosis Date Noted  . Neurodegenerative dementia, with behavioral disturbance ([facility]) 07/02/2019  . Slow transit constipation   . Impulsive   . Sleep disturbance   . Prediabetes   . Bradycardia   . Labile blood pressure   . Seizures ([facility])   . Hyponatremia 06/18/2019  . Agitation 06/18/2019  . Encephalopathy 06/18/2019  . 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 Sleep apnea, central 03/26/2019  . Transient ischemic attack (TIA) 01/26/2019  . Transient diplopia 12/20/2018  . Dysesthesia affecting both sides of body 12/20/2018  . 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 Snoring 12/20/2018  . Nocturia more than twice per night 12/20/2018  . Insomnia disorder, with other sleep disorder, recurrent 12/20/2018  . Arthritis   . Cancer ([facility])   . Elevated troponin   . Syncope 12/26/2016  . Family history of early CAD 12/26/2016  . Hyperlipidemia 08/15/2011  . PERSONAL HISTORY MALIGNANT NEOPLASM PROSTATE 07/11/2010    [facility] ,[place], CCC-SLP  07/26/2019, 5:00 PM  [facility] [address] [place], [place], [address] Phone: [phone]   Fax:  [phone]   Name: Lucille Risden, MD MRN: [id number] Date of Birth:  [birth year]

## 2019-07-28 ENCOUNTER — Ambulatory Visit: Payer: Medicare Other | Admitting: Physical Therapy

## 2019-07-28 ENCOUNTER — Ambulatory Visit: Payer: Medicare Other

## 2019-08-02 ENCOUNTER — Other Ambulatory Visit: Payer: Self-pay

## 2019-08-02 ENCOUNTER — Ambulatory Visit: Payer: Medicare Other | Admitting: Physical Therapy

## 2019-08-02 ENCOUNTER — Ambulatory Visit: Payer: Medicare Other

## 2019-08-02 ENCOUNTER — Encounter: Payer: Self-pay | Admitting: Physical Therapy

## 2019-08-02 DIAGNOSIS — R2689 Other abnormalities of gait and mobility: Secondary | ICD-10-CM | POA: Diagnosis not present

## 2019-08-02 DIAGNOSIS — H538 Other visual disturbances: Secondary | ICD-10-CM | POA: Diagnosis not present

## 2019-08-02 DIAGNOSIS — M6281 Muscle weakness (generalized): Secondary | ICD-10-CM | POA: Diagnosis not present

## 2019-08-02 DIAGNOSIS — R4701 Aphasia: Secondary | ICD-10-CM | POA: Diagnosis not present

## 2019-08-02 DIAGNOSIS — Z961 Presence of intraocular lens: Secondary | ICD-10-CM | POA: Diagnosis not present

## 2019-08-02 DIAGNOSIS — H5000 Unspecified esotropia: Secondary | ICD-10-CM | POA: Diagnosis not present

## 2019-08-02 DIAGNOSIS — H2511 Age-related nuclear cataract, right eye: Secondary | ICD-10-CM | POA: Diagnosis not present

## 2019-08-02 DIAGNOSIS — H35372 Puckering of macula, left eye: Secondary | ICD-10-CM | POA: Diagnosis not present

## 2019-08-02 DIAGNOSIS — G319 Degenerative disease of nervous system, unspecified: Secondary | ICD-10-CM | POA: Diagnosis not present

## 2019-08-02 DIAGNOSIS — R41841 Cognitive communication deficit: Secondary | ICD-10-CM | POA: Diagnosis not present

## 2019-08-02 DIAGNOSIS — Z9842 Cataract extraction status, left eye: Secondary | ICD-10-CM | POA: Diagnosis not present

## 2019-08-02 NOTE — Therapy (Signed)
[facility] [address] [place], [place], [address] Phone: [phone]   Fax:  [phone]  Speech Language Pathology Treatment  Patient Details  Name: Spencer Ellen, MD MRN: [id number] Date of Birth: Jul 24, 1941 Referring Provider (SLP): Alger Simons, MD   Encounter Date: 08/02/2019  End of Session - 08/02/19 1737    Visit Number  9    Number of Visits  17    Date for SLP Re-Evaluation  09/27/19    SLP Start Time  4585    SLP Stop Time   1445    SLP Time Calculation (min)  40 min    Activity Tolerance  Patient tolerated treatment well       Past Medical History:  Diagnosis Date  . Allergy   . Arthritis   . Cancer ([facility])   . Cataract   . Chronic kidney disease    atrophic left kidney  . Coronary atherosclerosis    a. 2012 - incidentally noted on CT scan of chest;  b. 08/2011 Myoview: normal; c. 03/2015 CPX @ [facility]: normal w/ PAC's, PVC's, and a 3 beat run of NSVT in recovery.  Marland Kitchen Heart murmur    asymptomatic function murmur.  Marland Kitchen History of echocardiogram    a. 12/2004 Echo: Nl EF, trace TR/MR.  Marland Kitchen Hx of radiation therapy   . Hyperlipidemia   . Personal history of malignant neoplasm of prostate    a. 2008 s/p prostatecomtomy-->radiation-->currently on Lupron injections.  . Sinus bradycardia   . Syncope    a. 12/2016    Past Surgical History:  Procedure Laterality Date  . PROSTATECTOMY    . ROBOT ASSISTED LAPAROSCOPIC RADICAL PROSTATECTOMY    . SHOULDER SURGERY Right   . SKIN GRAFT Left   . TONSILLECTOMY      There were no vitals filed for this visit.         ADULT SLP TREATMENT - 08/02/19 1401      General Information   Behavior/Cognition  Alert;Cooperative;Pleasant mood      Treatment Provided   Treatment provided  Cognitive-Linquistic      Cognitive-Linquistic Treatment   Treatment focused on  Cognition;Patient/family/caregiver education    Skilled Treatment  Pt req'd max cues  to look in his calendar to tell SLP events of last few days. Pt with confabulation throughout session without awareness. Req'd cues for finding today in his calendar (total A once, and once mod-max A) - despite the yellow "TODAY" sticker. Max cues needed to look for what holiday was last week and total A for what pt did for the holiday (confabulation present ). Pt req'd mod cues and three attempts for the dates for Christmas and Christmas Eve - did not look in his calendar for these dates.Wife noted to appropriately cue pt for memory book. Pt expressed disappointment at his performance in today's session (intellectual awareness).       Assessment / Recommendations / Plan   Plan  Continue with current plan of care   goals downgraded     Progression Toward Goals   Progression toward goals  Progressing toward goals   needs cues to use calendar/journal      SLP Education - 08/02/19 1732    Education Details  using calendar instead of trying to recall independently the events that have recently occurred - which most often leads to confabulation    Person(s) Educated  Patient;Spouse    Methods  Explanation       SLP  Short Term Goals - 08/02/19 1742      SLP SHORT TERM GOAL #1   Title  pt will demonstrate intellectual awareness and tell SLP 3 non-physical deficits via multiple choice, direct questioning, or yes/no questions x3 sessions    Status  Partially Met      SLP SHORT TERM GOAL #2   Title  pt will demo emergent awareness (error awareness) in functional simple-mod complex tasks 90% of the time with modified indpendence (double checking for errors)    Status  Not Met      SLP SHORT TERM GOAL #3   Title  pt will complete cognitive linguistic testing in first 1-2 sessions    Status  Achieved       SLP Long Term Goals - 08/02/19 1743      SLP LONG TERM GOAL #1   Title  pt will successfully use memory system/strategies to access appointment or social schedule, daily tasks/events, or  planned tasks with occasional mod cues and extra time x3 sessions    Time  4    Period  Weeks    Status  Revised   "with occasional mod cues" added     SLP LONG TERM GOAL #2   Title  pt will demo emergent awareness when completing simple cognitive linguistic tasks x 5 sessions    Time  4    Period  Weeks    Status  Revised   "anticiapatory" to "emergent", "mod complex-complex" to "sipmle"     SLP LONG TERM GOAL #3   Title  pt will demo WNL alternating attention between two simple tasks x3 sessions    Time  5    Period  Weeks    Status  Revised   "mod complex" to "simple     SLP LONG TERM GOAL #4   Title  pt will use compensations for attention skills (reported by pt or wife, or during therapy) with cues to do so x 5 dates    Time  4    Period  Weeks    Status  Revised   added "with cues to do so"     SLP LONG TERM GOAL #5   Title  pt will use anomia compensations (if needed, after further informal or formal assessment) in mod comples conversation in 3 sessions    Time  4    Period  Weeks    Status  On-going       Plan - 08/02/19 1737    Clinical Impression Statement  Pt continues to present with significant cognitive communication deficits at least in the areas of memory and awareness, and higher level areas such as executive function and high level problem solving. Pt req'd consistent cues for use of daily journal which pt can use for information about recent events. Pt presents more like a pt with diffuse injury rather than focal injury. SLP considering focusing mainly on compensatory measures for memory, and believe pt will benefit from skilled ST targeting this,  for caregiver education.    Speech Therapy Frequency  2x / week    Duration  --   8 weeks or 17 visits   Treatment/Interventions  Functional tasks;SLP instruction and feedback;Compensatory strategies;Patient/family education;Internal/external aids;Cognitive reorganization;Cueing hierarchy;Language  facilitation;Environmental controls    Potential to Achieve Goals  Good       Patient will benefit from skilled therapeutic intervention in order to improve the following deficits and impairments:   Cognitive communication deficit  Aphasia  Problem List Patient Active Problem List   Diagnosis Date Noted  . Neurodegenerative dementia, with behavioral disturbance ([facility]) 07/02/2019  . Slow transit constipation   . Impulsive   . Sleep disturbance   . Prediabetes   . Bradycardia   . Labile blood pressure   . Seizures ([facility] [place])   . Hyponatremia 06/18/2019  . Agitation 06/18/2019  . Encephalopathy 06/18/2019  . 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 Sleep apnea, central 03/26/2019  . Transient ischemic attack (TIA) 01/26/2019  . Transient diplopia 12/20/2018  . Dysesthesia affecting both sides of body 12/20/2018  . 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 Snoring 12/20/2018  . Nocturia more than twice per night 12/20/2018  . Insomnia disorder, with other sleep disorder, recurrent 12/20/2018  . Arthritis   . Cancer ([facility])   . Elevated troponin   . Syncope 12/26/2016  . Family history of early CAD 12/26/2016  . Hyperlipidemia 08/15/2011  . PERSONAL HISTORY MALIGNANT NEOPLASM PROSTATE 07/11/2010    [facility] ,[place], [place]  08/02/2019, 5:47 PM  [facility] [address] [place], [place], [address] Phone: [phone]   Fax:  [phone]   Name: Spencer Sjogren, MD MRN: [id number] Date of Birth: December 26, 1940

## 2019-08-02 NOTE — Therapy (Signed)
[facility] [address] [place], [place], [address] Phone: [phone]   Fax:  [phone]  Physical Therapy Treatment  Patient Details  Name: Spencer Flannelly, MD MRN: [id number] Date of Birth: 1941-05-28 Referring Provider (PT): Meredith Staggers, MD   Encounter Date: 08/02/2019  PT End of Session - 08/02/19 1643    Visit Number  8    Number of Visits  12    Date for PT Re-Evaluation  08/09/19    Authorization Type  MCR/BCBS supplement, need progress note every 10th    PT Start Time  0332    PT Stop Time  0418    PT Time Calculation (min)  46 min    Equipment Utilized During Treatment  Gait belt    Activity Tolerance  Patient tolerated treatment well    Behavior During Therapy  [facility] for tasks assessed/performed       Past Medical History:  Diagnosis Date  . Allergy   . Arthritis   . Cancer ([place])   . Cataract   . Chronic kidney disease    atrophic left kidney  . Coronary atherosclerosis    a. 2012 - incidentally noted on CT scan of chest;  b. 08/2011 Myoview: normal; c. 03/2015 CPX @ [facility]: normal w/ PAC's, PVC's, and a 3 beat run of NSVT in recovery.  Marland Kitchen Heart murmur    asymptomatic function murmur.  Marland Kitchen History of echocardiogram    a. 12/2004 Echo: Nl EF, trace TR/MR.  Marland Kitchen Hx of radiation therapy   . Hyperlipidemia   . Personal history of malignant neoplasm of prostate    a. 2008 s/p prostatecomtomy-->radiation-->currently on Lupron injections.  . Sinus bradycardia   . Syncope    a. 12/2016    Past Surgical History:  Procedure Laterality Date  . PROSTATECTOMY    . ROBOT ASSISTED LAPAROSCOPIC RADICAL PROSTATECTOMY    . SHOULDER SURGERY Right   . SKIN GRAFT Left   . TONSILLECTOMY      There were no vitals filed for this visit.  Subjective Assessment - 08/02/19 1535    Subjective  No falls. Has been doing the exercises at home.    Pertinent History  TIA/CVA, hyperlipidemia, prostate cancer 2008  with prostatectomy and radiation therapy, CAD with low-dose aspirin.    Limitations  Standing;Walking;House hold activities    How long can you walk comfortably?  20 min    Diagnostic tests  MRI/CT "A 4 mm subcortical infarct is present in the right left frontaloperculum"    Patient Stated Goals  improve balance, posture, strength, core    Currently in Pain?  No/denies                       [facility] Adult PT Treatment/Exercise - 08/02/19 0001      Ambulation/Gait   Ambulation/Gait  Yes    Ambulation/Gait Assistance  5: Supervision;4: Min guard    Ambulation/Gait Assistance Details  Gait training, multiple laps around gym: tossing 2 scarves in each UE to help work on upright posture and coordination, added cognitive challenge by asking pt what he ate for thanksgiving. Pt with increased difficulty and needed occasional min guard due to narrow BOS. Exaggerated marching gait with hip/knee flexion and reciprocal arm swing overhead, pt needing frequent verbal and demonstrative cues for reciprocal pattern - needing to reset multiple times due to pt losing reciprocal pattern. 2 laps of gait with walking poles with therapist behind pt to  facilitate reciprocal arm swing - initial cueing for pattern and cues to slow down gait while performing. 2 laps with pt performing reciprocal arm swing with intermittent verbal cues to reset and restart after losing pattern - pt able to ambulate 1 lap around with reciprocal arm swing, however it became less noticeable with LUE towards end of bout of gait.     Ambulation Distance (Feet)  1000 Feet   approx. throughout session   Assistive device  None    Gait Pattern  Step-through pattern;Decreased arm swing - left;Decreased arm swing - right    Ambulation Surface  Level;Indoor      Neuro Re-ed    Neuro Re-ed Details   Standing on blue foam pad with red theraband around distal thighs, therapist calling out color to tap on floor in a semi-circle (3 color dots  on floor) 1 x 15 reps B.  Obstacle course on colorful stepping stones, with focus on SLS and slowed movement, min A for balance, 6 reps.                   PT Long Term Goals - 08/02/19 1643      PT LONG TERM GOAL #1   Title  Pt will be I and compliant with HEP. (Target for all goals 6 weeks 08/09/19)    Status  On-going      PT LONG TERM GOAL #2   Title  Pt will improve FGA to at least 27 to show improved dynamic balance and gait.    Baseline  28/30 on 07/22/19    Status  Achieved      PT LONG TERM GOAL #3   Title  Pt will be perform 6 minute walk test and write appropriate goal.    Baseline  no need to perform as he can walk 50 minutes at home with family    Status  Deferred      PT LONG TERM GOAL #4   Title  Pt will report ovreall improvement with endurance, and overall strength and will have 5/5 MMT with gross strength assesment in sitting    Status  On-going            Plan - 08/02/19 1645    Clinical Impression Statement  Focus of today's skilled session was gait training with focus on reciprocal arm swing and dynamic balance activites on compliant surfaces. Pt needing min guard/min A for obstacle course and SLS initially, with multiple reps pt with improved ability to perform with minimal LOB. Pt with improved reciprocal arm swing at end of session with initial cueing. Will continue to progress towards LTGs.    Personal Factors and Comorbidities  Comorbidity 1;Comorbidity 2;Comorbidity 3+    Comorbidities  hyperlipidemia, prostate cancer 2008 with prostatectomy and radiation therapy, CAD with low-dose aspirin.    Examination-Activity Limitations  Locomotion Level;Carry;Squat;Stairs;Lift;Stand    Examination-Participation Restrictions  Meal Prep;Cleaning;Community Activity;Driving;Laundry;Yard Work;Shop    Stability/Clinical Decision Making  Evolving/Moderate complexity    Rehab Potential  Good    PT Frequency  2x / week    PT Duration  6 weeks    PT  Treatment/Interventions  ADLs/Self Care Home Management;Aquatic Therapy;Gait training;Stair training;Functional mobility training;Therapeutic activities;Therapeutic exercise;Balance training;Neuromuscular re-education;Manual techniques    PT Next Visit Plan  elliptical for balance/endurance.  arm swing gait training. needs higher level balance challenges, overall posture, core, strength and conditioning. SLS, exercises for vestibular system for balance.    PT Home Exercise Plan  Access Code: [email or web address],  added march walking with arm swings    Consulted and Agree with Plan of Care  Patient;Family member/caregiver    Family Member Consulted  wife       Patient will benefit from skilled therapeutic intervention in order to improve the following deficits and impairments:  Abnormal gait, Decreased activity tolerance, Decreased balance, Cardiopulmonary status limiting activity, Decreased endurance, Decreased range of motion, Decreased strength, Difficulty walking, Postural dysfunction  Visit Diagnosis: Muscle weakness (generalized)  Other abnormalities of gait and mobility     Problem List Patient Active Problem List   Diagnosis Date Noted  . Neurodegenerative dementia, with behavioral disturbance ([place]) 07/02/2019  . Slow transit constipation   . Impulsive   . Sleep disturbance   . Prediabetes   . Bradycardia   . Labile blood pressure   . Seizures ([place])   . Hyponatremia 06/18/2019  . Agitation 06/18/2019  . Encephalopathy 06/18/2019  . 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 Sleep apnea, central 03/26/2019  . Transient ischemic attack (TIA) 01/26/2019  . Transient diplopia 12/20/2018  . Dysesthesia affecting both sides of body 12/20/2018  . 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 Snoring 12/20/2018  . Nocturia more than  twice per night 12/20/2018  . Insomnia disorder, with other sleep disorder, recurrent 12/20/2018  . Arthritis   . Cancer ([place])   . Elevated troponin   . Syncope 12/26/2016  . Family history of early CAD 12/26/2016  . Hyperlipidemia 08/15/2011  . PERSONAL HISTORY MALIGNANT NEOPLASM PROSTATE 07/11/2010    Arliss Journey, PT, DPT  08/02/2019, 4:50 PM  [facility] [address] [place], [place], [address] Phone: [phone]   Fax:  [phone]  Name: Aveyon Nurse, MD MRN: [id number] Date of Birth: October 04, 1940

## 2019-08-03 DIAGNOSIS — G309 Alzheimer's disease, unspecified: Secondary | ICD-10-CM | POA: Diagnosis not present

## 2019-08-03 DIAGNOSIS — R41842 Visuospatial deficit: Secondary | ICD-10-CM | POA: Diagnosis not present

## 2019-08-03 DIAGNOSIS — G3183 Dementia with Lewy bodies: Secondary | ICD-10-CM | POA: Diagnosis not present

## 2019-08-03 DIAGNOSIS — R4181 Age-related cognitive decline: Secondary | ICD-10-CM | POA: Diagnosis not present

## 2019-08-03 DIAGNOSIS — F0280 Dementia in other diseases classified elsewhere without behavioral disturbance: Secondary | ICD-10-CM | POA: Diagnosis not present

## 2019-08-03 DIAGNOSIS — R4189 Other symptoms and signs involving cognitive functions and awareness: Secondary | ICD-10-CM | POA: Diagnosis not present

## 2019-08-06 ENCOUNTER — Ambulatory Visit: Payer: Medicare Other | Attending: Physical Medicine & Rehabilitation | Admitting: Physical Therapy

## 2019-08-06 ENCOUNTER — Other Ambulatory Visit: Payer: Self-pay

## 2019-08-06 ENCOUNTER — Ambulatory Visit: Payer: Medicare Other

## 2019-08-06 ENCOUNTER — Encounter: Payer: Self-pay | Admitting: Physical Therapy

## 2019-08-06 DIAGNOSIS — R4701 Aphasia: Secondary | ICD-10-CM | POA: Insufficient documentation

## 2019-08-06 DIAGNOSIS — R41841 Cognitive communication deficit: Secondary | ICD-10-CM | POA: Insufficient documentation

## 2019-08-06 DIAGNOSIS — M6281 Muscle weakness (generalized): Secondary | ICD-10-CM | POA: Diagnosis present

## 2019-08-06 DIAGNOSIS — R2689 Other abnormalities of gait and mobility: Secondary | ICD-10-CM

## 2019-08-06 NOTE — Patient Instructions (Signed)
  Please complete the assigned speech therapy homework prior to your next session and return it to the speech therapist at your next visit.  

## 2019-08-06 NOTE — Therapy (Signed)
[facility] [address] [place], [place], [address] Phone: [phone]   Fax:  [phone]  Physical Therapy Treatment  Patient Details  Name: Spencer Loy, MD MRN: [id number] Date of Birth: [birth year] Referring Provider (PT): Meredith Staggers, MD   Encounter Date: 08/06/2019  PT End of Session - 08/06/19 1338    Visit Number  9    Number of Visits  13    Date for PT Re-Evaluation  09/05/19    Authorization Type  MCR/BCBS supplement, need progress note every 10th    PT Start Time  1236    PT Stop Time  1316    PT Time Calculation (min)  40 min    Equipment Utilized During Treatment  Gait belt    Activity Tolerance  Patient tolerated treatment well    Behavior During Therapy  [facility] for tasks assessed/performed       Past Medical History:  Diagnosis Date  . Allergy   . Arthritis   . Cancer ([place])   . Cataract   . Chronic kidney disease    atrophic left kidney  . Coronary atherosclerosis    a. 2012 - incidentally noted on CT scan of chest;  b. 08/2011 Myoview: normal; c. 03/2015 CPX @ [facility]: normal w/ PAC's, PVC's, and a 3 beat run of NSVT in recovery.  Marland Kitchen Heart murmur    asymptomatic function murmur.  Marland Kitchen History of echocardiogram    a. 12/2004 Echo: Nl EF, trace TR/MR.  Marland Kitchen Hx of radiation therapy   . Hyperlipidemia   . Personal history of malignant neoplasm of prostate    a. 2008 s/p prostatecomtomy-->radiation-->currently on Lupron injections.  . Sinus bradycardia   . Syncope    a. 12/2016    Past Surgical History:  Procedure Laterality Date  . PROSTATECTOMY    . ROBOT ASSISTED LAPAROSCOPIC RADICAL PROSTATECTOMY    . SHOULDER SURGERY Right   . SKIN GRAFT Left   . TONSILLECTOMY      There were no vitals filed for this visit.  Subjective Assessment - 08/06/19 1240    Subjective  Still feeling unsteady - has been slowing his walking down which has helped. A couple of almost falls, was just walking  down the hallway and just got off balance, did not fall. Had one episode where he was leaning too far forward and it threw him off balance.    Pertinent History  TIA/CVA, hyperlipidemia, prostate cancer 2008 with prostatectomy and radiation therapy, CAD with low-dose aspirin.    Limitations  Standing;Walking;House hold activities    How long can you walk comfortably?  20 min    Diagnostic tests  MRI/CT "A 4 mm subcortical infarct is present in the right left frontaloperculum"    Patient Stated Goals  improve balance, posture, strength, core    Currently in Pain?  No/denies         [facility] PT Assessment - 08/06/19 1252      Standardized Balance Assessment   Standardized Balance Assessment  Timed Up and Go Test;Mini-BESTest      Mini-BESTest   Sit To Stand  Normal: Comes to stand without use of hands and stabilizes independently.    Rise to Toes  Normal: Stable for 3 s with maximum height.    Stand on one leg (left)  Normal: 20 s.    Stand on one leg (right)  Normal: 20 s.    Stand on one leg - lowest score  2  Compensatory Stepping Correction - Forward  Normal: Recovers independently with a single, large step (second realignement is allowed).    Compensatory Stepping Correction - Backward  Moderate: More than one step is required to recover equilibrium    Compensatory Stepping Correction - Left Lateral  Moderate: Several steps to recover equilibrium    Compensatory Stepping Correction - Right Lateral  Moderate: Several steps to recover equilibrium    Stepping Corredtion Lateral - lowest score  1    Stance - Feet together, eyes open, firm surface   Normal: 30s    Stance - Feet together, eyes closed, foam surface   Normal: 30s    Incline - Eyes Closed  Normal: Stands independently 30s and aligns with gravity    Change in Gait Speed  Normal: Significantly changes walkling speed without imbalance    Walk with head turns - Horizontal  Normal: performs head turns with no change in gait speed  and good balance    Walk with pivot turns  Normal: Turns with feet close FAST (< 3 steps) with good balance.    Step over obstacles  Normal: Able to step over box with minimal change of gait speed and with good balance.    Timed UP & GO with Dual Task  Normal: No noticeable change in sitting, standing or walking while backward counting when compared to TUG without    Mini-BEST total score  26      Timed Up and Go Test   Normal TUG (seconds)  10.82    Manual TUG (seconds)  10.53    Cognitive TUG (seconds)  11.05      Functional Gait  Assessment   Gait assessed   Yes    Gait Level Surface  Walks 20 ft in less than 5.5 sec, no assistive devices, good speed, no evidence for imbalance, normal gait pattern, deviates no more than 6 in outside of the 12 in walkway width.    Change in Gait Speed  Able to smoothly change walking speed without loss of balance or gait deviation. Deviate no more than 6 in outside of the 12 in walkway width.    Gait with Horizontal Head Turns  Performs head turns smoothly with no change in gait. Deviates no more than 6 in outside 12 in walkway width    Gait with Vertical Head Turns  Performs head turns with no change in gait. Deviates no more than 6 in outside 12 in walkway width.    Gait and Pivot Turn  Pivot turns safely within 3 sec and stops quickly with no loss of balance.    Step Over Obstacle  Is able to step over 2 stacked shoe boxes taped together (9 in total height) without changing gait speed. No evidence of imbalance.    Gait with Narrow Base of Support  Is able to ambulate for 10 steps heel to toe with no staggering.    Gait with Eyes Closed  Walks 20 ft, slow speed, abnormal gait pattern, evidence for imbalance, deviates 10-15 in outside 12 in walkway width. Requires more than 9 sec to ambulate 20 ft.    Ambulating Backwards  Walks 20 ft, no assistive devices, good speed, no evidence for imbalance, normal gait    Steps  Alternating feet, no rail.    Total  Score  28    FGA comment:  28/30  PT Education - 08/06/19 1338    Education Details  clinical findings    Person(s) Educated  Patient;Spouse    Methods  Explanation;Demonstration    Comprehension  Verbalized understanding          PT Long Term Goals - 08/06/19 1246      PT LONG TERM GOAL #1   Title  Pt will be I and compliant with HEP. (Target for all goals 6 weeks 08/09/19)    Status  On-going      PT LONG TERM GOAL #2   Title  Pt will improve FGA to at least 27 to show improved dynamic balance and gait.    Baseline  28/30 on 07/22/19    Status  Achieved      PT LONG TERM GOAL #3   Title  Pt will be perform 6 minute walk test and write appropriate goal.    Baseline  no need to perform as he can walk 50 minutes at home with family    Status  Achieved      PT LONG TERM GOAL #4   Title  Pt will report ovreall improvement with endurance, and overall strength and will have 5/5 MMT with gross strength assesment in sitting    Baseline  5/5 strength on 08/06/19    Status  Achieved       Revised LTGs for re-cert:      PT Long Term Goals - 08/06/19 1344      PT LONG TERM GOAL #1   Title  Pt will be I and compliant with HEP. (Target for all goals 08/20/19)    Time  2    Period  Weeks    Status  On-going    Target Date  08/20/19      PT LONG TERM GOAL #2   Title  Pt will improve FGA to at least 30 to show improved dynamic balance and gait.    Baseline  28/30 on 07/22/19    Status  Revised      PT LONG TERM GOAL #3   Title  Patient will improve miniBEST score to a 28/28 to demo improved stepping strategy balance reaction.    Baseline  26/28 on 08/06/19    Status  New        Plan - 08/06/19 1343    Clinical Impression Statement  Focus of today's skilled session was assessing pt's LTGs for re-cert. Pt's HEP goal is on-going, and pt has achieved remainder of LTGs. Pt scored a 28/30 today on the FGA with increased difficulty  performing gait with eyes closed with increased veering towards the R. Performed the miniBEST and pt scored a 26/28  with increased difficulty with lateral and posterior stepping strategy, taking multiple steps to maintain his balance and needed min guard. Pt continues to walk with a more forward flexed posture and decreased arm swing, needs frequent verbal and demonstrative cueing. Pt will continue to benefit from skilled PT to address these balance deficits to decrease his risk of falls as well as upgrade HEP so pt can continue to build upon functional gains made in therapy. Revised LTGs as appropriate. Will re-cert for 2x week for 2 more weeks to continue POC.    Personal Factors and Comorbidities  Comorbidity 1;Comorbidity 2;Comorbidity 3+    Comorbidities  hyperlipidemia, prostate cancer 2008 with prostatectomy and radiation therapy, CAD with low-dose aspirin.    Examination-Activity Limitations  Locomotion Level;Carry;Squat;Stairs;Lift;Stand    Examination-Participation Restrictions  Meal Prep;Cleaning;Community Activity;Driving;Laundry;[employer]  Work;Shop    Stability/Clinical Decision Making  Evolving/Moderate complexity    Rehab Potential  Good    PT Frequency  2x / week    PT Duration  6 weeks    PT Treatment/Interventions  ADLs/Self Care Home Management;Aquatic Therapy;Gait training;Stair training;Functional mobility training;Therapeutic activities;Therapeutic exercise;Balance training;Neuromuscular re-education;Manual techniques    PT Next Visit Plan  continue arm swing gait training/upright posture. gait with eyes closed, posterior and lateral stepping strategies. eyes closed balance on thicker foam, upgrade HEP as needed.    PT Home Exercise Plan  Access Code: [email or web address], added march walking with arm swings    Consulted and Agree with Plan of Care  Patient;Family member/caregiver    Family Member Consulted  wife       Patient will benefit from skilled therapeutic intervention in order to  improve the following deficits and impairments:  Abnormal gait, Decreased activity tolerance, Decreased balance, Cardiopulmonary status limiting activity, Decreased endurance, Decreased range of motion, Decreased strength, Difficulty walking, Postural dysfunction  Visit Diagnosis: Muscle weakness (generalized)  Other abnormalities of gait and mobility     Problem List Patient Active Problem List   Diagnosis Date Noted  . Neurodegenerative dementia, with behavioral disturbance ([place]) 07/02/2019  . Slow transit constipation   . Impulsive   . Sleep disturbance   . Prediabetes   . Bradycardia   . Labile blood pressure   . Seizures ([place])   . Hyponatremia 06/18/2019  . Agitation 06/18/2019  . Encephalopathy 06/18/2019  . 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 Sleep apnea, central 03/26/2019  . Transient ischemic attack (TIA) 01/26/2019  . Transient diplopia 12/20/2018  . Dysesthesia affecting both sides of body 12/20/2018  . 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 Snoring 12/20/2018  . Nocturia more than twice per night 12/20/2018  . Insomnia disorder, with other sleep disorder, recurrent 12/20/2018  . Arthritis   . Cancer ([place])   . Elevated troponin   . Syncope 12/26/2016  . Family history of early CAD 12/26/2016  . Hyperlipidemia 08/15/2011  . PERSONAL HISTORY MALIGNANT NEOPLASM PROSTATE 07/11/2010    Arliss Journey, PT, DPT  08/06/2019, 1:44 PM  [facility] [address] [place], [place], [address] Phone: [phone]   Fax:  [phone]  Name: Spencer Baldez, MD MRN: [id number] Date of Birth: 1941-02-17

## 2019-08-06 NOTE — Therapy (Addendum)
[facility] [address] 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 Allergy   . Arthritis   . Cancer ([place])   . Cataract   . Chronic kidney disease    atrophic left kidney  . Coronary atherosclerosis    a. 2012 - incidentally noted on CT scan of chest;  b. 08/2011 Myoview: normal; c. 03/2015 CPX @ [facility]: normal w/ PAC's, PVC's, and a 3 beat run of NSVT in recovery.  Marland Kitchen Heart murmur    asymptomatic function murmur.  Marland Kitchen History of echocardiogram    a. 12/2004 Echo: Nl EF, trace TR/MR.  Marland Kitchen Hx of radiation therapy   . Hyperlipidemia   . Personal history of malignant neoplasm of prostate    a. 2008 s/p prostatecomtomy-->radiation-->currently on Lupron injections.  . Sinus bradycardia   . Syncope    a. 12/2016    Past Surgical History:  Procedure Laterality Date  . PROSTATECTOMY    . ROBOT ASSISTED LAPAROSCOPIC RADICAL PROSTATECTOMY    . SHOULDER SURGERY Right   . SKIN GRAFT Left   . TONSILLECTOMY      There were no vitals filed for this visit.  Subjective Assessment - 08/06/19 1239    Subjective  Pt lagged behind PT and wife on the way out of ST, waited for SLP to walk near him and stated quietly, "How's your lesion? Marland KitchenMarland KitchenMarland KitchenThe lesion on your lip that wouldn't stop bleeding?"            ADULT SLP TREATMENT - 08/06/19 1155      General Information   Behavior/Cognition   Alert;Cooperative;Pleasant mood      Treatment Provided   Treatment provided  Cognitive-Linquistic      Cognitive-Linquistic Treatment   Treatment focused on  Cognition;Patient/family/caregiver education    Skilled Treatment  Due to wife stating they did not bring calendar today "to see what else (SLP) would work on other than the calendar" SLP reiterated need for pt work on attention and awareness and these could be done in a functional manner with pt's memory binder. SLP further shared that pt's best way to recall/remember was by procedural memory, which is reinforced when pt works with calendar/memory binder. SLP then worked with pt on attention task looking at menu and figuring prices of meals. Pt with min extra time needed and occasional cues for ordered movement through the menu top->bottom rather than searching for target item/price. Pt unaware of his current meds so SLP suggested wife present his 2 meds and 2 supplements with written names of each, with each presentation so that pt can begin to have some memory via procedural memory of and with his meds.Lasly, wife told SLP that she will often tell pt what is on the agenda rather than ask him to look at  his calendar - wife stated she needed to let pt look at calendar more often, hearing that procedural memory was most salient for pt at this time.Pt req'd mod cues to tell SLP where he went for lunch yesterday as pt cont to tell SLP that it was in the hospitall.      Assessment / Recommendations / Plan   Plan  Continue with current plan of care      Progression Toward Goals   Progression toward goals  Progressing toward goals   educating wife re: allowing pt to look at calendar      SLP Education - 08/06/19 1250    Education Details  procedural memory most salient for pt at this time, assisting pt to recall meds (written out each administration time)    Person(s) Educated  Patient;Spouse    Methods  Explanation    Comprehension   Verbalized understanding       SLP Short Term Goals - 08/06/19 1203      SLP SHORT TERM GOAL #1   Title  pt will demonstrate intellectual awareness and tell SLP 3 non-physical deficits via multiple choice, direct questioning, or yes/no questions x3 sessions    Status  Partially Met      SLP SHORT TERM GOAL #2   Title  pt will demo emergent awareness (error awareness) in functional simple-mod complex tasks 90% of the time with modified indpendence (double checking for errors)    Status  Not Met      SLP SHORT TERM GOAL #3   Title  pt will complete cognitive linguistic testing in first 1-2 sessions    Status  Achieved       SLP Long Term Goals - 08/06/19 1157      SLP LONG TERM GOAL #1   Title  pt will successfully use memory system/strategies to access appointment or social schedule, daily tasks/events, or planned tasks with occasional mod cues and extra time x3 sessions    Time  4    Period  Weeks    Status  Revised   "with occasional mod cues" added     SLP LONG TERM GOAL #2   Title  pt will demo emergent awareness when completing simple cognitive linguistic tasks x 5 sessions    Time  4    Period  Weeks    Status  Revised   "anticiapatory" to "emergent", "mod complex-complex" to "sipmle"     SLP LONG TERM GOAL #3   Title  pt will demo WNL alternating attention between two simple tasks x3 sessions    Time  5    Period  Weeks    Status  Revised   "mod complex" to "simple     SLP LONG TERM GOAL #4   Title  pt will use compensations for attention skills (reported by pt or wife, or during therapy) with cues to do so x 5 dates    Time  4    Period  Weeks    Status  Revised   added "with cues to do so"     SLP LONG TERM GOAL #5   Title  pt will use anomia compensations (if needed, after further informal or formal assessment) in mod comples conversation in 3 sessions    Time  4    Period  Weeks    Status  On-going       Plan - 08/06/19 1251    Clinical Impression  Statement  Pt continues to present  with significant cognitive communication deficits at least in the areas of memory and awareness, and higher level areas such as executive function and high level problem solving. Pt req'd consistent cues for use of daily journal which pt can use for information about recent events. Pt presents more like a pt with diffuse injury rather than focal injury. SLP considering focusing mainly on compensatory measures for memory, and believe pt will benefit from skilled ST targeting this,  for caregiver education.    Speech Therapy Frequency  2x / week    Duration  --   8 weeks or 17 visits   Treatment/Interventions  Functional tasks;SLP instruction and feedback;Compensatory strategies;Patient/family education;Internal/external aids;Cognitive reorganization;Cueing hierarchy;Language facilitation;Environmental controls    Potential to Achieve Goals  Good    Potential Considerations  Severity of impairments   ? diagnosis      Patient will benefit from skilled therapeutic intervention in order to improve the following deficits and impairments:   Cognitive communication deficit  Aphasia   Speech Therapy Progress Note  Dates of Reporting Period: 06-29-19 to present  Subjective Statement: Pt has been seen for 10 sessions focusing on pt's cogntive communication deficits.  Objective Measurements: Pt now has a memory system that he uses with cues to do so.  Goal Update: see above. Pt's deficits appear more global/diffuse than focal.   Plan: cont for at least 5-7 more sessions  Reason Skilled Services are Required: Pt and wife still learning how to use memory compensations. SLP not convinced pt has reached max rehab potential.    Problem List Patient Active Problem List   Diagnosis Date Noted  . Neurodegenerative dementia, with behavioral disturbance ([place]) 07/02/2019  . Slow transit constipation   . Impulsive   . Sleep disturbance   . Prediabetes   . Bradycardia    . Labile blood pressure   . Seizures ([place])   . Hyponatremia 06/18/2019  . Agitation 06/18/2019  . Encephalopathy 06/18/2019  . 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 Sleep apnea, central 03/26/2019  . Transient ischemic attack (TIA) 01/26/2019  . Transient diplopia 12/20/2018  . Dysesthesia affecting both sides of body 12/20/2018  . 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 Snoring 12/20/2018  . Nocturia more than twice per night 12/20/2018  . Insomnia disorder, with other sleep disorder, recurrent 12/20/2018  . Arthritis   . Cancer ([place])   . Elevated troponin   . Syncope 12/26/2016  . Family history of early CAD 12/26/2016  . Hyperlipidemia 08/15/2011  . PERSONAL HISTORY MALIGNANT NEOPLASM PROSTATE 07/11/2010    [facility] ,[place], CCC-SLP  08/06/2019, 12:53 PM  [facility] [address] [place], [place], [address] Phone: [phone]   Fax:  [phone]   Name: Spencer Heims, MD MRN: [id number] Date of Birth: 1941-06-20

## 2019-08-09 ENCOUNTER — Ambulatory Visit (INDEPENDENT_AMBULATORY_CARE_PROVIDER_SITE_OTHER): Payer: Medicare Other | Admitting: Neurology

## 2019-08-09 ENCOUNTER — Other Ambulatory Visit: Payer: Self-pay

## 2019-08-09 ENCOUNTER — Encounter: Payer: Self-pay | Admitting: Neurology

## 2019-08-09 VITALS — BP 142/94 | HR 58 | Ht 69.0 in | Wt 152.0 lb

## 2019-08-09 DIAGNOSIS — F0391 Unspecified dementia with behavioral disturbance: Secondary | ICD-10-CM | POA: Diagnosis not present

## 2019-08-09 DIAGNOSIS — F0789 Other personality and behavioral disorders due to known physiological condition: Secondary | ICD-10-CM | POA: Diagnosis not present

## 2019-08-09 DIAGNOSIS — F09 Unspecified mental disorder due to known physiological condition: Secondary | ICD-10-CM

## 2019-08-09 DIAGNOSIS — F03918 Unspecified dementia, unspecified severity, with other behavioral disturbance: Secondary | ICD-10-CM

## 2019-08-09 DIAGNOSIS — I639 Cerebral infarction, unspecified: Secondary | ICD-10-CM | POA: Diagnosis not present

## 2019-08-09 DIAGNOSIS — G4731 Primary central sleep apnea: Secondary | ICD-10-CM | POA: Diagnosis not present

## 2019-08-09 NOTE — Patient Instructions (Signed)
Caregiver Guide  Alzheimer disease causes a person to lose the ability to remember things and make decisions. A person who has Alzheimer disease may not be able to take care of himself or herself. He or she may need help with simple tasks. The tips below can help you care for the person. What kind of changes does this condition cause? This condition makes a person:  Forget things.  Feel confused.  Act differently.  Have different moods. These things get worse with time. Tips to help with symptoms  Be calm and patient.  Respond with a simple, short answer.  Avoid correcting the person in a negative way.  Try not to take things personally, even if the person forgets your name.  Do not argue with the person. This may make the person more upset. Tips to lessen frustration  Make appointments and do daily tasks when the person is at his or her best.  Take your time. Simple tasks may take longer. Allow plenty of time to complete tasks.  Limit choices for the person.  Involve the person in what you are doing.  Keep a daily routine.  Avoid new or crowded places, if possible.  Use simple words, short sentences, and a calm voice. Only give one direction at a time.  Buy clothes and shoes that are easy to put on and take off.  Organize medicines in a pillbox for each day of the week.  Keep a calendar in a central location to remind the person of meetings or other activities.  Let people help if they offer. Take a break when needed. Tips to prevent injury  Keep floors clear. Remove rugs, magazine racks, and floor lamps.  Keep hallways well-lit.  Put a handrail and non-slip mat in the bathtub or shower.  Put childproof locks on cabinets that have dangerous items in them. These items include medicine, alcohol, guns, toxic cleaning items, sharp tools, matches, and lighters.  Put locks on doors where the person cannot see or reach them. This helps the person to not wander out  of the house and get lost.  Be prepared for emergencies. Keep a list of emergency phone numbers and addresses close by.  Bracelets may be worn that track location and identify the person as having memory problems. This should be worn at all times for safety. Tips for the future  Discuss financial and legal planning early. People with this disease have trouble managing their money as the disease gets worse. Get help from a professional.  Talk about advance directives, safety, and daily care. Take these steps: ? Create a living will and choose a power of attorney. This is someone who can make decisions for the person with Alzheimer disease when he or she can no longer do so. ? Discuss driving safety and when to stop driving. The person's doctor can help with this. ? If the person lives alone, make sure he or she is safe. Some people need extra help at home. Other people need more care at a nursing home or care center. Where to find support You can find support by joining a support group near you. Some benefits of joining a support group include:  Learning ways to manage stress.  Sharing experiences with others.  Getting emotional comfort and support.  Learning about caregiving as the disease progresses.  Knowing what community resources are available and making use of them. Where to find more information  Alzheimer's Association: [employer] Contact a doctor if:  The  person has a fever.  The person has a sudden behavior change that does not get better with calming strategies.  The person is not able to take care of himself or herself at home.  The person threatens you or anyone else, including himself or herself.  You are no longer able to care for the person. Summary  Alzheimer disease causes a person to forget things and to be confused.  A person who has this condition may not be able to take care of himself or herself.  Take steps to keep the person from getting hurt.  Plan for future care.  You can find support by joining a support group near you. This information is not intended to replace advice given to you by your health care provider. Make sure you discuss any questions you have with your health care provider. Document Released: 11/11/2011 Document Revised: 12/08/2018 Document Reviewed: 08/14/2017 Elsevier Patient Education  2020 [place]. Management of Memory Problems  There are some general things you can do to help manage your memory problems.  Your memory may not in fact recover, but by using techniques and strategies you will be able to manage your memory difficulties better.  1)  Establish a routine.  Try to establish and then stick to a regular routine.  By doing this, you will get used to what to expect and you will reduce the need to rely on your memory.  Also, try to do things at the same time of day, such as taking your medication or checking your calendar first thing in the morning.  Think about think that you can do as a part of a regular routine and make a list.  Then enter them into a daily planner to remind you.  This will help you establish a routine.  2)  Organize your environment.  Organize your environment so that it is uncluttered.  Decrease visual stimulation.  Place everyday items such as keys or cell phone in the same place every day (ie.  Basket next to front door)  Use post it notes with a brief message to yourself (ie. Turn off light, lock the door)  Use labels to indicate where things go (ie. Which cupboards are for food, dishes, etc.)  Keep a notepad and pen by the telephone to take messages  3)  Memory Aids  A diary or journal/notebook/daily planner  Making a list (shopping list, chore list, to do list that needs to be done)  Using an alarm as a reminder (kitchen timer or cell phone alarm)  Using cell phone to store information (Notes, Calendar, Reminders)  Calendar/White board placed in a prominent  position  Post-it notes  In order for memory aids to be useful, you need to have good habits.  It's no good remembering to make a note in your journal if you don't remember to look in it.  Try setting aside a certain time of day to look in journal.  4)  Improving mood and managing fatigue.  There may be other factors that contribute to memory difficulties.  Factors, such as anxiety, depression and tiredness can affect memory.  Regular gentle exercise can help improve your mood and give you more energy.  Simple relaxation techniques may help relieve symptoms of anxiety  Try to get back to completing activities or hobbies you enjoyed doing in the past.  Learn to pace yourself through activities to decrease fatigue.  Find out about some local support groups where you can share experiences  with others.  Try and achieve 7-8 hours of sleep at night.

## 2019-08-09 NOTE — Progress Notes (Signed)
SLEEP MEDICINE CLINIC    Provider:  Larey Seat, MD  Primary Care Physician:  [name], MD [place] [address]     Referring Provider: [name], [address] [place],  [place] [address]          Chief Complaint according to patient   Patient presents with:     New Patient (Initial Visit)     Rv after cognitive testing and CSF testing. Had already PET scan, and blood tests. We are waiting for alpha-lipoprotein and CSF tau.        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 @ [facility].  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 No quick test positive for prion disease, MRI was showing notable atropy in occipital visual cortex,  He has been weaning off Keppra- which made him restless. This was started by cone-hospitalist. He is on Aricept, increased to 10 mg- and Zoloft. He feels better on this combination. He remains off Zetia/ off lupron, and he is sleeping better on BiPAP. He uses hearing aids, he appears meek and passive, his wife directs the conversation. MOCA test last week - significantly reduced in comparison to June . He had periods of agitation while in hospital.  He uses ASV 26/30 days. Airfit F 20 airfit  FFM.  Reportedly drools- a side effect of cholinergic medication. Has a greatly increased appetite for the last 6 weeks sicne off Lupron. Balance has improved with neuro-rehab and PT/ Speech. He exercises more frequently by having PT. Mild tremor, fine amplitude- in a patient with previous carpal tunnel.    He has continued to have great navigational difficulties and is expected to progress.  He has difficulties  with visual memory-  Can remember what he wants but not recall how to get there (in his mind).   06-08-2019. He has had confusion- more over the last 6 weeks, not related to vivid dreams.  He still j has reading difficulties, visio= spatial problems. He has difficulties imaging/ projecting a route, and some areas look unfamiliar.  Memory for fanacial records has declined since July.  He is more tired than ever, he had several times enuresis.  he has less facial expression. Feels cold a lot, neighbors mentioned a puffy face.   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 The clinical history would also fit that diagnosis.  PET scan did not show atrophy or metabolic decreease in that region of the brain .    Revisit 08/09/2019 , PCP is Dr Joylene Draft. Chief concern according to patient :  transient confusional episodes.  [name]  reports increasing memory problems, feeling these are related to directional problems, visio spatial- orientation to space, affecting his driving, his reading, his judging distance. . Recall of names and events are affected. He had for the first time ever trouble to file their tax returns. Multitasking is much harder now.  His wife stated he has trouble to recall the folders, the location of the folders and  how to find them. 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  The last 6 weeks have been marked by inability to find simple, common objects in the house.  He had gotten lost and is not driving now- but he hadn't been driving alone since May 31 anyway  and not outside residential area. His wife was always with him.   01-31-2019 He had a TIA like event that day.  Central apnea is currently well controlled, also he had some exeptional nights.  Myasthenia negative - no explanation for diplopia.  I  strongly suspect a neurodegenerative process. The confusion is not related to central apnea, but central apnea and confusion are part of the underlying process.      03-29-2019, RV with 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 He  has a  has a past medical history of Allergy, Arthritis, Cancer ([place]), Cataract, Chronic kidney disease, Coronary atherosclerosis, Heart murmur, History of echocardiogram, radiation therapy, Hyperlipidemia, Personal history of malignant neoplasm of prostate, Sinus bradycardia, and Syncope..   I described in my virtual visit that since a trip to [place] in December / January-2020 there was a notable change in personality and cognitive capability that his family and especially his spouse have noted.  The patient describes his recent health history since April 2018 with a printed journal that his wife brought to the visit.  He had an upper respiratory infection in late April 2018 developed cervical neuralgia and woke out of sleep with this pain seated.  Had a very severe shooting pain up the right side of his head followed by a syncopal episode.  That night the couple went to the [facility] emergency room and he was undergoing cardiac work-up which remained negative.  He was suspected to have had a vasovagal episode may be in relation to pain.   Over the year 2019 there were a few times but an event was mentioned from the prior year to [name] struggle to remember this was only noted and remarkable because he had such an exacting memory.   By late fall 2019 he started to use sometimes the wrong word for example he replaced the word car and truck.  Usually he corrected himself.  He had an onset of  transient double vision, in winter 2019 this began to occur daily and became more obvious but it did not impair his driving or daily  activity.  It usually affected him when he was looking at a small object close up or at the person far away.  The family noted no changes in personality demeanor, his sense of humor, his memory and his ability to function.  The couple went on a trip to [place] in [place] between December 26 and September 12, 2018 and enjoyed the trip very much the journey home however took 30 hours with very little sleep and was therefore very taxing.   The following day, January 12, [name] awoke and noted a visual field change with the left side only.  Localized split image in the left visual field of the left eye was a diagnosis the episode lasted 10 minutes during which she could not read.  He saw [name] who suspected that it may have been a TIA or painless migraine and started [name] on 400 mg magnesium per day.  On January 16s he woke up in the  early morning with a 20-minute episode of confusion.  He had some trouble recalling names of friends his thinking was not congruent.  This cleared spontaneously but the couple went to the [facility] emergency room where he underwent a CT scan and MRI and an MRA of the head with a diagnosis of chronic small vessel ischemia and generalized brain atrophy no focal stroke was seen.  On January 17 he saw [name], his primary internist who also thought that it was a TIA versus migraine and prescribed baby aspirin daily.  Somewhere in that time he had an onset of itching at night causing him difficulty sleeping.  He tried antihistamines and tried Aveeno lotion.  The itchiness remained for several months.  By February 2020 the couple left for a vacation of 3 weeks with friends  playing golf, walking, playing bridge -there was a lot of interaction stimulation. During the second evening  on vacation there was a transient moment of confusion while [name] was driving he was not sure which way to turn.  For the residual weeks he showed some flat affect, was a little less talkative and  less jovial than normal.  By March 2020 and back in [place] he had more episodes of transient difficulty with orientation.  This all occurred in well known locations or in route to well-known places. [name] decided to stop his statin medication. He also wondered if magnesium cause itching, and discontinued that supplement.  He continued to be confused on directions while driving before initiating a car ride.  He had a hard time to admission how to drive to another location.  Because of the call with pandemic and shutdown he had risen very little until now.  He drove 2 months from [facility] to [place] without any difficulties on 13 April.  On 17 April he had our telemedicine appointment, which was meant to address sleep primarily.  The sleep study followed on Jan 18, 2019 and was performed by a home sleep test as the lab remained closed at the time.   He was found to have very severe apnea at an AHI of 49.3/h and a higher RDI indicating the presence of snoring.  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   In the meantime we wanted to see if CPAP might correct his apnea and he was given an auto titration capable CPAP device between 5 and 16 cmH2O pressure with 3 cm expiratory pressure relief and heated humidity.  He continued to have high apnea and hypopnea indices between AHI of 30 and 10/h.  The patient drove to and from [facility] for 3 hours each way without difficulty in late May 2 weekends in a row.  On 31 May he woke up with some confusion and transient numbness of the left hand went to the [facility] ED but had to go along.  EKG was unremarkable he was there for over 8 hours and MRI showed no change in the atrophy or small vessel ischemia from January and this time was obtained with contrast.   By 02-09-2019 he presented to the  [facility] neurology and memory clinic and I have a copy of this consult available.     Before we could open the lab again I switched him to BiPAP with a maximum inspiratory pressure of 14 minimum  expiratory pressure of 4 and a pressure support of 5 cm but he remained with a high apnea index, all apneas arising under the therapy for central apneas again with an AHI of 17.3/h.  Also this AHI was lower than the baseline AHI it was not a treatment for the long-term as it was insufficient.  On 25 March 2019 he returned for a titration to positive airway pressure during which it was clearly documented that he failed CPAP but he did much better with BiPAP 12/7 cmH2O with a rescue breath setting at 10/min.  The machine was ordered but was not immediately available to the patient and actually was supposed to be delivered on Monday, 05 April 2019    Family medical Rachelle Hora history: No other family member on CPAP with OSA, dementia , or degenerative disorders.   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  Family status is married , with adult children,and  grandchildren. The patient currently likes to golf, travel , to drive.     He was then asked to return for a titration to positive airway pressure on 25 March 2019 he used an F 30 on a full facemask and be started at 10/5 cm water pressure.  The patient developed some central apneas but the number was actively decreasing to an AHI of only 2.4 at the final setting.  With an ST of 10 so-called rescue breaths the patient at 12/7 cmH2O pressure reached a reduction of the AHI to 0.5/h.  Sleep efficiency was 84% which is very good for titration study, most of the arousals that still occurred during the study were actually related to spontaneous arousal or 2 limb movements.  It was notable that PLM's also continued into REM sleep rapid eye movement sleep.       Review of Systems: Out of a complete 14 system review, the patient  complains of only the following symptoms, and all other reviewed systems are negative.:  Snoring, nocturia related fragmented sleep. apnea   How likely are you to doze in the following situations: 0 = not likely, 1 = slight chance, 2 = moderate chance, 3 = high chance   Sitting and Reading? Watching Television? Sitting inactive in a public place (theater or meeting)? As a passenger in a car for an hour without a break? Lying down in the afternoon when circumstances permit? Sitting and talking to someone? Sitting quietly after lunch without alcohol? In a car, while stopped for a few minutes in traffic?   Total = 8/ 24 points   FSS endorsed at 28/ 63 points.     Social History   Socioeconomic History   Marital status: Married    Spouse name: Not on file   Number of children: Not on file   Years of education: Not on file   Highest education level: Not on file  Occupational History   Occupation: Retired Engineer, drilling  Social Designer, fashion/clothing strain: Not on file   Food insecurity    Worry: Not on file    Inability: Not on file   Transportation needs    Medical: Not on file    Non-medical: Not on file  Tobacco Use   Smoking status: Never Smoker   Smokeless tobacco: Never Used  Substance and Sexual Activity   Alcohol use: Yes    Alcohol/week: 1.0 standard drinks    Types: 1 Cans of beer per week    Comment: occ  Drug use: No   Sexual activity: Not on file  Lifestyle   Physical activity    Days per week: Not on file    Minutes per session: Not on file   Stress: Not on file  Relationships   Social connections    Talks on phone: Not on file    Gets together: Not on file    Attends religious service: Not on file    Active member of club or organization: Not on file    Attends meetings of clubs or organizations: Not on file    Relationship status: Not on file  Other Topics Concern   Not on file  Social History Narrative   Lives in [place] with  wife.  Exercises regularly - elliptical, play golf.    Family History  Problem Relation Age of Onset   Hypertension Mother    Thyroid disease Mother    Stroke Father    Coronary artery disease Father    Colon cancer Neg Hx    Esophageal cancer Neg Hx    Pancreatic cancer Neg Hx    Rectal cancer Neg Hx    Stomach cancer Neg Hx     Past Medical History:  Diagnosis Date   Allergy    Arthritis    Cancer ([place])    Cataract    Chronic kidney disease    atrophic left kidney   Coronary atherosclerosis    a. 2012 - incidentally noted on CT scan of chest;  b. 08/2011 Myoview: normal; c. 03/2015 CPX @ [facility]: normal w/ PAC's, PVC's, and a 3 beat run of NSVT in recovery.   Heart murmur    asymptomatic function murmur.   History of echocardiogram    a. 12/2004 Echo: Nl EF, trace TR/MR.   Hx of radiation therapy    Hyperlipidemia    Personal history of malignant neoplasm of prostate    a. 2008 s/p prostatecomtomy-->radiation-->currently on Lupron injections.   Sinus bradycardia    Syncope    a. 12/2016    Past Surgical History:  Procedure Laterality Date   PROSTATECTOMY     ROBOT ASSISTED LAPAROSCOPIC RADICAL PROSTATECTOMY     SHOULDER SURGERY Right    SKIN GRAFT Left    TONSILLECTOMY       Current Outpatient Medications on File Prior to Visit  Medication Sig Dispense Refill   aspirin EC 81 MG tablet Take 81 mg by mouth daily.     donepezil (ARICEPT) 5 MG tablet Start with 1/2 pill at night for 1 month than a full pill for 1 month the 1.5 pills for one month then switch to one 10 mg pill     Misc Natural Products (OSTEO BI-FLEX ADV JOINT SHIELD PO) Take 1 tablet by mouth daily.      Multiple Vitamins-Minerals (CENTRUM SILVER PO) Take 1 tablet by mouth daily.     sertraline (ZOLOFT) 50 MG tablet START WITH 1/2 PILL PER DAY FOR TWO WEEKS THEN INCREASE TO A FULL PILL     No current facility-administered medications on file prior to visit.      Allergies  Allergen Reactions   Atorvastatin Other (See Comments)    Caused confusion    Physical exam:  Today's Vitals   08/09/19 1410  BP: (!) 142/94  Pulse: (!) 58  Weight: 152 lb (68.9 kg)  Height: 5\' 9"  (1.753 m)   Body mass index is 22.45 kg/m.   Wt Readings from Last 3 Encounters:  08/09/19 152 lb (68.9  kg)  06/18/19 156 lb (70.8 kg)  06/15/19 153 lb 3.5 oz (69.5 kg)     Ht Readings from Last 3 Encounters:  08/09/19 5\' 9"  (1.753 m)  06/18/19 5\' 9"  (1.753 m)  06/15/19 5\' 9"  (1.753 m)      General: The patient is awake, alert and appears not in acute distress. The patient is well groomed. Head: Normocephalic, atraumatic. Neck is supple. Mallampati 3,  neck circumference:15 inches . Nasal airflow  patent.  Retrognathia is not seen.  Dental status: intact. Cardiovascular:  Regular rate and cardiac rhythm by pulse, without distended neck veins. Respiratory: Lungs are clear to auscultation, no wheezing.  Skin:  Without evidence of ankle edema, or rash. Trunk: The patient's posture is erect.   Neurologic exam : The patient is awake and alert, oriented to place and time.   Memory subjective described as intact.   Montreal Cognitive Assessment Blind 06/08/2019 03/29/2019  Attention: Read list of digits (0/2) 2 2  Attention: Read list of letters (0/1) 1 1  Attention: Serial 7 subtraction starting at 100 (0/3) 3 3  Language: Repeat phrase (0/2) 2 2  Language : Fluency (0/1) 1 1  Abstraction (0/2) 2 2  Delayed Recall (0/5) 1 3  Orientation (0/6) 6 6  Total 25 20    MMSE 29-30 on 06-08-2019  Attention span & concentration ability appeared only slightly impaired  Speech is fluent, without dysarthria, dysphonia or aphasia.  Mood and affect are appropriate.   Cranial nerves: no loss of smell or taste reported  Pupils are equal and briskly reactive to light. Funduscopic exam deferred. .  Extraocular movements in vertical and horizontal planes were intact and  without nystagmus. No Diplopia. Visual fields by finger perimetry are intact. Hearing was intact to soft voice and finger rubbing.    Facial sensation intact to fine touch. Less expression- the patient's facial expression is reduced.   Facial motor strength is symmetric and tongue and uvula move midline.  Neck ROM : rotation, tilt and flexion extension were normal for age and shoulder shrug was symmetrical.    Motor exam:  Symmetric bulk,- increased tone in biceps with mild cog-wheeling and a slight restriction in shoulder elevation.   symmetric grip strength- no carpaltunnel signs.   Sensory:  Fine touch, pinprick and vibration were normal.  Proprioception tested in the upper extremities was normal.   Coordination: Rapid alternating movements in the fingers/hands were of normal speed.  The Finger-to-nose maneuver was intact without evidence of ataxia, dysmetria or tremor. Notable was a mild tremor when writing. This was also seen on the Archimedes' spiral drawing and on the image copy part of his [place].   Gait and station: Patient could rise unassisted from a seated position, walked without assistive device. Stance is of normal width/ base and the patient turned with 4 steps, is turns were less steady.  He walks fast with a slight stooped posture and decreased arm swing. No pill rolling tremor.  Toe and heel walk were deferred. Deep tendon reflexes: in the  upper and lower extremities are symmetric and intact.  Babinski response was deferred .      After spending a total time of 35  minutes face to face and additional time for physical and neurologic examination, review of laboratory studies,  personal review of imaging studies, reports and results of other testing and review of referral information / records as far as provided in visit, I have established the following assessments:  1)  MCI or early dementia, all very rapidly developing- 18 month of increasing frequency of transient  confusional  episodes. We are now discussion posterior cortical atrophy clinical signs, without PET scan to show this being present.  CSF testing  TAU- and ApoLipo protein- Still pending.  2) Central sleep apnea and PLMs in REM sleep are most common with  PD or neuro- degenerative disorders.  3) Slight Parkinsonism.- cog-wheeling, slowing, stooped posture, arm swing was seen but was not full.    My Plan is to proceed with:.  I would like to thank [name], MD and [place],  [place] [id number] for allowing me to meet with and to take care of this pleasant patient.   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  He has progressive memory loss, confusion and visio-spatial disorientation. It has crystalized to be most likely cortical posterior atrophy.   I plan to follow up either personally or through our NP within the next 2 months.  We need a MOCA with each visit.   CC: I will share my notes with PCP and made a RV in 4 month-    Electronically signed by: Larey Seat, MD 08/09/2019 2:20 PM  [facility] Board certified by The [employer] of Sleep Medicine and Diplomate of the [employer] of Sleep Medicine. Board certified In Neurology through the [place], Fellow of the [employer] of Neurology. Medical Director of [facility].

## 2019-08-10 ENCOUNTER — Ambulatory Visit: Payer: Medicare Other | Admitting: Physical Therapy

## 2019-08-10 ENCOUNTER — Encounter: Payer: Self-pay | Admitting: Physical Therapy

## 2019-08-10 ENCOUNTER — Other Ambulatory Visit: Payer: Self-pay

## 2019-08-10 ENCOUNTER — Ambulatory Visit: Payer: Medicare Other

## 2019-08-10 DIAGNOSIS — R2689 Other abnormalities of gait and mobility: Secondary | ICD-10-CM

## 2019-08-10 DIAGNOSIS — M6281 Muscle weakness (generalized): Secondary | ICD-10-CM

## 2019-08-10 DIAGNOSIS — R41841 Cognitive communication deficit: Secondary | ICD-10-CM

## 2019-08-10 NOTE — Therapy (Signed)
[facility] [address] [place], [place], [address] Phone: [phone]   Fax:  [phone]  Speech Language Pathology Treatment  Patient Details  Name: Spencer Fiorella, MD MRN: [id number] Date of Birth: 1940-12-09 Referring Provider (SLP): Alger Simons, MD   Encounter Date: 08/10/2019  End of Session - 08/10/19 1345    Visit Number  11    Number of Visits  17    Date for SLP Re-Evaluation  09/27/19    SLP Start Time  1152   6 mintues late   SLP Stop Time   6    SLP Time Calculation (min)  38 min    Activity Tolerance  Patient tolerated treatment well       Past Medical History:  Diagnosis Date  . Allergy   . Arthritis   . Cancer ([place])   . Cataract   . Chronic kidney disease    atrophic left kidney  . Coronary atherosclerosis    a. 2012 - incidentally noted on CT scan of chest;  b. 08/2011 Myoview: normal; c. 03/2015 CPX @ [facility]: normal w/ PAC's, PVC's, and a 3 beat run of NSVT in recovery.  Marland Kitchen Heart murmur    asymptomatic function murmur.  Marland Kitchen History of echocardiogram    a. 12/2004 Echo: Nl EF, trace TR/MR.  Marland Kitchen Hx of radiation therapy   . Hyperlipidemia   . Personal history of malignant neoplasm of prostate    a. 2008 s/p prostatecomtomy-->radiation-->currently on Lupron injections.  . Sinus bradycardia   . Syncope    a. 12/2016    Past Surgical History:  Procedure Laterality Date  . PROSTATECTOMY    . ROBOT ASSISTED LAPAROSCOPIC RADICAL PROSTATECTOMY    . SHOULDER SURGERY Right   . SKIN GRAFT Left   . TONSILLECTOMY      There were no vitals filed for this visit.  Subjective Assessment - 08/10/19 1155    Subjective  SLP and wife req'd to give pt cues for recall of MD appointment yesterday. SLP asked pt if he took notes from yesterday's MD appt for recall "He doesn't take notes - those visits go quickly."    Currently in Pain?  No/denies            ADULT SLP TREATMENT - 08/10/19 1216       General Information   Behavior/Cognition  Alert;Cooperative;Confused      Treatment Provided   Treatment provided  Cognitive-Linquistic      Cognitive-Linquistic Treatment   Treatment focused on  Cognition;Patient/family/caregiver education    Skilled Treatment  Pt req'd cues to recall he had homework, nor did he recall where he placed homework (wife cued pt). He might not have known to bring back homework due to SLP not explicitly telling him/wife to do so. Pt opened up his calendar where it was paper-clipped, and this was the current week. Pt told SLP which MD he saw yesterday with max-total cues from wife. SLP opened up session asking pt and wife what are some desired things for pt to be able to do - SLP opened up question to pt wife as well. Pt mentioned being able to recall people's names and some details about them in order to have small talk. SLP asked wife directly and pt wife offered "something for pt to do" as pt is used to being productive and now struggles to find things to do. SLP suggested constant therapy app and provided info about this to pt/wife. SLP suggested  to pt/wife for pt to look at pictures of people he will spend time with and 3-4 details aobut them. He should look at the person's picture, say their name and continue to say their name as he repeats the details about them, in order to give himself the best opportunity to recall names and details. Wife shared the transition has been challenging for her, and pt shared for him as well. SLP suggested neuropsych to assist dealing with feelings of change but pt wife stated they did not need neuropsych services. SLP told pt/wife that ST sessions are meant to be as practical as possible - that doing "busy work" is not always or not the best way to improve deficits.       Assessment / Recommendations / Plan   Plan  Continue with current plan of care      Progression Toward Goals   Progression toward goals  Progressing toward  goals       SLP Education - 08/10/19 1344    Education Details  reviewed procedural memory as best chance for pt to recall, constant therapy app, ST aims to be as practical as possible and not do "busy work"    [employer]) Educated  Patient;Spouse    Methods  Explanation;Demonstration    Comprehension  Verbalized understanding;Need further instruction       SLP Short Term Goals - 08/06/19 [place] #1   Title  pt will demonstrate intellectual awareness and tell SLP 3 non-physical deficits via multiple choice, direct questioning, or yes/no questions x3 sessions    Status  Partially Met      SLP SHORT TERM GOAL #2   Title  pt will demo emergent awareness (error awareness) in functional simple-mod complex tasks 90% of the time with modified indpendence (double checking for errors)    Status  Not Met      SLP SHORT TERM GOAL #3   Title  pt will complete cognitive linguistic testing in first 1-2 sessions    Status  Achieved       SLP Long Term Goals - 08/10/19 1347      SLP [place] #1   Title  pt will successfully use memory system/strategies to access appointment or social schedule, daily tasks/events, or planned tasks with occasional mod cues and extra time x3 sessions    Time  3    Period  Weeks    Status  On-going   "with occasional mod cues" added     SLP LONG TERM GOAL #2   Title  pt will demo emergent awareness when completing simple cognitive linguistic tasks x 5 sessions    Time  3    Period  Weeks    Status  On-going   "anticiapatory" to "emergent", "mod complex-complex" to "sipmle"     SLP LONG TERM GOAL #3   Title  pt will demo WNL alternating attention between two simple tasks x3 sessions    Time  4    Period  Weeks    Status  On-going   "mod complex" to "simple     SLP LONG TERM GOAL #4   Title  pt will use compensations for attention skills (reported by pt or wife, or during therapy) with cues to do so x 5 dates    Time  3     Period  Weeks    Status  On-going   added "with cues to do so"  SLP LONG TERM GOAL #5   Title  pt will use anomia compensations (if needed, after further informal or formal assessment) in mod comples conversation in 3 sessions    Time  3    Period  Weeks    Status  On-going       Plan - 08/10/19 1346    Clinical Impression Statement  Pt continues to present with significant cognitive communication deficits at least in the areas of memory and awareness, and higher level areas such as executive function and high level problem solving. See "skilled intervention" for more details. SLP considering focusing mainly on compensatory measures for memory, and believe pt will benefit from skilled ST targeting this,  for caregiver education.    Speech Therapy Frequency  2x / week    Duration  --   8 weeks or 17 visits   Treatment/Interventions  Functional tasks;SLP instruction and feedback;Compensatory strategies;Patient/family education;Internal/external aids;Cognitive reorganization;Cueing hierarchy;Language facilitation;Environmental controls    Potential to Achieve Goals  Good    Potential Considerations  Severity of impairments   ? diagnosis      Patient will benefit from skilled therapeutic intervention in order to improve the following deficits and impairments:   Cognitive communication deficit    Problem List Patient Active Problem List   Diagnosis Date Noted  . Neurodegenerative dementia, with behavioral disturbance ([place]) 07/02/2019  . Slow transit constipation   . Impulsive   . Sleep disturbance   . Prediabetes   . Bradycardia   . Labile blood pressure   . Seizures ([place])   . Hyponatremia 06/18/2019  . Agitation 06/18/2019  . Encephalopathy 06/18/2019  . 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 Sleep apnea, central 03/26/2019  . Transient  ischemic attack (TIA) 01/26/2019  . Transient diplopia 12/20/2018  . Dysesthesia affecting both sides of body 12/20/2018  . 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 Snoring 12/20/2018  . Nocturia more than twice per night 12/20/2018  . Insomnia disorder, with other sleep disorder, recurrent 12/20/2018  . Arthritis   . Cancer ([place])   . Elevated troponin   . Syncope 12/26/2016  . Family history of early CAD 12/26/2016  . Hyperlipidemia 08/15/2011  . PERSONAL HISTORY MALIGNANT NEOPLASM PROSTATE 07/11/2010    [facility] ,MS, CCC-SLP  08/10/2019, 1:53 PM  [facility] [address] [place], [place], [address] Phone: [phone]   Fax:  [phone]   Name: Jaxx Huish, MD MRN: [id number] Date of Birth: 06-24-1941

## 2019-08-10 NOTE — Therapy (Signed)
[facility] [address] [place], [place], [address] Phone: [phone]   Fax:  [phone]  Physical Therapy Treatment/10th Visit Progress Note   Patient Details  Name: Spencer Masch, MD MRN: [id number] Date of Birth: 07-06-1941 Referring Provider (PT): Meredith Staggers, MD  Progress Note Reporting Period 06/28/19 to 08/10/19   See note below for Objective Data and Assessment of Progress/Goals.     Encounter Date: 08/10/2019  PT End of Session - 08/10/19 1403    Visit Number  10    Number of Visits  13    Date for PT Re-Evaluation  09/05/19    Authorization Type  MCR/BCBS supplement, need progress note every 10th    PT Start Time  1240   pt late to PT after ST session   PT Stop Time  1317    PT Time Calculation (min)  37 min    Equipment Utilized During Treatment  Gait belt    Activity Tolerance  Patient tolerated treatment well    Behavior During Therapy  WFL for tasks assessed/performed       Past Medical History:  Diagnosis Date  . Allergy   . Arthritis   . Cancer ([place])   . Cataract   . Chronic kidney disease    atrophic left kidney  . Coronary atherosclerosis    a. 2012 - incidentally noted on CT scan of chest;  b. 08/2011 Myoview: normal; c. 03/2015 CPX @ [facility]: normal w/ PAC's, PVC's, and a 3 beat run of NSVT in recovery.  Marland Kitchen Heart murmur    asymptomatic function murmur.  Marland Kitchen History of echocardiogram    a. 12/2004 Echo: Nl EF, trace TR/MR.  Marland Kitchen Hx of radiation therapy   . Hyperlipidemia   . Personal history of malignant neoplasm of prostate    a. 2008 s/p prostatecomtomy-->radiation-->currently on Lupron injections.  . Sinus bradycardia   . Syncope    a. 12/2016    Past Surgical History:  Procedure Laterality Date  . PROSTATECTOMY    . ROBOT ASSISTED LAPAROSCOPIC RADICAL PROSTATECTOMY    . SHOULDER SURGERY Right   . SKIN GRAFT Left   . TONSILLECTOMY      There were no vitals filed for  this visit.  Subjective Assessment - 08/10/19 1242    Subjective  Occassionally notes that he has double vision. Remembering people is difficult. No falls.    Pertinent History  TIA/CVA, hyperlipidemia, prostate cancer 2008 with prostatectomy and radiation therapy, CAD with low-dose aspirin.    Limitations  Standing;Walking;House hold activities    How long can you walk comfortably?  20 min    Diagnostic tests  MRI/CT "A 4 mm subcortical infarct is present in the right left frontaloperculum"    Patient Stated Goals  improve balance, posture, strength, core    Currently in Pain?  No/denies                     [facility] Adult PT Treatment/Exercise - 08/10/19 1416      Ambulation/Gait   Ambulation/Gait  Yes    Ambulation/Gait Assistance  5: Supervision    Ambulation/Gait Assistance Details  initial demonstrative cueing for reciprocal arm swing    Ambulation Distance (Feet)  115 Feet    Assistive device  None    Gait Pattern  Step-through pattern;Decreased arm swing - left;Decreased arm swing - right    Ambulation Surface  Level;Indoor  Balance Exercises - 08/10/19 1417      Balance Exercises: Standing   Standing Eyes Closed  Wide (BOA);Narrow base of support (BOS);Foam/compliant surface;3 reps;20 secs   on thicker blue foam    Tandem Stance  Eyes closed;Foam/compliant surface;4 reps;15 secs;Other (comment)   in corner, modified tandem stance   Stepping Strategy  Posterior;Foam/compliant surface;Other reps (comment)   on thicker blue pad, 15 reps   Marching Limitations  on thicker blue foam: 15 reps B cues for slow controlled movement and posture    Other Standing Exercises  cues for larger step length B with stepping strategy          Access Code: [email or web address]  URL: [email or web address]  Date: 08/10/2019  Prepared by: Janann August   Exercises Walking March - 10 reps - 3 sets - 2x daily - 6x weekly Tandem Walking - 10 reps - 3 sets - 2x  daily - 6x weekly Walking with Head Rotation - 2 reps - 3 sets - 2x daily - 6x weekly Backwards Walking - 10 reps - 3 sets - 2x daily - 6x weekly Single Leg Stance - 3-5 reps - 1 sets - as long as you can hold - 2x daily - 6x weekly Supine Bridge - 10 reps - 1-2 sets - 5 hold - 2x daily - 6x weekly Bird Dog - 10 reps - 1 sets - 5 sec hold - 2x daily - 6x weekly Standing Row with Resistance - 10 reps - 2-3 sets - 2x daily - 6x weekly Shoulder extension with resistance - Neutral - 10 reps - 2-3 sets - 2x daily - 6x weekly Doorway Pec Stretch at 90 Degrees Abduction - 3 reps - 1 sets - 30 hold - 2x daily - 6x weekly    Reviewed exercises for home as pt and pt's wife reports that they have not been consistently doing these:   Romberg Stance Eyes Closed on Foam Pad - 4 sets - 30 hold - 1x daily - 7x weekly  Romberg Stance on Foam Pad with Head Rotation - 5 reps - 3 sets - 1x daily - 7x weekly -with feet slightly apart  Romberg Stance with Head Nods on Foam Pad - 5 reps - 3 sets - 1x daily - 7x weekly - with feet slightly apart    Provided pt and pt's wife with an exercise tracker sheet to check off when exercises have been performed (due to pt's cognitive impairments)    PT Education - 08/10/19 1402    Education Details  reviewed corner balance exercises for HEP, printed out an exercise tracker    Person(s) Educated  Patient;Spouse    Methods  Explanation;Handout    Comprehension  Verbalized understanding;Returned demonstration          PT Long Term Goals - 08/06/19 1344      PT LONG TERM GOAL #1   Title  Pt will be I and compliant with HEP. (Target for all goals 08/20/19)    Time  2    Period  Weeks    Status  On-going    Target Date  08/20/19      PT LONG TERM GOAL #2   Title  Pt will improve FGA to at least 30 to show improved dynamic balance and gait.    Baseline  28/30 on 07/22/19    Status  Revised      PT LONG TERM GOAL #3   Title  Patient will improve miniBEST score  to  a 28/28 to demo improved stepping strategy balance reaction.    Baseline  26/28 on 08/06/19    Status  New            Plan - 08/10/19 1422    Clinical Impression Statement  10th visit progress note: Goals checked at last session on 08/06/19 with pt achieving all LTGs and HEP is on-going. Upgraded and revised LTGs as needed. Pt scored a 28/30 on the FGA today with increased difficulty with eyes closed with gait, pt scored a 26/28 on the miniBEST today with increased difficulty with balance stepping strategies posterior and laterally. Pt continues to walk with a more forward flexed posture and decreased arm swing, needs frequent verbal and demonstrative cueing. Focus of today's session was balance with eyes closed on compliant surfaces as well as reviewing corner balance exercises on HEP. Pt is progressing well, will continue to benefit from skilled PT to progress towards LTGs.    Personal Factors and Comorbidities  Comorbidity 1;Comorbidity 2;Comorbidity 3+    Comorbidities  hyperlipidemia, prostate cancer 2008 with prostatectomy and radiation therapy, CAD with low-dose aspirin.    Examination-Activity Limitations  Locomotion Level;Carry;Squat;Stairs;Lift;Stand    Examination-Participation Restrictions  Meal Prep;Cleaning;Community Activity;Driving;Laundry;Yard Work;Shop    Stability/Clinical Decision Making  Evolving/Moderate complexity    Rehab Potential  Good    PT Frequency  2x / week    PT Duration  6 weeks    PT Treatment/Interventions  ADLs/Self Care Home Management;Aquatic Therapy;Gait training;Stair training;Functional mobility training;Therapeutic activities;Therapeutic exercise;Balance training;Neuromuscular re-education;Manual techniques    PT Next Visit Plan  continue arm swing gait training/upright posture. review march walking with arm swings. gait with eyes closed, posterior and lateral stepping strategies. eyes closed balance on thicker foam, upgrade HEP as needed.    PT Home  Exercise Plan  Access Code: [email or web address], added march walking with arm swings    Consulted and Agree with Plan of Care  Patient;Family member/caregiver    Family Member Consulted  wife       Patient will benefit from skilled therapeutic intervention in order to improve the following deficits and impairments:  Abnormal gait, Decreased activity tolerance, Decreased balance, Cardiopulmonary status limiting activity, Decreased endurance, Decreased range of motion, Decreased strength, Difficulty walking, Postural dysfunction  Visit Diagnosis: Muscle weakness (generalized)  Other abnormalities of gait and mobility     Problem List Patient Active Problem List   Diagnosis Date Noted  . Neurodegenerative dementia, with behavioral disturbance ([place]) 07/02/2019  . Slow transit constipation   . Impulsive   . Sleep disturbance   . Prediabetes   . Bradycardia   . Labile blood pressure   . Seizures ([place])   . Hyponatremia 06/18/2019  . Agitation 06/18/2019  . Encephalopathy 06/18/2019  . 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 Sleep apnea, central 03/26/2019  . Transient ischemic attack (TIA) 01/26/2019  . Transient diplopia 12/20/2018  . Dysesthesia affecting both sides of body 12/20/2018  . 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 Snoring 12/20/2018  . Nocturia more than twice per night 12/20/2018  . Insomnia disorder, with other sleep disorder, recurrent 12/20/2018  . Arthritis   . Cancer ([place])   . Elevated troponin   . Syncope 12/26/2016  . Family history of early CAD 12/26/2016  . Hyperlipidemia 08/15/2011  . PERSONAL HISTORY MALIGNANT NEOPLASM PROSTATE 07/11/2010    Sham Alviar N  Rodney Booze, PT, DPT  08/10/2019, 2:36 PM  [facility] [address] [place],  [place], [address] Phone: [phone]   Fax:  [phone]  Name: Spencer Hougen, MD MRN: [id number] Date of Birth: 04/13/1941

## 2019-08-10 NOTE — Patient Instructions (Signed)
   Please come to next session with some more things that you desire and we will see if I have some expertise to offer.  Look at the Constant Therapy app at the [employer].

## 2019-08-13 ENCOUNTER — Ambulatory Visit: Payer: Medicare Other | Admitting: Physical Therapy

## 2019-08-13 ENCOUNTER — Ambulatory Visit: Payer: Medicare Other

## 2019-08-13 ENCOUNTER — Encounter: Payer: Self-pay | Admitting: Physical Therapy

## 2019-08-13 ENCOUNTER — Other Ambulatory Visit: Payer: Self-pay

## 2019-08-13 DIAGNOSIS — R4701 Aphasia: Secondary | ICD-10-CM

## 2019-08-13 DIAGNOSIS — M6281 Muscle weakness (generalized): Secondary | ICD-10-CM | POA: Diagnosis not present

## 2019-08-13 DIAGNOSIS — R41841 Cognitive communication deficit: Secondary | ICD-10-CM

## 2019-08-13 DIAGNOSIS — R2689 Other abnormalities of gait and mobility: Secondary | ICD-10-CM

## 2019-08-13 NOTE — Patient Instructions (Signed)
  Please complete the assigned speech therapy homework prior to your next session and return it to the speech therapist at your next visit.  

## 2019-08-13 NOTE — Therapy (Addendum)
[facility] [address] [place], [place], [address] Phone: [phone]   Fax:  [phone]  Speech Language Pathology Treatment  Patient Details  Name: Spencer Galant, MD MRN: [id number] Date of Birth: 10-Jul-1941 Referring Provider (SLP): Alger Simons, MD   Encounter Date: 08/13/2019  End of Session - 08/13/19 1236    Visit Number  12    Number of Visits  17    Date for SLP Re-Evaluation  09/27/19    SLP Start Time  1148    SLP Stop Time   1230    SLP Time Calculation (min)  42 min    Activity Tolerance  Patient tolerated treatment well       Past Medical History:  Diagnosis Date  . Allergy   . Arthritis   . Cancer ([place])   . Cataract   . Chronic kidney disease    atrophic left kidney  . Coronary atherosclerosis    a. 2012 - incidentally noted on CT scan of chest;  b. 08/2011 Myoview: normal; c. 03/2015 CPX @ [facility]: normal w/ PAC's, PVC's, and a 3 beat run of NSVT in recovery.  Marland Kitchen Heart murmur    asymptomatic function murmur.  Marland Kitchen History of echocardiogram    a. 12/2004 Echo: Nl EF, trace TR/MR.  Marland Kitchen Hx of radiation therapy   . Hyperlipidemia   . Personal history of malignant neoplasm of prostate    a. 2008 s/p prostatecomtomy-->radiation-->currently on Lupron injections.  . Sinus bradycardia   . Syncope    a. 12/2016    Past Surgical History:  Procedure Laterality Date  . PROSTATECTOMY    . ROBOT ASSISTED LAPAROSCOPIC RADICAL PROSTATECTOMY    . SHOULDER SURGERY Right   . SKIN GRAFT Left   . TONSILLECTOMY      There were no vitals filed for this visit.  Subjective Assessment - 08/13/19 1153    Subjective  Pt began walking down the wrong hall to [place].    Patient is accompained by:  Family member   wife - Opal Sidles   Currently in Pain?  No/denies            ADULT SLP TREATMENT - 08/13/19 1154      General Information   Behavior/Cognition  Alert;Cooperative;Confused;Pleasant mood       Treatment Provided   Treatment provided  Cognitive-Linquistic      Cognitive-Linquistic Treatment   Treatment focused on  Cognition;Patient/family/caregiver education    Skilled Treatment  Pt expresses frustration with decr'd ability for word generation/verbal expression. Wife tells SLP about some attention deficits at home. Long discussion today about the cognitive and linguistic goals to target in sessions. With pt/wife agreeing, SLP will concentrate on these goals of improving attention, and lessening impact of pt's aphasia. At times during the conversation, pt noted to have "blank stare" when wife or SLP talking. Pt usually responded within 2-3 seconds when asked a direct question during the discussion.       Assessment / Recommendations / Plan   Plan  Continue with current plan of care      Progression Toward Goals   Progression toward goals  Progressing toward goals       SLP Education - 08/13/19 1225    Education Details  Aphasia, sustained/selective attention, compensations for cognition are areas SLP can assist    Person(s) Educated  Patient;Spouse    Methods  Explanation    Comprehension  Verbalized understanding       SLP  Short Term Goals - 08/06/19 1203      SLP SHORT TERM GOAL #1   Title  pt will demonstrate intellectual awareness and tell SLP 3 non-physical deficits via multiple choice, direct questioning, or yes/no questions x3 sessions    Status  Partially Met      SLP SHORT TERM GOAL #2   Title  pt will demo emergent awareness (error awareness) in functional simple-mod complex tasks 90% of the time with modified indpendence (double checking for errors)    Status  Not Met      SLP SHORT TERM GOAL #3   Title  pt will complete cognitive linguistic testing in first 1-2 sessions    Status  Achieved       SLP Long Term Goals - 08/13/19 [place] #1   Title  pt will successfully use memory system/strategies to access appointment or social  schedule, daily tasks/events, or planned tasks with occasional mod cues and extra time x3 sessions    Time  3    Period  Weeks    Status  On-going   "with occasional mod cues" added     SLP LONG TERM GOAL #2   Title  pt will demo emergent awareness when completing simple cognitive linguistic tasks x 5 sessions    Time  3    Period  Weeks    Status  On-going   "anticiapatory" to "emergent", "mod complex-complex" to "sipmle"     SLP LONG TERM GOAL #3   Title  pt will demo WNL alternating attention between two simple tasks x3 sessions    Time  4    Period  Weeks    Status  On-going   "mod complex" to "simple     SLP LONG TERM GOAL #4   Title  pt will use compensations for attention skills (reported by pt or wife, or during therapy) with cues to do so x 5 dates    Time  3    Period  Weeks    Status  On-going   added "with cues to do so"     SLP LONG TERM GOAL #5   Title  pt will use anomia compensations (if needed, after further informal or formal assessment) in mod comples conversation in 3 sessions    Time  3    Period  Weeks    Status  On-going       Plan - 08/13/19 1236    Clinical Impression Statement  Pt continues to present with significant cognitive communication deficits at least in the areas of memory and awareness, and higher level areas such as executive function and high level problem solving. Pt demonstrates verbal expression deficits during session today, and wife reports intermittent aphsia at home (yesterday she indicates she had a normal conversation with pt about his condition). See "skilled intervention" for more details. SLP considering focusing mainly on compensatory measures for memory, and believe pt will benefit from skilled ST targeting this,  for caregiver education.    Speech Therapy Frequency  2x / week    Duration  --   8 weeks or 17 visits   Treatment/Interventions  Functional tasks;SLP instruction and feedback;Compensatory strategies;Patient/family  education;Internal/external aids;Cognitive reorganization;Cueing hierarchy;Language facilitation;Environmental controls    Potential to Achieve Goals  Good    Potential Considerations  Severity of impairments   ? diagnosis      Patient will benefit from skilled therapeutic intervention in order to improve  the following deficits and impairments:   Cognitive communication deficit  Aphasia    Problem List Patient Active Problem List   Diagnosis Date Noted  . Neurodegenerative dementia, with behavioral disturbance ([place]) 07/02/2019  . Slow transit constipation   . Impulsive   . Sleep disturbance   . Prediabetes   . Bradycardia   . Labile blood pressure   . Seizures ([place])   . Hyponatremia 06/18/2019  . Agitation 06/18/2019  . Encephalopathy 06/18/2019  . 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 Sleep apnea, central 03/26/2019  . Transient ischemic attack (TIA) 01/26/2019  . Transient diplopia 12/20/2018  . Dysesthesia affecting both sides of body 12/20/2018  . 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 Snoring 12/20/2018  . Nocturia more than twice per night 12/20/2018  . Insomnia disorder, with other sleep disorder, recurrent 12/20/2018  . Arthritis   . Cancer ([place])   . Elevated troponin   . Syncope 12/26/2016  . Family history of early CAD 12/26/2016  . Hyperlipidemia 08/15/2011  . PERSONAL HISTORY MALIGNANT NEOPLASM PROSTATE 07/11/2010    Dr [facility] ,[place], CCC-SLP  08/13/2019, 12:54 PM  [facility] [address] [place], [place], [address] Phone: [phone]   Fax:  [phone]   Name: Spencer Gibbons, MD MRN: [id number] Date of Birth: [birth year]

## 2019-08-13 NOTE — Therapy (Signed)
[facility] [address] [place], [place], [address] Phone: [phone]   Fax:  [phone]  Physical Therapy Treatment  Patient Details  Name: Spencer Cesar, MD MRN: [id number] Date of Birth: [birth year] Referring Provider (PT): Meredith Staggers, MD   Encounter Date: 08/13/2019  PT End of Session - 08/13/19 1324    Visit Number  11    Number of Visits  13    Date for PT Re-Evaluation  09/05/19    Authorization Type  MCR/BCBS supplement, need progress note every 10th    PT Start Time  1235    PT Stop Time  1318    PT Time Calculation (min)  43 min    Equipment Utilized During Treatment  Gait belt    Activity Tolerance  Patient tolerated treatment well    Behavior During Therapy  [facility] for tasks assessed/performed       Past Medical History:  Diagnosis Date  . Allergy   . Arthritis   . Cancer ([place])   . Cataract   . Chronic kidney disease    atrophic left kidney  . Coronary atherosclerosis    a. 2012 - incidentally noted on CT scan of chest;  b. 08/2011 Myoview: normal; c. 03/2015 CPX @ [facility]: normal w/ PAC's, PVC's, and a 3 beat run of NSVT in recovery.  Marland Kitchen Heart murmur    asymptomatic function murmur.  Marland Kitchen History of echocardiogram    a. 12/2004 Echo: Nl EF, trace TR/MR.  Marland Kitchen Hx of radiation therapy   . Hyperlipidemia   . Personal history of malignant neoplasm of prostate    a. 2008 s/p prostatecomtomy-->radiation-->currently on Lupron injections.  . Sinus bradycardia   . Syncope    a. 12/2016    Past Surgical History:  Procedure Laterality Date  . PROSTATECTOMY    . ROBOT ASSISTED LAPAROSCOPIC RADICAL PROSTATECTOMY    . SHOULDER SURGERY Right   . SKIN GRAFT Left   . TONSILLECTOMY      There were no vitals filed for this visit.  Subjective Assessment - 08/13/19 1239    Subjective  No falls. No new complaints.    Pertinent History  TIA/CVA, hyperlipidemia, prostate cancer 2008 with prostatectomy  and radiation therapy, CAD with low-dose aspirin.    Limitations  Standing;Walking;House hold activities    How long can you walk comfortably?  20 min    Diagnostic tests  MRI/CT "A 4 mm subcortical infarct is present in the right left frontaloperculum"    Patient Stated Goals  improve balance, posture, strength, core    Currently in Pain?  No/denies                        [facility] Adult PT Treatment/Exercise - 08/13/19 0001      Ambulation/Gait   Ambulation/Gait  Yes    Ambulation/Gait Assistance  5: Supervision    Ambulation/Gait Assistance Details  initial verbal and demonstrative cueing for reciprocal arm swing, pt able to maintain throughout lap in gym at end of session     Ambulation Distance (Feet)  115 Feet    Assistive device  None    Gait Pattern  Step-through pattern;Decreased arm swing - left;Decreased arm swing - right;Narrow base of support    Ambulation Surface  Level;Indoor      Neuro Re-ed    Neuro Re-ed Details   On red mat: with multi-directional stepping sheet on counter - focus on multi-directional stepping strategies  with therapist calling out instructions, pt initially takes increased time to perform, cues for larger steps. In corner: on rocker board - balance with eyes closed trying to hold steady, multiple reps, last 2 reps able to perform for 30 seconds. On double blue folded over foam with wide BOS and eyes closed, multiple reps, last 2 reps able to perform for 30 seconds.            Pt performs PWR! Moves in standing position:    PWR! Up for improved posture 1 x 10 reps on floor surface, 1 x 10 reps on blue foam - needs demonstrative cues, 3-5 second holds for posture   PWR! Step for improved step initiation 1 x 10 reps on floor surface (cues to look at UE), 1 x 10 reps on blue foam (just focus on stepping strategy)          PT Education - 08/13/19 1323    Education Details  purchasing a balance pad for home for corner balance  exercises - sent amazon link to pt's wife    Person(s) Educated  Patient;Spouse    Methods  Explanation          PT Long Term Goals - 08/06/19 1344      PT LONG TERM GOAL #1   Title  Pt will be I and compliant with HEP. (Target for all goals 08/20/19)    Time  2    Period  Weeks    Status  On-going    Target Date  08/20/19      PT LONG TERM GOAL #2   Title  Pt will improve FGA to at least 30 to show improved dynamic balance and gait.    Baseline  28/30 on 07/22/19    Status  Revised      PT LONG TERM GOAL #3   Title  Patient will improve miniBEST score to a 28/28 to demo improved stepping strategy balance reaction.    Baseline  26/28 on 08/06/19    Status  New            Plan - 08/13/19 1325    Clinical Impression Statement  Focus of today's skilled session was stepping balance strategies, eyes closed balance on compliant surfaces, and postural activities. Pt needed frequent demonstrative cues for standing PWR! moves for proper technique and cues. Pt improved with eyes closed balance on compliant surfaces with eyes closed and able to hold for 30 seconds with increased reps. Pt able to demonstrate improved reciprocal arm swing at end of session today with only initial verbal/demonstrative cueing. Will continue to progress towards LTGs.    Personal Factors and Comorbidities  Comorbidity 1;Comorbidity 2;Comorbidity 3+    Comorbidities  hyperlipidemia, prostate cancer 2008 with prostatectomy and radiation therapy, CAD with low-dose aspirin.    Examination-Activity Limitations  Locomotion Level;Carry;Squat;Stairs;Lift;Stand    Examination-Participation Restrictions  Meal Prep;Cleaning;Community Activity;Driving;Laundry;Yard Work;Shop    Stability/Clinical Decision Making  Evolving/Moderate complexity    Rehab Potential  Good    PT Frequency  2x / week    PT Duration  6 weeks    PT Treatment/Interventions  ADLs/Self Care Home Management;Aquatic Therapy;Gait training;Stair  training;Functional mobility training;Therapeutic activities;Therapeutic exercise;Balance training;Neuromuscular re-education;Manual techniques    PT Next Visit Plan  finalizing HEP, check remaining LTGs. eyes closed balance and stepping strategies.    PT Home Exercise Plan  Access Code: [email or web address], added march walking with arm swings    Consulted and Agree with Plan of Care  Patient;Family  member/caregiver    Family Member Consulted  wife       Patient will benefit from skilled therapeutic intervention in order to improve the following deficits and impairments:  Abnormal gait, Decreased activity tolerance, Decreased balance, Cardiopulmonary status limiting activity, Decreased endurance, Decreased range of motion, Decreased strength, Difficulty walking, Postural dysfunction  Visit Diagnosis: Muscle weakness (generalized)  Other abnormalities of gait and mobility     Problem List Patient Active Problem List   Diagnosis Date Noted  . Neurodegenerative dementia, with behavioral disturbance ([place]) 07/02/2019  . Slow transit constipation   . Impulsive   . Sleep disturbance   . Prediabetes   . Bradycardia   . Labile blood pressure   . Seizures ([place])   . Hyponatremia 06/18/2019  . Agitation 06/18/2019  . Encephalopathy 06/18/2019  . 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 Sleep apnea, central 03/26/2019  . Transient ischemic attack (TIA) 01/26/2019  . Transient diplopia 12/20/2018  . Dysesthesia affecting both sides of body 12/20/2018  . 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 Snoring 12/20/2018  . Nocturia more than twice per night 12/20/2018  . Insomnia disorder, with other sleep disorder, recurrent 12/20/2018  . Arthritis   . Cancer ([place])   . Elevated troponin   . Syncope 12/26/2016  . Family history of early CAD  12/26/2016  . Hyperlipidemia 08/15/2011  . PERSONAL HISTORY MALIGNANT NEOPLASM PROSTATE 07/11/2010    Arliss Journey, PT, DPT  08/13/2019, 1:35 PM  [facility] [address] [place], [place], [address] Phone: [phone]   Fax:  [phone]  Name: Spencer Domenick, MD MRN: [id number] Date of Birth: 1940-09-30

## 2019-08-16 ENCOUNTER — Ambulatory Visit: Payer: Medicare Other

## 2019-08-16 ENCOUNTER — Ambulatory Visit: Payer: Medicare Other | Admitting: Physical Therapy

## 2019-08-16 DIAGNOSIS — M79645 Pain in left finger(s): Secondary | ICD-10-CM | POA: Diagnosis not present

## 2019-08-16 DIAGNOSIS — S63287A Dislocation of proximal interphalangeal joint of left little finger, initial encounter: Secondary | ICD-10-CM | POA: Diagnosis not present

## 2019-08-17 ENCOUNTER — Ambulatory Visit: Payer: Medicare Other | Admitting: Physical Therapy

## 2019-08-17 ENCOUNTER — Other Ambulatory Visit: Payer: Self-pay

## 2019-08-17 ENCOUNTER — Ambulatory Visit: Payer: Medicare Other | Admitting: Speech Pathology

## 2019-08-17 DIAGNOSIS — L84 Corns and callosities: Secondary | ICD-10-CM | POA: Diagnosis not present

## 2019-08-17 DIAGNOSIS — M6281 Muscle weakness (generalized): Secondary | ICD-10-CM | POA: Diagnosis not present

## 2019-08-17 DIAGNOSIS — R4701 Aphasia: Secondary | ICD-10-CM

## 2019-08-17 DIAGNOSIS — L821 Other seborrheic keratosis: Secondary | ICD-10-CM | POA: Diagnosis not present

## 2019-08-17 DIAGNOSIS — L812 Freckles: Secondary | ICD-10-CM | POA: Diagnosis not present

## 2019-08-17 DIAGNOSIS — D1801 Hemangioma of skin and subcutaneous tissue: Secondary | ICD-10-CM | POA: Diagnosis not present

## 2019-08-17 DIAGNOSIS — R2689 Other abnormalities of gait and mobility: Secondary | ICD-10-CM

## 2019-08-17 DIAGNOSIS — D225 Melanocytic nevi of trunk: Secondary | ICD-10-CM | POA: Diagnosis not present

## 2019-08-17 DIAGNOSIS — R41841 Cognitive communication deficit: Secondary | ICD-10-CM

## 2019-08-17 NOTE — Therapy (Signed)
[facility] [address] [place], [place], [address] Phone: [phone]   Fax:  [phone]  Physical Therapy Treatment  Patient Details  Name: Spencer Cort, MD MRN: [id number] Date of Birth: [birth year] Referring Provider (PT): Meredith Staggers, MD   Encounter Date: 08/17/2019  PT End of Session - 08/17/19 1623    Visit Number  12    Number of Visits  13    Date for PT Re-Evaluation  09/05/19    Authorization Type  MCR/BCBS supplement, need progress note every 10th    PT Start Time  1533    PT Stop Time  1615    PT Time Calculation (min)  42 min    Equipment Utilized During Treatment  Gait belt    Activity Tolerance  Patient tolerated treatment well    Behavior During Therapy  [facility] for tasks assessed/performed       Past Medical History:  Diagnosis Date  . Allergy   . Arthritis   . Cancer ([place])   . Cataract   . Chronic kidney disease    atrophic left kidney  . Coronary atherosclerosis    a. 2012 - incidentally noted on CT scan of chest;  b. 08/2011 Myoview: normal; c. 03/2015 CPX @ [facility]: normal w/ PAC's, PVC's, and a 3 beat run of NSVT in recovery.  Marland Kitchen Heart murmur    asymptomatic function murmur.  Marland Kitchen History of echocardiogram    a. 12/2004 Echo: Nl EF, trace TR/MR.  Marland Kitchen Hx of radiation therapy   . Hyperlipidemia   . Personal history of malignant neoplasm of prostate    a. 2008 s/p prostatecomtomy-->radiation-->currently on Lupron injections.  . Sinus bradycardia   . Syncope    a. 12/2016    Past Surgical History:  Procedure Laterality Date  . PROSTATECTOMY    . ROBOT ASSISTED LAPAROSCOPIC RADICAL PROSTATECTOMY    . SHOULDER SURGERY Right   . SKIN GRAFT Left   . TONSILLECTOMY      There were no vitals filed for this visit.  Subjective Assessment - 08/17/19 1544    Subjective  Slipped on the stair yesterday, and dislocated L pinky. No other injuries.    Pertinent History  TIA/CVA,  hyperlipidemia, prostate cancer 2008 with prostatectomy and radiation therapy, CAD with low-dose aspirin.    Limitations  Standing;Walking;House hold activities    How long can you walk comfortably?  20 min    Diagnostic tests  MRI/CT "A 4 mm subcortical infarct is present in the right left frontaloperculum"    Patient Stated Goals  improve balance, posture, strength, core    Currently in Pain?  No/denies                       [facility] Adult PT Treatment/Exercise - 08/17/19 0001      Ambulation/Gait   Ambulation/Gait  Yes    Ambulation/Gait Assistance  5: Supervision    Ambulation/Gait Assistance Details  With boomwhackers in both hands, clapping them forward anteriorly and then posteriorly for improved arm swing and posture, pt with most difficulty with touching them together backwards.     Ambulation Distance (Feet)  230 Feet    Assistive device  None    Gait Pattern  Step-through pattern;Decreased arm swing - left;Decreased arm swing - right;Narrow base of support    Ambulation Surface  Level;Indoor      Neuro Re-ed    Neuro Re-ed Details   With patient continuing  to hold boom whackers and perform arm swing anteriorly/posteriorly by touching boom whackers together (auditory cueing for success) - obstacle course weaving figure 8s around cones progressing to adding in hurdles of different heights and weaving in and out of cones, progressed to adding cognitive challenge with pt subtracting and doing math, pt with difficulty clearing hurdles with proper technique when performing and with tendency to lose arm swing with boomwhackers. With theraband on floor creating a cross/4 boxes had pt practice stepping strategy into numbered 1-4 boxes with therapist calling out a number, progressing to increasing speed. Progressed to having pt stand on red floor mat with bean bags underneath for compliant surface, added cognitive challenge by giving pt a sequence to step to. Min guard for balance.  On red mat: Eyes closed standing marching with focus on slow SLS 2 x 10 reps. Sit to stands at edge of mat eyes closed on level surface 1 x 10 reps, eyes closed standing on foam - 2 x5 reps with need for UE support to stand and sit.                   PT Long Term Goals - 08/06/19 1344      PT LONG TERM GOAL #1   Title  Pt will be I and compliant with HEP. (Target for all goals 08/20/19)    Time  2    Period  Weeks    Status  On-going    Target Date  08/20/19      PT LONG TERM GOAL #2   Title  Pt will improve FGA to at least 30 to show improved dynamic balance and gait.    Baseline  28/30 on 07/22/19    Status  Revised      PT LONG TERM GOAL #3   Title  Patient will improve miniBEST score to a 28/28 to demo improved stepping strategy balance reaction.    Baseline  26/28 on 08/06/19    Status  New            Plan - 08/17/19 1624    Clinical Impression Statement  Focus of today's skilled session was gait training with arm swing, stepping strategy, balance on compliant surfaces, and obstacles with focus on SLS. Pt with continued difficulty of performing a cognitive task while dual tasking and performing obstacle hurdles - needed cues for increased hip/knee flexion for stepping over with RLE. Pt with increased difficulty with sit <> stands with eyes closed on solid surface and foam - able to improve with improved reps. Will assess LTGs at next session and anticipate D/C.    Personal Factors and Comorbidities  Comorbidity 1;Comorbidity 2;Comorbidity 3+    Comorbidities  hyperlipidemia, prostate cancer 2008 with prostatectomy and radiation therapy, CAD with low-dose aspirin.    Examination-Activity Limitations  Locomotion Level;Carry;Squat;Stairs;Lift;Stand    Examination-Participation Restrictions  Meal Prep;Cleaning;Community Activity;Driving;Laundry;Yard Work;Shop    Stability/Clinical Decision Making  Evolving/Moderate complexity    Rehab Potential  Good    PT Frequency   2x / week    PT Duration  6 weeks    PT Treatment/Interventions  ADLs/Self Care Home Management;Aquatic Therapy;Gait training;Stair training;Functional mobility training;Therapeutic activities;Therapeutic exercise;Balance training;Neuromuscular re-education;Manual techniques    PT Next Visit Plan  finalizing HEP, check remaining LTGs.    PT Home Exercise Plan  Access Code: [email or web address], added march walking with arm swings    Consulted and Agree with Plan of Care  Patient;Family member/caregiver    Family Member Consulted  wife       Patient will benefit from skilled therapeutic intervention in order to improve the following deficits and impairments:  Abnormal gait, Decreased activity tolerance, Decreased balance, Cardiopulmonary status limiting activity, Decreased endurance, Decreased range of motion, Decreased strength, Difficulty walking, Postural dysfunction  Visit Diagnosis: Other abnormalities of gait and mobility  Muscle weakness (generalized)     Problem List Patient Active Problem List   Diagnosis Date Noted  . Neurodegenerative dementia, with behavioral disturbance ([place]) 07/02/2019  . Slow transit constipation   . Impulsive   . Sleep disturbance   . Prediabetes   . Bradycardia   . Labile blood pressure   . Seizures ([place])   . Hyponatremia 06/18/2019  . Agitation 06/18/2019  . Encephalopathy 06/18/2019  . 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 Sleep apnea, central 03/26/2019  . Transient ischemic attack (TIA) 01/26/2019  . Transient diplopia 12/20/2018  . Dysesthesia affecting both sides of body 12/20/2018  . 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 Snoring 12/20/2018  . Nocturia more than twice per night 12/20/2018  . Insomnia disorder, with other sleep disorder, recurrent 12/20/2018  . Arthritis   .  Cancer ([place])   . Elevated troponin   . Syncope 12/26/2016  . Family history of early CAD 12/26/2016  . Hyperlipidemia 08/15/2011  . PERSONAL HISTORY MALIGNANT NEOPLASM PROSTATE 07/11/2010    Arliss Journey, PT, DPT  08/17/2019, 4:30 PM  [facility] [address] [place], [place], [address] Phone: [phone]   Fax:  [phone]  Name: Spencer Deni, MD MRN: [id number] Date of Birth: April 23, 1941

## 2019-08-19 ENCOUNTER — Ambulatory Visit: Payer: Medicare Other | Admitting: Physical Therapy

## 2019-08-19 ENCOUNTER — Other Ambulatory Visit: Payer: Self-pay

## 2019-08-19 ENCOUNTER — Encounter: Payer: Self-pay | Admitting: Physical Therapy

## 2019-08-19 ENCOUNTER — Encounter: Payer: Medicare Other | Admitting: Speech Pathology

## 2019-08-19 DIAGNOSIS — M6281 Muscle weakness (generalized): Secondary | ICD-10-CM | POA: Diagnosis not present

## 2019-08-19 DIAGNOSIS — R2689 Other abnormalities of gait and mobility: Secondary | ICD-10-CM

## 2019-08-19 NOTE — Therapy (Signed)
[facility] [address] [place], [place], [address] Phone: [phone]   Fax:  [phone]  Physical Therapy Treatment/ Discharge Summary   Patient Details  Name: Spencer Bissonnette, MD MRN: [id number] Date of Birth: [birth year] Referring Provider (PT): Meredith Staggers, MD   Encounter Date: 08/19/2019  PT End of Session - 08/19/19 1156    Visit Number  13    Number of Visits  13    Date for PT Re-Evaluation  09/05/19    Authorization Type  MCR/BCBS supplement, need progress note every 10th    PT Start Time  0931    PT Stop Time  1016    PT Time Calculation (min)  45 min    Equipment Utilized During Treatment  Gait belt    Activity Tolerance  Patient tolerated treatment well    Behavior During Therapy  [facility] for tasks assessed/performed       Past Medical History:  Diagnosis Date  . Allergy   . Arthritis   . Cancer ([place])   . Cataract   . Chronic kidney disease    atrophic left kidney  . Coronary atherosclerosis    a. 2012 - incidentally noted on CT scan of chest;  b. 08/2011 Myoview: normal; c. 03/2015 CPX @ [facility]: normal w/ PAC's, PVC's, and a 3 beat run of NSVT in recovery.  Marland Kitchen Heart murmur    asymptomatic function murmur.  Marland Kitchen History of echocardiogram    a. 12/2004 Echo: Nl EF, trace TR/MR.  Marland Kitchen Hx of radiation therapy   . Hyperlipidemia   . Personal history of malignant neoplasm of prostate    a. 2008 s/p prostatecomtomy-->radiation-->currently on Lupron injections.  . Sinus bradycardia   . Syncope    a. 12/2016    Past Surgical History:  Procedure Laterality Date  . PROSTATECTOMY    . ROBOT ASSISTED LAPAROSCOPIC RADICAL PROSTATECTOMY    . SHOULDER SURGERY Right   . SKIN GRAFT Left   . TONSILLECTOMY      There were no vitals filed for this visit.  Subjective Assessment - 08/19/19 0934    Subjective  No changes. no new complaints.    Pertinent History  TIA/CVA, hyperlipidemia, prostate cancer 2008  with prostatectomy and radiation therapy, CAD with low-dose aspirin.    Limitations  Standing;Walking;House hold activities    How long can you walk comfortably?  20 min    Diagnostic tests  MRI/CT "A 4 mm subcortical infarct is present in the right left frontaloperculum"    Patient Stated Goals  improve balance, posture, strength, core    Currently in Pain?  No/denies         [facility] PT Assessment - 08/19/19 0938      Mini-BESTest   Sit To Stand  Normal: Comes to stand without use of hands and stabilizes independently.    Rise to Toes  Normal: Stable for 3 s with maximum height.    Stand on one leg (left)  Normal: 20 s.    Stand on one leg (right)  Normal: 20 s.    Stand on one leg - lowest score  2    Compensatory Stepping Correction - Forward  Normal: Recovers independently with a single, large step (second realignement is allowed).    Compensatory Stepping Correction - Backward  Normal: Recovers independently with a single, large step    Compensatory Stepping Correction - Left Lateral  Normal: Recovers independently with 1 step (crossover or lateral OK)  Compensatory Stepping Correction - Right Lateral  Normal: Recovers independently with 1 step (crossover or lateral OK)    Stepping Corredtion Lateral - lowest score  2    Stance - Feet together, eyes open, firm surface   Normal: 30s    Stance - Feet together, eyes closed, foam surface   Normal: 30s    Incline - Eyes Closed  Normal: Stands independently 30s and aligns with gravity    Change in Gait Speed  Normal: Significantly changes walkling speed without imbalance    Walk with head turns - Horizontal  Normal: performs head turns with no change in gait speed and good balance    Walk with pivot turns  Normal: Turns with feet close FAST (< 3 steps) with good balance.    Step over obstacles  Normal: Able to step over box with minimal change of gait speed and with good balance.    Timed UP & GO with Dual Task  Normal: No noticeable  change in sitting, standing or walking while backward counting when compared to TUG without    Mini-BEST total score  28      Functional Gait  Assessment   Gait assessed   Yes    Gait Level Surface  Walks 20 ft in less than 5.5 sec, no assistive devices, good speed, no evidence for imbalance, normal gait pattern, deviates no more than 6 in outside of the 12 in walkway width.    Change in Gait Speed  Able to smoothly change walking speed without loss of balance or gait deviation. Deviate no more than 6 in outside of the 12 in walkway width.    Gait with Horizontal Head Turns  Performs head turns smoothly with no change in gait. Deviates no more than 6 in outside 12 in walkway width    Gait with Vertical Head Turns  Performs head turns with no change in gait. Deviates no more than 6 in outside 12 in walkway width.    Gait and Pivot Turn  Pivot turns safely within 3 sec and stops quickly with no loss of balance.    Step Over Obstacle  Is able to step over 2 stacked shoe boxes taped together (9 in total height) without changing gait speed. No evidence of imbalance.    Gait with Narrow Base of Support  Is able to ambulate for 10 steps heel to toe with no staggering.    Gait with Eyes Closed  Walks 20 ft, uses assistive device, slower speed, mild gait deviations, deviates 6-10 in outside 12 in walkway width. Ambulates 20 ft in less than 9 sec but greater than 7 sec.    Ambulating Backwards  Walks 20 ft, no assistive devices, good speed, no evidence for imbalance, normal gait    Steps  Alternating feet, no rail.    Total Score  29    FGA comment:  29/30            Access Code: [email or web address]  URL: [email or web address]  Date: 08/19/2019  Prepared by: Janann August   Finalized pt's HEP, with addition of stepping strategy on foam at countertop and 1/2 tandem stance with wide BOS on foam with eyes closed. Pt also performing HEP that he was given from inpatient rehab in October for core  strengthening. Pt's wife brought in this handout. Verbally reviewed exercises from previous HEP.   Program Notes  AT COUNTERTOP WITH FOAM PILLOW 1 x 10 reps stepping side to side 1 x 10 reps stepping  backwards - look down after every couple and make sure that you are in the middle of your foam pad.  2 reps.  MAKE SURE YOU ARE KEEPING YOUR POSTURE NICE AND TALL.   Exercises Walking March - 3 sets - 2x daily - 6x weekly - with reciprocal arm swing - needed initial verbal and demonstrative cues before performing correctly remainder of sets at countertop.  Tandem Walking - 10 reps - 3 sets - 2x daily - 6x weekly Walking with Head Rotation - 2 reps - 3 sets - 2x daily - 6x weekly Backwards Walking - 10 reps - 3 sets - 2x daily - 6x weekly Single Leg Stance - 3-5 reps - 1 sets - as long as you can hold - 2x daily - 6x weekly Standing Row with Resistance - 10 reps - 2-3 sets - 2x daily - 6x weekly Shoulder extension with resistance - Neutral - 10 reps - 2-3 sets - 2x daily - 6x weekly Doorway Pec Stretch at 90 Degrees Abduction - 3 reps - 1 sets - 30 hold - 2x daily - 6x weekly Romberg Stance Eyes Closed on Foam Pad - 4 sets - 30 hold - 1x daily - 7x weekly Romberg Stance on Foam Pad with Head Rotation - 10 reps - 2 sets - 1x daily - 7x weekly Standing Romberg to 1/2 Tandem Stance - 3 sets - 10 hold - 1x daily - 7x weekly - new addition  Romberg Stance with Head Nods on Foam Pad - 10 reps - 2 sets - 1x daily - 7x weekly                PT Education - 08/19/19 1155    Education Details  final HEP, progress towards goals    Person(s) Educated  Spouse;Patient    Methods  Explanation;Handout;Demonstration    Comprehension  Verbalized understanding;Returned demonstration          PT Long Term Goals - 08/19/19 0942      PT LONG TERM GOAL #1   Title  Pt will be I and compliant with HEP. (Target for all goals 08/20/19)    Time  2    Period  Weeks    Status  Achieved      PT  LONG TERM GOAL #2   Title  Pt will improve FGA to at least 30 to show improved dynamic balance and gait.    Baseline  29/30    Status  Partially Met      PT LONG TERM GOAL #3   Title  Patient will improve miniBEST score to a 28/28 to demo improved stepping strategy balance reaction.    Baseline  28/28 on 08/19/19    Status  Achieved        PHYSICAL THERAPY DISCHARGE SUMMARY  Visits from Start of Care: 13  Current functional level related to goals / functional outcomes: See LTGs.   Remaining deficits: Impaired balance/gait with eyes closed.   Education / Equipment: HEP  Plan: Patient agrees to discharge.  Patient goals were met. Patient is being discharged due to meeting the stated rehab goals.  ?????          Plan - 08/19/19 1202    Clinical Impression Statement  Focus of today's skilled session was assessing LTGs and finalizing HEP for D/C. Pt improved FGA score to 29/30 - indicating improvement in gait with eyes closed, however did have minimal veering to R but no LOB. Pt improved miniBEST  to a 28/28 (previously 26/30) - indicating improvement on lateral and posterior stepping strategy for balance - assessed multiple reps of each. Pt is pleased with his progress in therapy. Both wife and pt verbalize understanding of HEP and importance of performing to maintain gains made in therapy. Pt will be D/C from PT at  this time.    Personal Factors and Comorbidities  Comorbidity 1;Comorbidity 2;Comorbidity 3+    Comorbidities  hyperlipidemia, prostate cancer 2008 with prostatectomy and radiation therapy, CAD with low-dose aspirin.    Examination-Activity Limitations  Locomotion Level;Carry;Squat;Stairs;Lift;Stand    Examination-Participation Restrictions  Meal Prep;Cleaning;Community Activity;Driving;Laundry;Yard Work;Shop    Stability/Clinical Decision Making  Evolving/Moderate complexity    Rehab Potential  Good    PT Frequency  2x / week    PT Duration  6 weeks    PT  Treatment/Interventions  ADLs/Self Care Home Management;Aquatic Therapy;Gait training;Stair training;Functional mobility training;Therapeutic activities;Therapeutic exercise;Balance training;Neuromuscular re-education;Manual techniques    PT Next Visit Plan  D/C    PT Home Exercise Plan  Access Code: [email or web address], added march walking with arm swings    Consulted and Agree with Plan of Care  Patient;Family member/caregiver    Family Member Consulted  wife       Patient will benefit from skilled therapeutic intervention in order to improve the following deficits and impairments:  Abnormal gait, Decreased activity tolerance, Decreased balance, Cardiopulmonary status limiting activity, Decreased endurance, Decreased range of motion, Decreased strength, Difficulty walking, Postural dysfunction  Visit Diagnosis: Other abnormalities of gait and mobility  Muscle weakness (generalized)     Problem List Patient Active Problem List   Diagnosis Date Noted  . Neurodegenerative dementia, with behavioral disturbance ([place]) 07/02/2019  . Slow transit constipation   . Impulsive   . Sleep disturbance   . Prediabetes   . Bradycardia   . Labile blood pressure   . Seizures ([place])   . Hyponatremia 06/18/2019  . Agitation 06/18/2019  . Encephalopathy 06/18/2019  . 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 Sleep apnea, central 03/26/2019  . Transient ischemic attack (TIA) 01/26/2019  . Transient diplopia 12/20/2018  . Dysesthesia affecting both sides of body 12/20/2018  . 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 Snoring 12/20/2018  . Nocturia more than twice per night 12/20/2018  . Insomnia disorder, with other sleep disorder, recurrent 12/20/2018  . Arthritis   . Cancer ([place])   . Elevated troponin   . Syncope 12/26/2016  . Family history of  early CAD 12/26/2016  . Hyperlipidemia 08/15/2011  . PERSONAL HISTORY MALIGNANT NEOPLASM PROSTATE 07/11/2010    Arliss Journey, PT, DPT  08/19/2019, 12:02 PM  [facility] [address] [place], [place], [address] Phone: [phone]   Fax:  [phone]  Name: Trae Bovenzi, MD MRN: [id number] Date of Birth: [birth year]

## 2019-08-23 NOTE — Therapy (Signed)
[facility] [address] [place], [place], [address] Phone: [phone]   Fax:  [phone]  Speech Language Pathology Treatment  Patient Details  Name: Spencer Stiefel, Spencer Underwood MRN: [id number] Date of Birth: April 24, 1941 Referring Provider (SLP): Alger Simons, Spencer Underwood   Encounter Date: 08/17/2019  End of Session - 08/17/19 1448   Visit Number  13    Number of Visits  17    Date for SLP Re-Evaluation  09/27/19    SLP Start Time  1448    SLP Stop Time   1530    SLP Time Calculation (min)  42 min    Activity Tolerance  Patient tolerated treatment well       Past Medical History:  Diagnosis Date  . Allergy   . Arthritis   . Cancer ([place])   . Cataract   . Chronic kidney disease    atrophic left kidney  . Coronary atherosclerosis    a. 2012 - incidentally noted on CT scan of chest;  b. 08/2011 Myoview: normal; c. 03/2015 CPX @ [facility]: normal w/ PAC's, PVC's, and a 3 beat run of NSVT in recovery.  Marland Kitchen Heart murmur    asymptomatic function murmur.  Marland Kitchen History of echocardiogram    a. 12/2004 Echo: Nl EF, trace TR/MR.  Marland Kitchen Hx of radiation therapy   . Hyperlipidemia   . Personal history of malignant neoplasm of prostate    a. 2008 s/p prostatecomtomy-->radiation-->currently on Lupron injections.  . Sinus bradycardia   . Syncope    a. 12/2016    Past Surgical History:  Procedure Laterality Date  . PROSTATECTOMY    . ROBOT ASSISTED LAPAROSCOPIC RADICAL PROSTATECTOMY    . SHOULDER SURGERY Right   . SKIN GRAFT Left   . TONSILLECTOMY      There were no vitals filed for this visit.  Subjective Assessment -  08/17/19 1448   Subjective  Patient arrives with wife    Currently in Pain?  No/denies            ADULT SLP TREATMENT -  08/17/19 1448     General Information   Behavior/Cognition  Alert;Cooperative;Confused;Pleasant mood      Treatment Provided   Treatment provided  Cognitive-Linquistic      Cognitive-Linquistic Treatment   Treatment focused on  Cognition;Patient/family/caregiver education    Skilled Treatment  Patient/wife affirmed they wish to focus on goals of attention and pt's difficulty with wordfinding. Patient reports frustration with not being able to contribute to a conversation because of the time it takes him to respond. He is also frustrated by difficulty with names. Wife wondering if there is "anything else" to work on patient's attention.  SLP explained to pt and wife that given pt's presentation of deficits, feel that our focus should be on compensatory strategies and within a functional context, such as targeting patient's attention in personally relevant tasks (chores/activities at home, use of his calendar, etc). Suggested pt/wife "pre-plan" for group conversations such as pt's book club, and review notes/names of participants immediately prior to the discussion. In cuing pt with calendar today, SLP demonstrated strategies to refocus patient, including removing visual distractions, gaining patient's attention prior to proceeding with verbal cues.       Assessment / Recommendations / Plan   Plan  Continue with current plan of care      Progression Toward Goals   Progression toward goals  Progressing toward goals       SLP Education -  08/17/19 1448   Education Details  ST focus on goal areas will be compensatory vs restorative    Person(s) Educated  Patient;Spouse    Methods  Explanation    Comprehension  Verbalized understanding       SLP Short Term Goals -  08/17/19 1448     SLP SHORT TERM GOAL #1   Title  pt will demonstrate intellectual awareness and tell SLP 3 non-physical deficits via multiple choice, direct questioning, or yes/no questions x3 sessions    Status  Partially Met      SLP SHORT TERM GOAL #2   Title  pt will demo emergent awareness (error awareness) in functional simple-mod complex tasks 90% of the time with modified indpendence (double  checking for errors)    Status  Not Met      SLP SHORT TERM GOAL #3   Title  pt will complete cognitive linguistic testing in first 1-2 sessions    Status  Achieved       SLP Long Term Goals -  08/17/19 1448      SLP LONG TERM GOAL #1   Title  pt will successfully use memory system/strategies to access appointment or social schedule, daily tasks/events, or planned tasks with occasional mod cues and extra time x3 sessions    Time  2    Period  Weeks    Status  On-going   "with occasional mod cues" added     SLP LONG TERM GOAL #2   Title  pt will demo emergent awareness when completing simple cognitive linguistic tasks x 5 sessions    Time  2    Period  Weeks    Status  On-going   "anticiapatory" to "emergent", "mod complex-complex" to "sipmle"     SLP LONG TERM GOAL #3   Title  pt will demo WNL alternating attention between two simple tasks x3 sessions    Time  2    Period  Weeks    Status  On-going   "mod complex" to "simple     SLP LONG TERM GOAL #4   Title  pt will use compensations for attention skills (reported by pt or wife, or during therapy) with cues to do so x 5 dates    Time  2    Period  Weeks    Status  On-going   added "with cues to do so"     SLP LONG TERM GOAL #5   Title  pt will use anomia compensations (if needed, after further informal or formal assessment) in mod comples conversation in 3 sessions    Time  2    Period  Weeks    Status  On-going       Plan -  08/17/19 1448   Clinical Impression Statement  Pt continues to present with significant cognitive communication deficits at least in the areas of memory and awareness, and higher level areas such as executive function and high level problem solving. Pt reports frustration with ability to recall names and express himself in conversation. SLP considering focusing mainly on compensatory measures, and believe pt will benefit from skilled ST targeting this, for caregiver education. Pt/wife to schedule  8 visits; SLP explained that ST to evaluate progress each session and titrate therapy as needed; will consider decreasing frequency or d/c once wife carrying over strategies at home if pt requires commensurate level of cuing session-to-session.    Speech Therapy Frequency  2x / week    Duration  --  8 weeks or 17 visits   Treatment/Interventions  Functional tasks;SLP instruction and feedback;Compensatory strategies;Patient/family education;Internal/external aids;Cognitive reorganization;Cueing hierarchy;Language facilitation;Environmental controls    Potential to Achieve Goals  Good    Potential Considerations  Severity of impairments   ? diagnosis      Patient will benefit from skilled therapeutic intervention in order to improve the following deficits and impairments:   Cognitive communication deficit  Aphasia    Problem List Patient Active Problem List   Diagnosis Date Noted  . Neurodegenerative dementia, with behavioral disturbance ([place]) 07/02/2019  . Slow transit constipation   . Impulsive   . Sleep disturbance   . Prediabetes   . Bradycardia   . Labile blood pressure   . Seizures ([place])   . Hyponatremia 06/18/2019  . Agitation 06/18/2019  . Encephalopathy 06/18/2019  . 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 Sleep apnea, central 03/26/2019  . Transient ischemic attack (TIA) 01/26/2019  . Transient diplopia 12/20/2018  . Dysesthesia affecting both sides of body 12/20/2018  . 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 Snoring 12/20/2018  . Nocturia more than twice per night 12/20/2018  . Insomnia disorder, with other sleep disorder, recurrent 12/20/2018  . Arthritis   . Cancer ([place])   . Elevated troponin   . Syncope 12/26/2016  . Family history of early CAD 12/26/2016  . Hyperlipidemia 08/15/2011  .  PERSONAL HISTORY MALIGNANT NEOPLASM PROSTATE 07/11/2010   Deneise Lever, [place], [place] Speech-Language Pathologist  Aliene Altes 08/23/2019, 7:16 AM  [facility] [address] [place], [place], [address] Phone: [phone]   Fax:  [phone]   Name: Spencer Bowlby, Spencer Underwood MRN: [id number] Date of Birth: 09-23-1940

## 2019-08-31 ENCOUNTER — Other Ambulatory Visit: Payer: Self-pay

## 2019-08-31 ENCOUNTER — Ambulatory Visit: Payer: Medicare Other | Admitting: Speech Pathology

## 2019-08-31 DIAGNOSIS — R41841 Cognitive communication deficit: Secondary | ICD-10-CM

## 2019-08-31 DIAGNOSIS — M6281 Muscle weakness (generalized): Secondary | ICD-10-CM | POA: Diagnosis not present

## 2019-08-31 NOTE — Patient Instructions (Signed)
Spend 20-30 minutes every day reviewing your photos or trip books together and try having a conversation or sharing some memories from the trip.  Bring a book from one of your trips with you next time. Bring your family book with you.  Get back on a regular routine with meals, sleep/wake times, exercises, etc. Schedule short rest periods (20 minutes, use a timer). Schedule more cognitively demanding activities for when you are most alert/awake.   Bring your PT exercises with you next time and we can trouble shoot about how to help you stay focused when you're doing them.

## 2019-08-31 NOTE — Therapy (Signed)
[facility] [address] [place], [place], [address] Phone: [phone]   Fax:  [phone]  Speech Language Pathology Treatment  Patient Details  Name: Spencer Imhof, MD MRN: [id number] Date of Birth: 1941-01-08 Referring Provider (SLP): Alger Simons, MD   Encounter Date: 08/31/2019  End of Session - 08/31/19 1358    Visit Number  14    Number of Visits  17    Date for SLP Re-Evaluation  09/27/19    SLP Start Time  1119   arrived 16 min late   SLP Stop Time   1144    SLP Time Calculation (min)  25 min    Activity Tolerance  Patient tolerated treatment well       Past Medical History:  Diagnosis Date  . Allergy   . Arthritis   . Cancer ([place])   . Cataract   . Chronic kidney disease    atrophic left kidney  . Coronary atherosclerosis    a. 2012 - incidentally noted on CT scan of chest;  b. 08/2011 Myoview: normal; c. 03/2015 CPX @ [facility]: normal w/ PAC's, PVC's, and a 3 beat run of NSVT in recovery.  Marland Kitchen Heart murmur    asymptomatic function murmur.  Marland Kitchen History of echocardiogram    a. 12/2004 Echo: Nl EF, trace TR/MR.  Marland Kitchen Hx of radiation therapy   . Hyperlipidemia   . Personal history of malignant neoplasm of prostate    a. 2008 s/p prostatecomtomy-->radiation-->currently on Lupron injections.  . Sinus bradycardia   . Syncope    a. 12/2016    Past Surgical History:  Procedure Laterality Date  . PROSTATECTOMY    . ROBOT ASSISTED LAPAROSCOPIC RADICAL PROSTATECTOMY    . SHOULDER SURGERY Right   . SKIN GRAFT Left   . TONSILLECTOMY      There were no vitals filed for this visit.  Subjective Assessment - 08/31/19 1119    Subjective  16 minutes late.    Patient is accompained by:  Family member    Currently in Pain?  No/denies            ADULT SLP TREATMENT - 08/31/19 1120      General Information   Behavior/Cognition  Alert;Cooperative;Confused;Pleasant mood      Treatment Provided   Treatment provided  Cognitive-Linquistic      Cognitive-Linquistic Treatment   Treatment focused on  Cognition;Patient/family/caregiver education    Skilled Treatment  Patient checked in 16 minutes after appointment time. Had family visiting and wife lost track of time. Did not bring any materials with them today. Today's session SLP focused on activities pt/wife can do at home to facilitate progress toward goals. Wife shared pt's hobby of photography and making photo books of past travels. SLP suggested pt/wife sit down to review and discuss past trips with picture supports each day, requested pt bring book to next session. Daughter created family book which was lost in the mail; they have a "makeshift" book which wife is to bring next session. Reinforced importance of regular routine/schedule to increase pt's independence. Wife reports increased cognitive fatigue/confusion at the end of the day: "sometimes he thinks he's still working."      Assessment / Recommendations / Plan   Plan  Continue with current plan of care      Progression Toward Goals   Progression toward goals  Progressing toward goals         SLP Short Term Goals - 08/31/19 1359  SLP SHORT TERM GOAL #1   Title  pt will demonstrate intellectual awareness and tell SLP 3 non-physical deficits via multiple choice, direct questioning, or yes/no questions x3 sessions    Status  Partially Met      SLP SHORT TERM GOAL #2   Title  pt will demo emergent awareness (error awareness) in functional simple-mod complex tasks 90% of the time with modified indpendence (double checking for errors)    Status  Not Met      SLP SHORT TERM GOAL #3   Title  pt will complete cognitive linguistic testing in first 1-2 sessions    Status  Achieved       SLP Long Term Goals - 08/31/19 1400      SLP LONG TERM GOAL #1   Title  pt will successfully use memory system/strategies to access appointment or social schedule, daily tasks/events, or  planned tasks with occasional mod cues and extra time x3 sessions    Time  1    Period  Weeks    Status  On-going   "with occasional mod cues" added     SLP LONG TERM GOAL #2   Title  pt will demo emergent awareness when completing simple cognitive linguistic tasks x 5 sessions    Time  1    Period  Weeks    Status  On-going   "anticiapatory" to "emergent", "mod complex-complex" to "sipmle"     SLP LONG TERM GOAL #3   Title  pt will demo WNL alternating attention between two simple tasks x3 sessions    Time  1    Period  Weeks    Status  On-going   "mod complex" to "simple     SLP LONG TERM GOAL #4   Title  pt will use compensations for attention skills (reported by pt or wife, or during therapy) with cues to do so x 5 dates    Time  1    Period  Weeks    Status  On-going   added "with cues to do so"     SLP LONG TERM GOAL #5   Title  pt will use anomia compensations (if needed, after further informal or formal assessment) in mod comples conversation in 3 sessions    Time  1    Period  Weeks    Status  On-going       Plan - 08/31/19 1359    Clinical Impression Statement  Pt continues to present with significant cognitive communication deficits at least in the areas of memory and awareness, and higher level areas such as executive function and high level problem solving. Pt reports frustration with ability to recall names and express himself in conversation. SLP considering focusing mainly on compensatory measures, and believe pt will benefit from skilled ST targeting this, for caregiver education. Pt/wife to schedule 8 visits; SLP explained that ST to evaluate progress each session and titrate therapy as needed; will consider decreasing frequency or d/c once wife carrying over strategies at home if pt requires commensurate level of cuing session-to-session.    Speech Therapy Frequency  2x / week    Duration  --   8 weeks or 17 visits   Treatment/Interventions  Functional  tasks;SLP instruction and feedback;Compensatory strategies;Patient/family education;Internal/external aids;Cognitive reorganization;Cueing hierarchy;Language facilitation;Environmental controls    Potential to Achieve Goals  Good    Potential Considerations  Severity of impairments   ? diagnosis      Patient will benefit from skilled therapeutic  intervention in order to improve the following deficits and impairments:   Cognitive communication deficit    Problem List Patient Active Problem List   Diagnosis Date Noted  . Neurodegenerative dementia, with behavioral disturbance ([place]) 07/02/2019  . Slow transit constipation   . Impulsive   . Sleep disturbance   . Prediabetes   . Bradycardia   . Labile blood pressure   . Seizures ([place])   . Hyponatremia 06/18/2019  . Agitation 06/18/2019  . Encephalopathy 06/18/2019  . 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 Sleep apnea, central 03/26/2019  . Transient ischemic attack (TIA) 01/26/2019  . Transient diplopia 12/20/2018  . Dysesthesia affecting both sides of body 12/20/2018  . 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 Snoring 12/20/2018  . Nocturia more than twice per night 12/20/2018  . Insomnia disorder, with other sleep disorder, recurrent 12/20/2018  . Arthritis   . Cancer ([place])   . Elevated troponin   . Syncope 12/26/2016  . Family history of early CAD 12/26/2016  . Hyperlipidemia 08/15/2011  . PERSONAL HISTORY MALIGNANT NEOPLASM PROSTATE 07/11/2010   Deneise Lever, [place], [place] Speech-Language Pathologist   Aliene Altes 08/31/2019, 2:01 PM  [facility] [address] [place], [place], [address] Phone: [phone]   Fax:  [phone]   Name: Damian Buckles, MD MRN: [id number] Date of  Birth: 1941-04-07

## 2019-09-02 ENCOUNTER — Other Ambulatory Visit: Payer: Self-pay

## 2019-09-02 ENCOUNTER — Ambulatory Visit: Payer: Medicare Other | Admitting: Speech Pathology

## 2019-09-02 DIAGNOSIS — R41841 Cognitive communication deficit: Secondary | ICD-10-CM

## 2019-09-02 DIAGNOSIS — M6281 Muscle weakness (generalized): Secondary | ICD-10-CM | POA: Diagnosis not present

## 2019-09-02 DIAGNOSIS — R4701 Aphasia: Secondary | ICD-10-CM

## 2019-09-02 NOTE — Therapy (Signed)
[facility] [address] [place], [place], [address] Phone: [phone]   Fax:  [phone]  Speech Language Pathology Treatment  Patient Details  Name: Spencer Searcy, Spencer Underwood MRN: [id number] Date of Birth: 04/10/1941 Referring Provider (SLP): Alger Simons, Spencer Underwood   Encounter Date: 09/02/2019  End of Session - 09/02/19 1303    Visit Number  15    Number of Visits  17    Date for SLP Re-Evaluation  09/27/19    SLP Start Time  0932    SLP Stop Time   1231    SLP Time Calculation (min)  46 min    Activity Tolerance  Patient tolerated treatment well       Past Medical History:  Diagnosis Date  . Allergy   . Arthritis   . Cancer ([place])   . Cataract   . Chronic kidney disease    atrophic left kidney  . Coronary atherosclerosis    a. 2012 - incidentally noted on CT scan of chest;  b. 08/2011 Myoview: normal; c. 03/2015 CPX @ [facility]: normal w/ PAC's, PVC's, and a 3 beat run of NSVT in recovery.  Marland Kitchen Heart murmur    asymptomatic function murmur.  Marland Kitchen History of echocardiogram    a. 12/2004 Echo: Nl EF, trace TR/MR.  Marland Kitchen Hx of radiation therapy   . Hyperlipidemia   . Personal history of malignant neoplasm of prostate    a. 2008 s/p prostatecomtomy-->radiation-->currently on Lupron injections.  . Sinus bradycardia   . Syncope    a. 12/2016    Past Surgical History:  Procedure Laterality Date  . PROSTATECTOMY    . ROBOT ASSISTED LAPAROSCOPIC RADICAL PROSTATECTOMY    . SHOULDER SURGERY Right   . SKIN GRAFT Left   . TONSILLECTOMY      There were no vitals filed for this visit.  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  No/denies            ADULT SLP TREATMENT - 09/02/19 1246      General Information   Behavior/Cognition  Alert;Cooperative;Confused;Pleasant mood      Treatment  Provided   Treatment provided  Cognitive-Linquistic      Pain Assessment   Pain Assessment  No/denies pain      Cognitive-Linquistic Treatment   Treatment focused on  Cognition;Patient/family/caregiver education    Skilled Treatment  Patient showed SLP photo album with family photos and descriptions. When asked to ID family members in group photo, patient pointed to images of himself and his wife and stated, "That's my mother and my father." SLP made changes to orientation of text and changed wording to patient's perspective. When pt saw words "my wife, Opal Sidles," pt asked, "Am I married to 2 [place]?" SLP used [employer] (photos and text) as conversational support during discussion re: pt's anniversary trip to [place]. Patient was able to answer simple questions (reason for the trip, locations visited, where he stayed) with occasional mod cues to read the accompanying text. SLP explained to wife that using photo album to assist pt in discussion about familiar topics is a good activity to practice at home, and that looking together/discussing with patient likely to benefit pt more than just allowing him to look at the books by himself.      Assessment / Recommendations /  Plan   Plan  Continue with current plan of care      Progression Toward Goals   Progression toward goals  Progressing toward goals       SLP Education - 09/02/19 1300    Education Details  ST unlikely to rehabilitate pt's cognitive/perceptual deficits but pt will benefit from compensatory aids and strategies    Person(s) Educated  Patient;Spouse    Methods  Explanation    Comprehension  Verbalized understanding       SLP Short Term Goals - 09/02/19 1304      SLP SHORT TERM GOAL #1   Title  pt will demonstrate intellectual awareness and tell SLP 3 non-physical deficits via multiple choice, direct questioning, or yes/no questions x3 sessions    Status  Partially Met      SLP SHORT TERM GOAL #2   Title  pt will  demo emergent awareness (error awareness) in functional simple-mod complex tasks 90% of the time with modified indpendence (double checking for errors)    Status  Not Met      SLP SHORT TERM GOAL #3   Title  pt will complete cognitive linguistic testing in first 1-2 sessions    Status  Achieved       SLP Long Term Goals - 09/02/19 [place] #1   Title  pt will successfully use memory system/strategies to access appointment or social schedule, daily tasks/events, or planned tasks with occasional mod cues and extra time x3 sessions    Time  1    Period  Weeks    Status  On-going   "with occasional mod cues" added     SLP LONG TERM GOAL #2   Title  pt will demo emergent awareness when completing simple cognitive linguistic tasks x 5 sessions    Time  1    Period  Weeks    Status  Deferred   focus on compensatory measures     SLP LONG TERM GOAL #3   Title  pt will demo WNL alternating attention between two simple tasks x3 sessions    Time  1    Period  Weeks    Status  Deferred   focus on compensatory measures     SLP LONG TERM GOAL #4   Title  pt will use compensations for attention skills (reported by pt or wife, or during therapy) with cues to do so x 5 dates    Time  1    Period  Weeks    Status  On-going   added "with cues to do so"     SLP LONG TERM GOAL #5   Title  pt will use anomia compensations (if needed, after further informal or formal assessment) in mod comples conversation in 3 sessions    Time  1    Period  Weeks    Status  On-going       Plan - 09/02/19 1303    Clinical Impression Statement  Pt continues to present with significant cognitive communication deficits at least in the areas of memory and awareness, and higher level areas such as executive function and high level problem solving. Pt reports frustration with ability to recall names and express himself in conversation. Patient difficulty with names appears to be more perceptual  than linguistic. SLP continues to educate pt/wife that pt's deficits not likely to be rehabilitated but that we will continue to focus on improving pt's function with  compensatory aids and strategies. ST to evaluate progress each session and titrate therapy as needed; will consider decreasing frequency or d/c once wife carrying over strategies at home if pt requires commensurate level of cuing session-to-session.    Speech Therapy Frequency  2x / week    Duration  --   8 weeks or 17 visits   Treatment/Interventions  Functional tasks;SLP instruction and feedback;Compensatory strategies;Patient/family education;Internal/external aids;Cognitive reorganization;Cueing hierarchy;Language facilitation;Environmental controls    Potential to Achieve Goals  Good    Potential Considerations  Severity of impairments   ? diagnosis      Patient will benefit from skilled therapeutic intervention in order to improve the following deficits and impairments:   Cognitive communication deficit  Aphasia    Problem List Patient Active Problem List   Diagnosis Date Noted  . Neurodegenerative dementia, with behavioral disturbance ([place]) 07/02/2019  . Slow transit constipation   . Impulsive   . Sleep disturbance   . Prediabetes   . Bradycardia   . Labile blood pressure   . Seizures ([place])   . Hyponatremia 06/18/2019  . Agitation 06/18/2019  . Encephalopathy 06/18/2019  . 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 Sleep apnea, central 03/26/2019  . Transient ischemic attack (TIA) 01/26/2019  . Transient diplopia 12/20/2018  . Dysesthesia affecting both sides of body 12/20/2018  . 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 Snoring 12/20/2018  . Nocturia more than twice per night 12/20/2018  . Insomnia disorder, with other sleep disorder, recurrent  12/20/2018  . Arthritis   . Cancer ([place])   . Elevated troponin   . Syncope 12/26/2016  . Family history of early CAD 12/26/2016  . Hyperlipidemia 08/15/2011  . PERSONAL HISTORY MALIGNANT NEOPLASM PROSTATE 07/11/2010   Deneise Lever, [place], [place] 09/02/2019, 1:06 PM  [facility] [address] [place], [place], [address] Phone: [phone]   Fax:  [phone]   Name: Spencer Bartell, Spencer Underwood MRN: [id number] Date of Birth: October 27, 1940

## 2019-09-07 ENCOUNTER — Ambulatory Visit: Payer: Medicare Other | Admitting: Speech Pathology

## 2019-09-09 ENCOUNTER — Ambulatory Visit: Payer: Medicare Other | Attending: Physical Medicine & Rehabilitation | Admitting: Speech Pathology

## 2019-09-09 ENCOUNTER — Other Ambulatory Visit: Payer: Self-pay

## 2019-09-09 DIAGNOSIS — R41841 Cognitive communication deficit: Secondary | ICD-10-CM | POA: Diagnosis not present

## 2019-09-09 DIAGNOSIS — R4701 Aphasia: Secondary | ICD-10-CM | POA: Diagnosis not present

## 2019-09-09 NOTE — Patient Instructions (Signed)
Simple is the way to go. In conversations (with picture supports), try asking more open ended questions (tell me about these people) vs "confrontational" ("who is this person?").   Use the photo book to support you.  Think about personal details you'd like to include in a book about you (hobbies, family, professional achievements, etc).

## 2019-09-10 NOTE — Therapy (Signed)
[facility] [address] [place], [place], [address] Phone: [phone]   Fax:  [phone]  Speech Language Pathology Treatment  Patient Details  Name: Spencer Mckone, MD MRN: [id number] Date of Birth: 06-15-1941 Referring Provider (SLP): Alger Simons, MD   Encounter Date: 09/09/2019  End of Session - 09/10/19 0939    Visit Number  16    Number of Visits  17    Date for SLP Re-Evaluation  09/27/19    SLP Start Time  0737    SLP Stop Time   1062    SLP Time Calculation (min)  43 min    Activity Tolerance  Patient tolerated treatment well       Past Medical History:  Diagnosis Date  . Allergy   . Arthritis   . Cancer ([place])   . Cataract   . Chronic kidney disease    atrophic left kidney  . Coronary atherosclerosis    a. 2012 - incidentally noted on CT scan of chest;  b. 08/2011 Myoview: normal; c. 03/2015 CPX @ [facility]: normal w/ PAC's, PVC's, and a 3 beat run of NSVT in recovery.  Marland Kitchen Heart murmur    asymptomatic function murmur.  Marland Kitchen History of echocardiogram    a. 12/2004 Echo: Nl EF, trace TR/MR.  Marland Kitchen Hx of radiation therapy   . Hyperlipidemia   . Personal history of malignant neoplasm of prostate    a. 2008 s/p prostatecomtomy-->radiation-->currently on Lupron injections.  . Sinus bradycardia   . Syncope    a. 12/2016    Past Surgical History:  Procedure Laterality Date  . PROSTATECTOMY    . ROBOT ASSISTED LAPAROSCOPIC RADICAL PROSTATECTOMY    . SHOULDER SURGERY Right   . SKIN GRAFT Left   . TONSILLECTOMY      There were no vitals filed for this visit.  Subjective Assessment - 09/10/19 0919    Subjective  Wife prompts pt to pull out photo books.    Patient is accompained by:  Family member   wife Opal Sidles   Currently in Pain?  No/denies            ADULT SLP TREATMENT - 09/10/19 0919      General Information   Behavior/Cognition  Alert;Cooperative;Confused;Pleasant mood      Treatment  Provided   Treatment provided  Cognitive-Linquistic      Pain Assessment   Pain Assessment  No/denies pain      Cognitive-Linquistic Treatment   Treatment focused on  Cognition;Patient/family/caregiver education    Skilled Treatment  Patient attentive during session however confused even when looking at labeled pictures of himself and family members. SLP modified pt's photo book as pt was having difficulty alternating attention between text and photo to match immediate family members in a group photo. Labeled each member individually. Patient had marginal success with this modification; pt ID'd member when SLP stated name aloud 4/4, however pt had difficulty generating names when asked to use photo/labels. SLP provided education to pt and wife that pt's deficits are more likely due to his dx of dementia than small CVA on MRI ([name] on 08/03/19 noted CSF labs positive for presence of Alzheimer's disease changes and noted possibly lewy body disease). SLP explained that goal is for SLP to train wife in how to assist pt with compensations at home and how to adapt schedule and cuing with expected progression vs rehabilitation of pt's deficits. Wife with questions re: cuing procedure when looking at trip  books and family photos. SLP encouraged use of open-ended questions and having pt read text for context vs. confrontational cues.       Assessment / Recommendations / Plan   Plan  Continue with current plan of care      Progression Toward Goals   Progression toward goals  Goals downgraded       SLP Education - 09/10/19 [phone]    Education Details  how to cue pt with compensatory aids; given dementia dx SLP expects pt deficits to progress vs improve    Person(s) Educated  Patient;Spouse    Methods  Explanation;Handout    Comprehension  Verbalized understanding       SLP Short Term Goals - 09/02/19 1304      SLP SHORT TERM GOAL #1   Title  pt will demonstrate intellectual awareness and tell  SLP 3 non-physical deficits via multiple choice, direct questioning, or yes/no questions x3 sessions    Status  Partially Met      SLP SHORT TERM GOAL #2   Title  pt will demo emergent awareness (error awareness) in functional simple-mod complex tasks 90% of the time with modified indpendence (double checking for errors)    Status  Not Met      SLP SHORT TERM GOAL #3   Title  pt will complete cognitive linguistic testing in first 1-2 sessions    Status  Achieved       SLP Long Term Goals - 09/10/19 0935      SLP LONG TERM GOAL #1   Title  pt will successfully use memory system/strategies to access appointment or social schedule, daily tasks/events, or planned tasks with occasional mod cues and extra time x3 sessions    Time  1    Period  Weeks    Status  Deferred   "with occasional mod cues" added     SLP LONG TERM GOAL #2   Title  pt will demo emergent awareness when completing simple cognitive linguistic tasks x 5 sessions    Time  1    Period  Weeks    Status  Deferred   focus on compensatory measures     SLP LONG TERM GOAL #3   Title  pt will demo WNL alternating attention between two simple tasks x3 sessions    Time  1    Period  Weeks    Status  Deferred   focus on compensatory measures     SLP LONG TERM GOAL #4   Title  pt will use compensations for attention skills (reported by pt or wife, or during therapy) with cues to do so x 5 dates    Time  1    Period  Weeks    Status  Deferred   added "with cues to do so"     SLP LONG TERM GOAL #5   Title  pt will use conversational supports (photos, written notes) to assist with simple conversation re: a concrete and familiar topic x 3 sessions.    Time  1    Period  Weeks    Status  Revised      Additional Long Term Goals   Additional Long Term Goals  Yes      SLP LONG TERM GOAL #6   Title  Wife will demonstrate increased confidence in ability to assist pt's verbal expression at home by using appropriate  supports/cues with pt x 2 sessions.    Time  2    Period  Weeks    Status  New       Plan - 09/10/19 0940    Clinical Impression Statement  Pt continues to present with significant cognitive communication deficits at least in the areas of memory and awareness, and higher level areas such as executive function and high level problem solving. Pt reports frustration with ability to recall names and express himself in conversation. Patient difficulty with names appears to be more perceptual than linguistic. 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 SLP continues to educate pt/wife that pt's deficits not likely to be rehabilitated but that we will continue to focus on improving pt's function with compensatory aids and strategies; goals have been downgraded. ST to evaluate progress each session and titrate therapy as needed; will consider decreasing frequency or d/c once wife carrying over strategies at home if pt requires commensurate level of cuing session-to-session.    Speech Therapy Frequency  2x / week    Duration  --   8 weeks or 17 visits   Treatment/Interventions  Functional tasks;SLP instruction and feedback;Compensatory strategies;Patient/family education;Internal/external aids;Cognitive reorganization;Cueing hierarchy;Language facilitation;Environmental controls    Potential to Achieve Goals  Good    Potential Considerations  Severity of impairments   ? diagnosis      Patient will benefit from skilled therapeutic intervention in order to improve the following deficits and impairments:   Cognitive communication deficit  Aphasia    Problem List Patient Active Problem List   Diagnosis Date Noted  . Neurodegenerative dementia, with behavioral disturbance ([place]) 07/02/2019  . Slow transit constipation   . Impulsive   . Sleep disturbance   . Prediabetes   . Bradycardia   . Labile blood pressure   . Seizures  ([place])   . Hyponatremia 06/18/2019  . Agitation 06/18/2019  . Encephalopathy 06/18/2019  . 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 Sleep apnea, central 03/26/2019  . Transient ischemic attack (TIA) 01/26/2019  . Transient diplopia 12/20/2018  . Dysesthesia affecting both sides of body 12/20/2018  . 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 Snoring 12/20/2018  . Nocturia more than twice per night 12/20/2018  . Insomnia disorder, with other sleep disorder, recurrent 12/20/2018  . Arthritis   . Cancer ([place])   . Elevated troponin   . Syncope 12/26/2016  . Family history of early CAD 12/26/2016  . Hyperlipidemia 08/15/2011  . PERSONAL HISTORY MALIGNANT NEOPLASM PROSTATE 07/11/2010   Deneise Lever, [place], [place] Speech-Language Pathologist   Aliene Altes 09/10/2019, 9:43 AM  [facility] [address] [place], [place], [address] Phone: [phone]   Fax:  [phone]   Name: Dawon Troop, MD MRN: [id number] Date of Birth: 11-16-1940

## 2019-09-14 ENCOUNTER — Ambulatory Visit: Payer: Medicare Other | Admitting: Physical Therapy

## 2019-09-14 ENCOUNTER — Other Ambulatory Visit: Payer: Self-pay

## 2019-09-14 ENCOUNTER — Ambulatory Visit: Payer: Medicare Other | Admitting: Speech Pathology

## 2019-09-14 DIAGNOSIS — R4701 Aphasia: Secondary | ICD-10-CM

## 2019-09-14 DIAGNOSIS — R41841 Cognitive communication deficit: Secondary | ICD-10-CM

## 2019-09-15 DIAGNOSIS — Z23 Encounter for immunization: Secondary | ICD-10-CM | POA: Diagnosis not present

## 2019-09-15 DIAGNOSIS — M79645 Pain in left finger(s): Secondary | ICD-10-CM | POA: Diagnosis not present

## 2019-09-15 DIAGNOSIS — S63257D Unspecified dislocation of left little finger, subsequent encounter: Secondary | ICD-10-CM | POA: Diagnosis not present

## 2019-09-15 NOTE — Therapy (Signed)
[name] [address] [place], [place], [address] Phone: [phone]   Fax:  [phone]  Speech Language Pathology Treatment  Patient Details  Name: Spencer Hopping, MD MRN: [id number] Date of Birth: 12/11/1940 Referring Provider (SLP): Alger Simons, MD   Encounter Date: 09/14/2019  End of Session - 09/15/19 1830    Visit Number  17    Number of Visits  20    Date for SLP Re-Evaluation  10/15/19    SLP Start Time  1700    SLP Stop Time   1750    SLP Time Calculation (min)  50 min    Activity Tolerance  Patient tolerated treatment well;Other (comment)   fatigue + increased confusion last 10 minutes of session      Past Medical History:  Diagnosis Date  . Allergy   . Arthritis   . Cancer ([place])   . Cataract   . Chronic kidney disease    atrophic left kidney  . Coronary atherosclerosis    a. 2012 - incidentally noted on CT scan of chest;  b. 08/2011 Myoview: normal; c. 03/2015 CPX @ [facility]: normal w/ PAC's, PVC's, and a 3 beat run of NSVT in recovery.  Marland Kitchen Heart murmur    asymptomatic function murmur.  Marland Kitchen History of echocardiogram    a. 12/2004 Echo: Nl EF, trace TR/MR.  Marland Kitchen Hx of radiation therapy   . Hyperlipidemia   . Personal history of malignant neoplasm of prostate    a. 2008 s/p prostatecomtomy-->radiation-->currently on Lupron injections.  . Sinus bradycardia   . Syncope    a. 12/2016    Past Surgical History:  Procedure Laterality Date  . PROSTATECTOMY    . ROBOT ASSISTED LAPAROSCOPIC RADICAL PROSTATECTOMY    . SHOULDER SURGERY Right   . SKIN GRAFT Left   . TONSILLECTOMY      There were no vitals filed for this visit.  Subjective Assessment - 09/14/19 1705    Subjective  "This is not my book."    Patient is accompained by:  Family member   wife Opal Sidles   Currently in Pain?  No/denies            ADULT SLP TREATMENT - 09/15/19 1801      General Information   Behavior/Cognition   Cooperative;Confused   lethargic last 10 minutes of session     Treatment Provided   Treatment provided  Cognitive-Linquistic      Cognitive-Linquistic Treatment   Treatment focused on  Cognition;Patient/family/caregiver education;Aphasia    Skilled Treatment  Patient/wife brought photo book with text pt created for an overseas trip in 2016. SLP demonstrated how to use book conversationally with patient, inviting pt comments, asking open-ended questions, and cuing pt verbally/gesturally to read text to answer simple questions. SLP educated that the purpose of this activity is not to "quiz" or have pt "relearn" events or names he has forgotten, but to have a positive communication experience with visual/text supports. SLP encouraged pt/wife to do this at home daily.      Assessment / Recommendations / Plan   Plan  Continue with current plan of care   pt requires same level of cuing; to focus on caregiver ed     Progression Toward Goals   Progression toward goals  --   anticipate 2-3 more sessions needed for caregiver education      SLP Education - 09/15/19 1830    Education Details  use of visuals and text to support  and affirm pt competence with communication    Person(s) Educated  Patient;Spouse    Methods  Explanation;Demonstration;Verbal cues    Comprehension  Verbalized understanding;Need further instruction       SLP Short Term Goals - 09/15/19 1833      SLP SHORT TERM GOAL #1   Title  pt will demonstrate intellectual awareness and tell SLP 3 non-physical deficits via multiple choice, direct questioning, or yes/no questions x3 sessions    Status  Partially Met      SLP SHORT TERM GOAL #2   Title  pt will demo emergent awareness (error awareness) in functional simple-mod complex tasks 90% of the time with modified indpendence (double checking for errors)    Status  Not Met      SLP SHORT TERM GOAL #3   Title  pt will complete cognitive linguistic testing in first 1-2  sessions    Status  Achieved       SLP Long Term Goals - 09/15/19 1833      SLP LONG TERM GOAL #1   Title  pt will successfully use memory system/strategies to access appointment or social schedule, daily tasks/events, or planned tasks with occasional mod cues and extra time x3 sessions    Time  1    Period  Weeks    Status  Deferred   "with occasional mod cues" added     SLP LONG TERM GOAL #2   Title  pt will demo emergent awareness when completing simple cognitive linguistic tasks x 5 sessions    Time  1    Period  Weeks    Status  Deferred   focus on compensatory measures     SLP LONG TERM GOAL #3   Title  pt will demo WNL alternating attention between two simple tasks x3 sessions    Time  1    Period  Weeks    Status  Deferred   focus on compensatory measures     SLP LONG TERM GOAL #4   Title  pt will use compensations for attention skills (reported by pt or wife, or during therapy) with cues to do so x 5 dates    Time  1    Period  Weeks    Status  Deferred   added "with cues to do so"     SLP LONG TERM GOAL #5   Title  pt will use conversational supports (photos, written notes) to assist with simple conversation re: a concrete and familiar topic x 3 sessions.    Baseline  09/14/19    Time  1    Period  Weeks    Status  On-going      SLP LONG TERM GOAL #6   Title  Wife will demonstrate increased confidence in ability to assist pt's verbal expression at home by using appropriate supports/cues with pt x 2 sessions.    Time  2    Period  Weeks    Status  On-going       Plan - 09/15/19 1831    Clinical Impression Statement  Pt continues to present with significant cognitive communication deficits at least in the areas of memory and awareness, and higher level areas such as executive function and high level problem solving. Pt reports frustration with ability to recall names and express himself in conversation. Neurology noted on 08/03/19 presence of Alzheimer's  disease changes in CSF labs as well as possibly lewy body disease. SLP continues to educate pt/wife that  pt's deficits not likely to be rehabilitated but that we will continue to focus on improving pt's function with compensatory aids and strategies; goals have been downgraded. Anticipate 2-3 more sessions needed for caregiver training; renewal completed today for 3 additional visits.    Speech Therapy Frequency  2x / week    Duration  --   total of 20 visits   Treatment/Interventions  Functional tasks;SLP instruction and feedback;Compensatory strategies;Patient/family education;Internal/external aids;Cognitive reorganization;Cueing hierarchy;Language facilitation;Environmental controls    Potential to Achieve Goals  Good    Potential Considerations  Severity of impairments   ? diagnosis      Patient will benefit from skilled therapeutic intervention in order to improve the following deficits and impairments:   Cognitive communication deficit  Aphasia    Problem List Patient Active Problem List   Diagnosis Date Noted  . Neurodegenerative dementia, with behavioral disturbance ([place]) 07/02/2019  . Slow transit constipation   . Impulsive   . Sleep disturbance   . Prediabetes   . Bradycardia   . Labile blood pressure   . Seizures ([place])   . Hyponatremia 06/18/2019  . Agitation 06/18/2019  . Encephalopathy 06/18/2019  . 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 Sleep apnea, central 03/26/2019  . Transient ischemic attack (TIA) 01/26/2019  . Transient diplopia 12/20/2018  . Dysesthesia affecting both sides of body 12/20/2018  . 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 Snoring 12/20/2018  . Nocturia more than twice per night 12/20/2018  . Insomnia disorder, with other sleep disorder, recurrent 12/20/2018  . Arthritis   .  Cancer ([place])   . Elevated troponin   . Syncope 12/26/2016  . Family history of early CAD 12/26/2016  . Hyperlipidemia 08/15/2011  . PERSONAL HISTORY MALIGNANT NEOPLASM PROSTATE 07/11/2010   Deneise Lever, [place], [place] 09/15/2019, 6:34 PM  [facility] [address] [place], [place], [address] Phone: [phone]   Fax:  [phone]   Name: Tatum Massman, MD MRN: [id number] Date of Birth: 04-Dec-1940

## 2019-09-16 ENCOUNTER — Ambulatory Visit: Payer: Medicare Other | Admitting: Speech Pathology

## 2019-09-16 ENCOUNTER — Other Ambulatory Visit: Payer: Self-pay

## 2019-09-16 DIAGNOSIS — R4701 Aphasia: Secondary | ICD-10-CM | POA: Diagnosis not present

## 2019-09-16 DIAGNOSIS — R41841 Cognitive communication deficit: Secondary | ICD-10-CM

## 2019-09-17 ENCOUNTER — Other Ambulatory Visit: Payer: Self-pay

## 2019-09-17 DIAGNOSIS — C61 Malignant neoplasm of prostate: Secondary | ICD-10-CM | POA: Diagnosis not present

## 2019-09-17 DIAGNOSIS — E291 Testicular hypofunction: Secondary | ICD-10-CM | POA: Diagnosis not present

## 2019-09-17 DIAGNOSIS — E7849 Other hyperlipidemia: Secondary | ICD-10-CM | POA: Diagnosis not present

## 2019-09-17 DIAGNOSIS — Z125 Encounter for screening for malignant neoplasm of prostate: Secondary | ICD-10-CM | POA: Diagnosis not present

## 2019-09-17 NOTE — Patient Outreach (Signed)
First telephone outreach attempt to obtain mRs. No answer. Left message for returned call.   Rogue Pautler Care Management Assistant  

## 2019-09-17 NOTE — Therapy (Signed)
[facility] [address] [place], [place], [address] Phone: [phone]   Fax:  [phone]  Speech Language Pathology Treatment  Patient Details  Name: Spencer Atkerson, Spencer Underwood MRN: [id number] Date of Birth: [birth year] Referring Provider (SLP): Alger Simons, Spencer Underwood   Encounter Date: 09/16/2019  End of Session - 09/17/19 0918    Visit Number  18    Number of Visits  20    Date for SLP Re-Evaluation  10/15/19    SLP Start Time  1147    SLP Stop Time   1230    SLP Time Calculation (min)  43 min    Activity Tolerance  Patient tolerated treatment well       Past Medical History:  Diagnosis Date  . Allergy   . Arthritis   . Cancer ([place])   . Cataract   . Chronic kidney disease    atrophic left kidney  . Coronary atherosclerosis    a. 2012 - incidentally noted on CT scan of chest;  b. 08/2011 Myoview: normal; c. 03/2015 CPX @ [facility]: normal w/ PAC's, PVC's, and a 3 beat run of NSVT in recovery.  Marland Kitchen Heart murmur    asymptomatic function murmur.  Marland Kitchen History of echocardiogram    a. 12/2004 Echo: Nl EF, trace TR/MR.  Marland Kitchen Hx of radiation therapy   . Hyperlipidemia   . Personal history of malignant neoplasm of prostate    a. 2008 s/p prostatecomtomy-->radiation-->currently on Lupron injections.  . Sinus bradycardia   . Syncope    a. 12/2016    Past Surgical History:  Procedure Laterality Date  . PROSTATECTOMY    . ROBOT ASSISTED LAPAROSCOPIC RADICAL PROSTATECTOMY    . SHOULDER SURGERY Right   . SKIN GRAFT Left   . TONSILLECTOMY      There were no vitals filed for this visit.  Subjective Assessment - 09/17/19 0910    Subjective  "he picked this out to bring to you." (pt's wife, re: photo book)    Patient is accompained by:  Family member   Wife Opal Sidles           ADULT SLP TREATMENT - 09/17/19 0911      General Information   Behavior/Cognition  Alert;Cooperative;Pleasant mood;Confused      Treatment Provided    Treatment provided  Cognitive-Linquistic      Pain Assessment   Pain Assessment  No/denies pain      Cognitive-Linquistic Treatment   Treatment focused on  Patient/family/caregiver education;Aphasia;Cognition    Skilled Treatment  Patient/wife brought photo book with text pt created for an overseas trip in [place]. SLP demonstrated how to use book conversationally with patient, inviting pt comments, asking open-ended questions, and cuing pt verbally/gesturally to read text to answer simple questions. SLP educated that the purpose of this activity is not to "quiz" or have pt "relearn" events or names he has forgotten, but to have a positive communication experience with visual/text supports. SLP encouraged pt/wife to do this at home daily.      Assessment / Recommendations / Plan   Plan  Continue with current plan of care   anticipate 1-2 more sessions necessary for caregiver educati     Progression Toward Goals   Progression toward goals  Progressing toward goals   pt requires same level of cuing; focus on caregiver training      SLP Education - 09/17/19 0917    Education Details  offer space for patient to make comments, use open-ended  questions and observations, text to affirm pt's compentence    Person(s) Educated  Patient;Spouse    Methods  Explanation;Demonstration;Verbal cues    Comprehension  Verbalized understanding;Need further instruction       SLP Short Term Goals - 09/17/19 0919      SLP SHORT TERM GOAL #1   Title  pt will demonstrate intellectual awareness and tell SLP 3 non-physical deficits via multiple choice, direct questioning, or yes/no questions x3 sessions    Status  Partially Met      SLP SHORT TERM GOAL #2   Title  pt will demo emergent awareness (error awareness) in functional simple-mod complex tasks 90% of the time with modified indpendence (double checking for errors)    Status  Not Met      SLP SHORT TERM GOAL #3   Title  pt will complete cognitive  linguistic testing in first 1-2 sessions    Status  Achieved       SLP Long Term Goals - 09/17/19 0919      SLP LONG TERM GOAL #1   Title  pt will successfully use memory system/strategies to access appointment or social schedule, daily tasks/events, or planned tasks with occasional mod cues and extra time x3 sessions    Time  1    Period  Weeks    Status  Deferred   "with occasional mod cues" added     SLP LONG TERM GOAL #2   Title  pt will demo emergent awareness when completing simple cognitive linguistic tasks x 5 sessions    Time  1    Period  Weeks    Status  Deferred   focus on compensatory measures     SLP LONG TERM GOAL #3   Title  pt will demo WNL alternating attention between two simple tasks x3 sessions    Time  1    Period  Weeks    Status  Deferred   focus on compensatory measures     SLP LONG TERM GOAL #4   Title  pt will use compensations for attention skills (reported by pt or wife, or during therapy) with cues to do so x 5 dates    Time  1    Period  Weeks    Status  Deferred   added "with cues to do so"     SLP LONG TERM GOAL #5   Title  pt will use conversational supports (photos, written notes) to assist with simple conversation re: a concrete and familiar topic x 3 sessions.    Baseline  09/14/19 09/16/19    Time  1    Period  Weeks    Status  On-going      SLP LONG TERM GOAL #6   Title  Wife will demonstrate increased confidence in ability to assist pt's verbal expression at home by using appropriate supports/cues with pt x 2 sessions.    Time  2    Period  Weeks    Status  On-going       Plan - 09/17/19 0919    Clinical Impression Statement  Pt continues to present with significant cognitive communication deficits at least in the areas of memory and awareness, and higher level areas such as executive function and high level problem solving. Pt reports frustration with ability to recall names and express himself in conversation. 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 SLP continues to  educate pt/wife that pt's deficits not likely to be rehabilitated but that we will continue to focus on improving pt's function with compensatory aids and strategies; goals have been downgraded. Anticipate 1-2 more sessions needed for caregiver training.    Speech Therapy Frequency  2x / week    Duration  --   total of 20 visits   Treatment/Interventions  Functional tasks;SLP instruction and feedback;Compensatory strategies;Patient/family education;Internal/external aids;Cognitive reorganization;Cueing hierarchy;Language facilitation;Environmental controls    Potential to Achieve Goals  Good    Potential Considerations  Severity of impairments   ? diagnosis      Patient will benefit from skilled therapeutic intervention in order to improve the following deficits and impairments:   Cognitive communication deficit  Aphasia    Problem List Patient Active Problem List   Diagnosis Date Noted  . Neurodegenerative dementia, with behavioral disturbance ([place]) 07/02/2019  . Slow transit constipation   . Impulsive   . Sleep disturbance   . Prediabetes   . Bradycardia   . Labile blood pressure   . Seizures ([place])   . Hyponatremia 06/18/2019  . Agitation 06/18/2019  . Encephalopathy 06/18/2019  . 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 Sleep apnea, central 03/26/2019  . Transient ischemic attack (TIA) 01/26/2019  . Transient diplopia 12/20/2018  . Dysesthesia affecting both sides of body 12/20/2018  . 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 Snoring 12/20/2018  . Nocturia more than twice per night 12/20/2018  . Insomnia disorder, with other sleep disorder, recurrent 12/20/2018  . Arthritis   . Cancer ([place])    . Elevated troponin   . Syncope 12/26/2016  . Family history of early CAD 12/26/2016  . Hyperlipidemia 08/15/2011  . PERSONAL HISTORY MALIGNANT NEOPLASM PROSTATE 07/11/2010   Deneise Lever, [place], [place] Speech-Language Pathologist  Aliene Altes 09/17/2019, 9:21 AM  [facility] [address] [place], [place], [address] Phone: [phone]   Fax:  [phone]   Name: Spencer Miralles, Spencer Underwood MRN: [id number] Date of Birth: October 18, 1940

## 2019-09-21 ENCOUNTER — Other Ambulatory Visit: Payer: Self-pay

## 2019-09-21 ENCOUNTER — Ambulatory Visit: Payer: Medicare Other | Admitting: Physical Therapy

## 2019-09-21 ENCOUNTER — Ambulatory Visit: Payer: Medicare Other | Admitting: Speech Pathology

## 2019-09-21 DIAGNOSIS — R41841 Cognitive communication deficit: Secondary | ICD-10-CM | POA: Diagnosis not present

## 2019-09-21 DIAGNOSIS — R4701 Aphasia: Secondary | ICD-10-CM | POA: Diagnosis not present

## 2019-09-21 NOTE — Therapy (Signed)
[facility] [address] [place], [place], [address] Phone: [phone]   Fax:  [phone]  Speech Language Pathology Treatment  Patient Details  Name: Spencer Tijerina, Spencer Underwood MRN: [id number] Date of Birth: June 04, 1941 Referring Provider (SLP): Alger Simons, Spencer Underwood   Encounter Date: 09/21/2019  End of Session - 09/21/19 1237    Visit Number  19    Number of Visits  20    Date for SLP Re-Evaluation  10/15/19    SLP Start Time  1150    SLP Stop Time   1237    SLP Time Calculation (min)  47 min    Activity Tolerance  Patient tolerated treatment well       Past Medical History:  Diagnosis Date  . Allergy   . Arthritis   . Cancer ([place])   . Cataract   . Chronic kidney disease    atrophic left kidney  . Coronary atherosclerosis    a. 2012 - incidentally noted on CT scan of chest;  b. 08/2011 Myoview: normal; c. 03/2015 CPX @ [facility]: normal w/ PAC's, PVC's, and a 3 beat run of NSVT in recovery.  Marland Kitchen Heart murmur    asymptomatic function murmur.  Marland Kitchen History of echocardiogram    a. 12/2004 Echo: Nl EF, trace TR/MR.  Marland Kitchen Hx of radiation therapy   . Hyperlipidemia   . Personal history of malignant neoplasm of prostate    a. 2008 s/p prostatecomtomy-->radiation-->currently on Lupron injections.  . Sinus bradycardia   . Syncope    a. 12/2016    Past Surgical History:  Procedure Laterality Date  . PROSTATECTOMY    . ROBOT ASSISTED LAPAROSCOPIC RADICAL PROSTATECTOMY    . SHOULDER SURGERY Right   . SKIN GRAFT Left   . TONSILLECTOMY      There were no vitals filed for this visit.  Subjective Assessment - 09/21/19 1303    Subjective  "This is our favorite part of coming here."    Patient is accompained by:  Family member   Wife Spencer Underwood   Currently in Pain?  No/denies            ADULT SLP TREATMENT - 09/21/19 1254      General Information   Behavior/Cognition  Alert;Cooperative;Pleasant mood;Confused      Treatment Provided   Treatment provided  Cognitive-Linquistic      Pain Assessment   Pain Assessment  No/denies pain      Cognitive-Linquistic Treatment   Treatment focused on  Patient/family/caregiver education;Aphasia;Cognition    Skilled Treatment  Pt's wife demo'd increased confidence cuing pt this session with use of photo book supports. Wife cued pt to read text when talking about a trip, and had labeled group photographs with names of friends. Pt required visual cues from SLP to name members of the photo, due to decreased attention. SLP demo'd how to support pt's attention in this task. Wife returned demonstration by pointing to text for pt to read if pt did not follow verbal command for subsequent pages. At end of session wife reported they have not used pt's weekly calendar in several weeks; SLP suggested using a simplified daily schedule/checklist vs weekly schedule. To provide template next session.       Assessment / Recommendations / Plan   Plan  Continue with current plan of care      Progression Toward Goals   Progression toward goals  Progressing toward goals       SLP Education - 09/21/19 1302  Education Details  how to support pt's attention during communication tasks, use of simplified vs complex schedule    Person(s) Educated  Patient;Spouse    Methods  Explanation;Verbal cues;Demonstration    Comprehension  Verbalized understanding       SLP Short Term Goals - 09/21/19 1240      SLP SHORT TERM GOAL #1   Title  pt will demonstrate intellectual awareness and tell SLP 3 non-physical deficits via multiple choice, direct questioning, or yes/no questions x3 sessions    Status  Partially Met      SLP SHORT TERM GOAL #2   Title  pt will demo emergent awareness (error awareness) in functional simple-mod complex tasks 90% of the time with modified indpendence (double checking for errors)    Status  Not Met      SLP SHORT TERM GOAL #3   Title  pt will complete  cognitive linguistic testing in first 1-2 sessions    Status  Achieved       SLP Long Term Goals - 09/21/19 [place] #1   Title  pt will successfully use memory system/strategies to access appointment or social schedule, daily tasks/events, or planned tasks with occasional mod cues and extra time x3 sessions    Time  1    Period  Weeks    Status  Deferred   "with occasional mod cues" added     SLP LONG TERM GOAL #2   Title  pt will demo emergent awareness when completing simple cognitive linguistic tasks x 5 sessions    Time  1    Period  Weeks    Status  Deferred   focus on compensatory measures     SLP LONG TERM GOAL #3   Title  pt will demo WNL alternating attention between two simple tasks x3 sessions    Time  1    Period  Weeks    Status  Deferred   focus on compensatory measures     SLP LONG TERM GOAL #4   Title  pt will use compensations for attention skills (reported by pt or wife, or during therapy) with cues to do so x 5 dates    Time  1    Period  Weeks    Status  Deferred   added "with cues to do so"     SLP LONG TERM GOAL #5   Title  pt will use conversational supports (photos, written notes) to assist with simple conversation re: a concrete and familiar topic x 3 sessions.    Baseline  09/14/19 09/16/19 09/21/19    Time  1    Period  Weeks    Status  Achieved      SLP LONG TERM GOAL #6   Title  Wife will demonstrate increased confidence in ability to assist pt's verbal expression at home by using appropriate supports/cues with pt x 2 sessions.    Time  2    Period  Weeks    Status  On-going       Plan - 09/21/19 1240    Clinical Impression Statement  Pt continues to present with significant cognitive communication deficits at least in the areas of memory and awareness, and higher level areas such as executive function and high level problem solving. Pt reports frustration with ability to recall names and express himself in  conversation. 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 SLP continues to educate pt/wife that pt's deficits not likely to be rehabilitated but that we will continue to focus on improving pt's function with compensatory aids and strategies. Anticipate d/c next session.    Speech Therapy Frequency  2x / week    Duration  --   total of 20 visits   Treatment/Interventions  Functional tasks;SLP instruction and feedback;Compensatory strategies;Patient/family education;Internal/external aids;Cognitive reorganization;Cueing hierarchy;Language facilitation;Environmental controls    Potential to Achieve Goals  Good    Potential Considerations  Severity of impairments   ? diagnosis      Patient will benefit from skilled therapeutic intervention in order to improve the following deficits and impairments:   Cognitive communication deficit    Problem List Patient Active Problem List   Diagnosis Date Noted  . Neurodegenerative dementia, with behavioral disturbance ([place]) 07/02/2019  . Slow transit constipation   . Impulsive   . Sleep disturbance   . Prediabetes   . Bradycardia   . Labile blood pressure   . Seizures ([place])   . Hyponatremia 06/18/2019  . Agitation 06/18/2019  . Encephalopathy 06/18/2019  . 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 Sleep apnea, central 03/26/2019  . Transient ischemic attack (TIA) 01/26/2019  . Transient diplopia 12/20/2018  . Dysesthesia affecting both sides of body 12/20/2018  . 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 Snoring 12/20/2018  . Nocturia more than twice per night 12/20/2018  . Insomnia disorder, with other sleep disorder, recurrent 12/20/2018  . Arthritis   . Cancer ([place])   . Elevated troponin   . Syncope  12/26/2016  . Family history of early CAD 12/26/2016  . Hyperlipidemia 08/15/2011  . PERSONAL HISTORY MALIGNANT NEOPLASM PROSTATE 07/11/2010   Deneise Lever, [place], [place] Speech-Language Pathologist  Aliene Altes 09/21/2019, 1:04 PM  [facility] [address] [place], [place], [address] Phone: [phone]   Fax:  [phone]   Name: Spencer Nhan, Spencer Underwood MRN: [id number] Date of Birth: 11-27-1940

## 2019-09-22 ENCOUNTER — Other Ambulatory Visit: Payer: Self-pay

## 2019-09-22 NOTE — Patient Outreach (Signed)
Second telephone outreach attempt to obtain mRs. Spoke with spouse per DPR who informed now was not a good time and she would like a call back within a hour or so. CMA agreed.   [facility] Management Assistant

## 2019-09-22 NOTE — Patient Outreach (Signed)
Telephone outreach to caregiver to obtain mRS was successfully completed. MRS=3  Spouse added she did not believe patient had a stroke and referred CMA to 08/03/2019 visit with Dr. Georgetta Haber with [facility] Management Assistant

## 2019-09-23 ENCOUNTER — Ambulatory Visit: Payer: Medicare Other | Admitting: Speech Pathology

## 2019-09-23 ENCOUNTER — Other Ambulatory Visit: Payer: Self-pay

## 2019-09-23 ENCOUNTER — Ambulatory Visit: Payer: Medicare Other | Admitting: Physical Therapy

## 2019-09-23 DIAGNOSIS — R4701 Aphasia: Secondary | ICD-10-CM

## 2019-09-23 DIAGNOSIS — R41841 Cognitive communication deficit: Secondary | ICD-10-CM

## 2019-09-23 NOTE — Therapy (Signed)
[facility] [address] [place], [place], [address] Phone: [phone]   Fax:  [phone]  Speech Language Pathology Treatment and Discharge Summary  Patient Details  Name: Spencer Difiore, MD MRN: [id number] Date of Birth: 1941-08-12 Referring Provider (SLP): Alger Simons, MD   Encounter Date: 09/23/2019  End of Session - 09/23/19 1335    Visit Number  20    Number of Visits  20    Date for SLP Re-Evaluation  10/15/19    SLP Start Time  1016    SLP Stop Time   1103    SLP Time Calculation (min)  47 min    Activity Tolerance  Patient tolerated treatment well       Past Medical History:  Diagnosis Date  . Allergy   . Arthritis   . Cancer ([place])   . Cataract   . Chronic kidney disease    atrophic left kidney  . Coronary atherosclerosis    a. 2012 - incidentally noted on CT scan of chest;  b. 08/2011 Myoview: normal; c. 03/2015 CPX @ [facility]: normal w/ PAC's, PVC's, and a 3 beat run of NSVT in recovery.  [facility] Kitchen Heart murmur    asymptomatic function murmur.  [facility] Kitchen History of echocardiogram    a. 12/2004 Echo: Nl EF, trace TR/MR.  [facility] Kitchen Hx of radiation therapy   . Hyperlipidemia   . Personal history of malignant neoplasm of prostate    a. 2008 s/p prostatecomtomy-->radiation-->currently on Lupron injections.  . Sinus bradycardia   . Syncope    a. 12/2016    Past Surgical History:  Procedure Laterality Date  . PROSTATECTOMY    . ROBOT ASSISTED LAPAROSCOPIC RADICAL PROSTATECTOMY    . SHOULDER SURGERY Right   . SKIN GRAFT Left   . TONSILLECTOMY      There were no vitals filed for this visit.  Subjective Assessment - 09/23/19 1333    Subjective  "We didn't bring anything today."    Patient is accompained by:  Family member   wife Opal Sidles   Currently in Pain?  No/denies            ADULT SLP TREATMENT - 09/23/19 1334      General Information   Behavior/Cognition  Alert;Cooperative;Pleasant mood;Confused      Treatment Provided   Treatment provided  Cognitive-Linquistic      Pain Assessment   Pain Assessment  No/denies pain      Cognitive-Linquistic Treatment   Treatment focused on  Patient/family/caregiver education;Aphasia;Cognition    Skilled Treatment  Pt's wife reports increased confidence in cuing pt with photo books and in conversation. Wife had questions re: patient schedule; they have been looking daily at calendar since previous session. Her greatest concern is "downtime" when Cordelro tends to get anxious and try to find things to do. At times he becomes confused and thinks he should be going to work. SLP showed wife a daily schedule template and discussed how she might use this with [name]. She would like to continue using their calendar. SLP encouraged her to allow Rasheem to choose some activities from a list in his downtime and write them on his schedule. Created account with [employer] for free language-cognition activities pt can do on his laptop. Dacotah was able to access activities SLP programmed for him with min A, and used keyboard mouse to complete simple tasks (letter recall 80% accuracy, name the category 90% accuracy, general knowledge questions 60% accuracy). Pt/wife agreed this would be  an engaging activity pt can do at home, as he has been unable to play bridge, solitare, or other games he enjoyed previously.       Assessment / Recommendations / Plan   Plan  Discharge SLP treatment due to (comment)   goals met     Progression Toward Goals   Progression toward goals  Goals met, education completed, patient discharged from [facility] - 09/23/19 1334    Education Details  use of schedule at home, activities to stimulate pt's cognitive-communication abilities at home    Person(s) Educated  Patient;Spouse    Methods  Explanation    Comprehension  Verbalized understanding       SLP Short Term Goals - 09/23/19 1340      SLP SHORT TERM GOAL #1   Title  pt  will demonstrate intellectual awareness and tell SLP 3 non-physical deficits via multiple choice, direct questioning, or yes/no questions x3 sessions    Status  Partially Met      SLP SHORT TERM GOAL #2   Title  pt will demo emergent awareness (error awareness) in functional simple-mod complex tasks 90% of the time with modified indpendence (double checking for errors)    Status  Not Met      SLP SHORT TERM GOAL #3   Title  pt will complete cognitive linguistic testing in first 1-2 sessions    Status  Achieved       SLP Long Term Goals - 09/23/19 [facility] #1   Title  pt will successfully use memory system/strategies to access appointment or social schedule, daily tasks/events, or planned tasks with occasional mod cues and extra time x3 sessions    Time  1    Period  Weeks    Status  Deferred   "with occasional mod cues" added     SLP LONG TERM GOAL #2   Title  pt will demo emergent awareness when completing simple cognitive linguistic tasks x 5 sessions    Time  1    Period  Weeks    Status  Deferred   focus on compensatory measures     SLP LONG TERM GOAL #3   Title  pt will demo WNL alternating attention between two simple tasks x3 sessions    Time  1    Period  Weeks    Status  Deferred   focus on compensatory measures     SLP LONG TERM GOAL #4   Title  pt will use compensations for attention skills (reported by pt or wife, or during therapy) with cues to do so x 5 dates    Time  1    Period  Weeks    Status  Deferred   added "with cues to do so"     SLP LONG TERM GOAL #5   Title  pt will use conversational supports (photos, written notes) to assist with simple conversation re: a concrete and familiar topic x 3 sessions.    Baseline  09/14/19 09/16/19 09/21/19    Time  1    Period  Weeks    Status  Achieved      SLP LONG TERM GOAL #6   Title  Wife will demonstrate increased confidence in ability to assist pt's verbal expression at home by using  appropriate supports/cues with pt x 2 sessions.    Time  2    Period  Weeks  Status  Achieved       Plan - 09/23/19 1335    Clinical Impression Statement  Pt continues to present with significant cognitive communication deficits at least in the areas of memory and awareness, and higher level areas such as executive function and high level problem solving. 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 SLP has educated pt/wife that pt's deficits not likely to be rehabilitated but trained them in improving pt's function with compensatory aids and strategies. Education completed re: activities to stimulate cognition at home; online account has been set up for pt for home tasks. SLP educated wife that [facility] may request referral in ~6 months should caregiver need additional training if cognitive abilities deteriorate.    Speech Therapy Frequency  2x / week    Duration  --   total of 20 visits   Treatment/Interventions  Functional tasks;SLP instruction and feedback;Compensatory strategies;Patient/family education;Internal/external aids;Cognitive reorganization;Cueing hierarchy;Language facilitation;Environmental controls    Potential to Achieve Goals  Good    Potential Considerations  Severity of impairments   ? diagnosis      Patient will benefit from skilled therapeutic intervention in order to improve the following deficits and impairments:   Cognitive communication deficit  Aphasia    Problem List Patient Active Problem List   Diagnosis Date Noted  . Neurodegenerative dementia, with behavioral disturbance ([place]) 07/02/2019  . Slow transit constipation   . Impulsive   . Sleep disturbance   . Prediabetes   . Bradycardia   . Labile blood pressure   . Seizures ([place])   . Hyponatremia 06/18/2019  . Agitation 06/18/2019  . Encephalopathy 06/18/2019  . 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 Sleep apnea, central 03/26/2019  . Transient ischemic attack (TIA) 01/26/2019  . Transient diplopia 12/20/2018  . Dysesthesia affecting both sides of body 12/20/2018  . 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 Snoring 12/20/2018  . Nocturia more than twice per night 12/20/2018  . Insomnia disorder, with other sleep disorder, recurrent 12/20/2018  . Arthritis   . Cancer ([place])   . Elevated troponin   . Syncope 12/26/2016  . Family history of early CAD 12/26/2016  . Hyperlipidemia 08/15/2011  . PERSONAL HISTORY MALIGNANT NEOPLASM PROSTATE 07/11/2010   SPEECH THERAPY DISCHARGE SUMMARY  Visits from Start of Care: 20  Current functional level related to goals / functional outcomes: See goals above. Patient is able to communicate in simple conversation with use of visual aids and caregiver support. Wife demonstrates appropriate cuing for patient.    Remaining deficits: Moderate-severe cognitive communication deficits including memory, attention, and higher level functions.    Education / Equipment: Activities to support language and cognition at home. Use of compensatory aids and strategies for orientation and simple recall Plan: Patient agrees to discharge.  Patient goals were partially met. Patient is being discharged due to meeting the stated rehab goals.  ?????         Deneise Lever, [place], CCC-SLP Speech-Language Pathologist  Aliene Altes 09/23/2019, 1:49 PM  [facility] [address] [place], [place], [address] Phone: [phone]   Fax:  [phone]   Name: Bee Hammerschmidt, MD MRN: [id number] Date of Birth: June 11, 1941

## 2019-09-23 NOTE — Patient Instructions (Signed)
The website we tried today is called [employer]. You can access it at [email or web address]. Log in: LloydP Password: therapy (all lowercase)  [employer] is another brain activity website. They have a free trial and then a paid subscription.  When you plan Carmel's schedule in the morning, give him some options to choose an activity for downtime.

## 2019-09-27 ENCOUNTER — Encounter: Payer: Self-pay | Admitting: Neurology

## 2019-09-27 DIAGNOSIS — F0789 Other personality and behavioral disorders due to known physiological condition: Secondary | ICD-10-CM

## 2019-09-27 DIAGNOSIS — H532 Diplopia: Secondary | ICD-10-CM

## 2019-09-27 DIAGNOSIS — F0391 Unspecified dementia with behavioral disturbance: Secondary | ICD-10-CM

## 2019-09-27 DIAGNOSIS — G4731 Primary central sleep apnea: Secondary | ICD-10-CM

## 2019-09-27 DIAGNOSIS — F03918 Unspecified dementia, unspecified severity, with other behavioral disturbance: Secondary | ICD-10-CM

## 2019-09-27 DIAGNOSIS — F09 Unspecified mental disorder due to known physiological condition: Secondary | ICD-10-CM

## 2019-09-28 ENCOUNTER — Encounter: Payer: Self-pay | Admitting: Neurology

## 2019-09-29 NOTE — Telephone Encounter (Signed)
   Dear Mrs. Terance Hart, I would like to replace the hose with a softer, more flexible one to help with compliance. If that's not enough and if the PAP machine affects his sleep more negatively than actually helping him, we should consider discontinuing it.   Arta Bruce.

## 2019-10-04 DIAGNOSIS — H532 Diplopia: Secondary | ICD-10-CM | POA: Diagnosis not present

## 2019-10-04 DIAGNOSIS — H3562 Retinal hemorrhage, left eye: Secondary | ICD-10-CM | POA: Diagnosis not present

## 2019-10-04 DIAGNOSIS — H33102 Unspecified retinoschisis, left eye: Secondary | ICD-10-CM | POA: Diagnosis not present

## 2019-10-04 DIAGNOSIS — G43809 Other migraine, not intractable, without status migrainosus: Secondary | ICD-10-CM | POA: Diagnosis not present

## 2019-10-04 DIAGNOSIS — G4731 Primary central sleep apnea: Secondary | ICD-10-CM | POA: Diagnosis not present

## 2019-10-04 DIAGNOSIS — H35372 Puckering of macula, left eye: Secondary | ICD-10-CM | POA: Diagnosis not present

## 2019-10-06 DIAGNOSIS — H532 Diplopia: Secondary | ICD-10-CM | POA: Diagnosis not present

## 2019-10-06 DIAGNOSIS — F0280 Dementia in other diseases classified elsewhere without behavioral disturbance: Secondary | ICD-10-CM | POA: Diagnosis not present

## 2019-10-06 DIAGNOSIS — G309 Alzheimer's disease, unspecified: Secondary | ICD-10-CM | POA: Diagnosis not present

## 2019-10-06 DIAGNOSIS — H524 Presbyopia: Secondary | ICD-10-CM | POA: Diagnosis not present

## 2019-10-06 DIAGNOSIS — H5000 Unspecified esotropia: Secondary | ICD-10-CM | POA: Diagnosis not present

## 2019-10-06 DIAGNOSIS — C61 Malignant neoplasm of prostate: Secondary | ICD-10-CM | POA: Diagnosis not present

## 2019-10-13 DIAGNOSIS — Z23 Encounter for immunization: Secondary | ICD-10-CM | POA: Diagnosis not present

## 2019-10-22 DIAGNOSIS — R4189 Other symptoms and signs involving cognitive functions and awareness: Secondary | ICD-10-CM | POA: Diagnosis not present

## 2019-10-22 DIAGNOSIS — C61 Malignant neoplasm of prostate: Secondary | ICD-10-CM | POA: Diagnosis not present

## 2019-10-22 DIAGNOSIS — K627 Radiation proctitis: Secondary | ICD-10-CM | POA: Diagnosis not present

## 2019-10-22 DIAGNOSIS — H9190 Unspecified hearing loss, unspecified ear: Secondary | ICD-10-CM | POA: Diagnosis not present

## 2019-10-22 DIAGNOSIS — R5383 Other fatigue: Secondary | ICD-10-CM | POA: Diagnosis not present

## 2019-10-22 DIAGNOSIS — R7301 Impaired fasting glucose: Secondary | ICD-10-CM | POA: Diagnosis not present

## 2019-10-22 DIAGNOSIS — F0390 Unspecified dementia without behavioral disturbance: Secondary | ICD-10-CM | POA: Diagnosis not present

## 2019-10-22 DIAGNOSIS — M810 Age-related osteoporosis without current pathological fracture: Secondary | ICD-10-CM | POA: Diagnosis not present

## 2019-10-22 DIAGNOSIS — Z8673 Personal history of transient ischemic attack (TIA), and cerebral infarction without residual deficits: Secondary | ICD-10-CM | POA: Diagnosis not present

## 2019-10-22 DIAGNOSIS — E785 Hyperlipidemia, unspecified: Secondary | ICD-10-CM | POA: Diagnosis not present

## 2019-10-22 DIAGNOSIS — G4733 Obstructive sleep apnea (adult) (pediatric): Secondary | ICD-10-CM | POA: Diagnosis not present

## 2019-11-09 DIAGNOSIS — R41842 Visuospatial deficit: Secondary | ICD-10-CM | POA: Diagnosis not present

## 2019-11-09 DIAGNOSIS — Z79899 Other long term (current) drug therapy: Secondary | ICD-10-CM | POA: Diagnosis not present

## 2019-11-09 DIAGNOSIS — G309 Alzheimer's disease, unspecified: Secondary | ICD-10-CM | POA: Diagnosis not present

## 2019-11-09 DIAGNOSIS — R6889 Other general symptoms and signs: Secondary | ICD-10-CM | POA: Diagnosis not present

## 2019-11-09 DIAGNOSIS — F0280 Dementia in other diseases classified elsewhere without behavioral disturbance: Secondary | ICD-10-CM | POA: Diagnosis not present

## 2019-11-09 DIAGNOSIS — R4189 Other symptoms and signs involving cognitive functions and awareness: Secondary | ICD-10-CM | POA: Diagnosis not present

## 2019-11-09 DIAGNOSIS — G3184 Mild cognitive impairment, so stated: Secondary | ICD-10-CM | POA: Diagnosis not present

## 2019-11-15 DIAGNOSIS — G43809 Other migraine, not intractable, without status migrainosus: Secondary | ICD-10-CM | POA: Diagnosis not present

## 2019-11-15 DIAGNOSIS — H2511 Age-related nuclear cataract, right eye: Secondary | ICD-10-CM | POA: Diagnosis not present

## 2019-11-15 DIAGNOSIS — H35372 Puckering of macula, left eye: Secondary | ICD-10-CM | POA: Diagnosis not present

## 2019-11-23 DIAGNOSIS — G309 Alzheimer's disease, unspecified: Secondary | ICD-10-CM | POA: Diagnosis not present

## 2019-11-23 DIAGNOSIS — R253 Fasciculation: Secondary | ICD-10-CM | POA: Diagnosis not present

## 2019-11-23 DIAGNOSIS — F0280 Dementia in other diseases classified elsewhere without behavioral disturbance: Secondary | ICD-10-CM | POA: Diagnosis not present

## 2019-11-26 DIAGNOSIS — M810 Age-related osteoporosis without current pathological fracture: Secondary | ICD-10-CM | POA: Diagnosis not present

## 2019-12-08 ENCOUNTER — Encounter: Payer: Self-pay | Admitting: Neurology

## 2019-12-08 ENCOUNTER — Ambulatory Visit (INDEPENDENT_AMBULATORY_CARE_PROVIDER_SITE_OTHER): Payer: Medicare Other | Admitting: Neurology

## 2019-12-08 ENCOUNTER — Other Ambulatory Visit: Payer: Self-pay

## 2019-12-08 VITALS — BP 105/69 | HR 62 | Temp 97.9°F | Ht 68.0 in | Wt 144.0 lb

## 2019-12-08 DIAGNOSIS — G4731 Primary central sleep apnea: Secondary | ICD-10-CM | POA: Diagnosis not present

## 2019-12-08 DIAGNOSIS — F0789 Other personality and behavioral disorders due to known physiological condition: Secondary | ICD-10-CM

## 2019-12-08 DIAGNOSIS — G319 Degenerative disease of nervous system, unspecified: Secondary | ICD-10-CM | POA: Diagnosis not present

## 2019-12-08 DIAGNOSIS — F09 Unspecified mental disorder due to known physiological condition: Secondary | ICD-10-CM

## 2019-12-08 NOTE — Patient Instructions (Signed)
Paraneoplastic Syndromes Paraneoplastic syndromes are rare disorders triggered by the body's defense system (immune system) in response to a cancerous tumor. Paraneoplastic syndromes can affect many areas of the body, including the nervous system, hormones, skin, blood, and joints. What are the causes? The exact cause of these syndromes is not known. However, these syndromes are believed to occur when cancerous tumor cells cause an immune response in the body system or area where the cancer is located. The immune system activates white blood cells, also called T-cells, to fight cancer cells. White blood cells normally target cancer cells in the area. Paraneoplastic syndromes may occur when the cancer-fighting cells also target healthy cells, which causes damage to the healthy cells. What increases the risk? These syndromes may develop in any person who has a diagnosis of cancer. The most common cancers associated with these syndromes include:  Small cell lung cancer.  Ovarian cancer.  Testicular cancer.  Breast cancer.  Pancreatic cancer.  Kidney cancer.  Stomach cancer.  Lymphatic system cancer. What are the signs or symptoms? Symptoms generally develop over a period of days to weeks. Symptoms vary depending on the body system involved. The most common symptoms may include fever, weight loss, loss of appetite, and night sweats. Symptoms affecting the nervous system are less common and may include:  Trouble walking, swallowing, and sleeping.  Loss of muscle tone.  Memory loss or problems with thinking.  Dizziness and loss of fine motor skills.  Numbness or tingling of the hands, feet, or other areas of the body.  Slurred speech and vision problems.  Seizures and loss of feeling in the limbs or unusual body movements that cannot be controlled.  Seeing, hearing, tasting, smelling, or feeling things that are not real (hallucinations). Other symptoms may include:  Skin  changes.  Electrolyte imbalance.  Joint pain or stiffness.  Blood clotting problems. In many cases, symptoms of the syndromes occur before the cancerous tumor is found. How is this diagnosed? Diagnosis will include a medical history and a physical exam. You will also have tests done, which will depend on your symptoms and the specific syndrome your health care provider thinks you have. Tests may include:  Blood tests.  Lumbar puncture or spinal tap. This is a procedure in which a small amount of the fluid that surrounds the brain and spinal cord is removed and examined.  MRI.  CT scan.  Positron emission tomography (PET scan).  Electroencephalogram (EEG).  Electromyogram (EMG). How is this treated? There is no cure for these syndromes. Treatment will first focus on treating the cancer you have. Then, treatment will focus on helping your immune system reduce its response to the tumor. This may be done with:  Medicines to stop swelling and inflammation or medicines to slow the immune response.  Anti-seizure medicines.  Blood cleansing (plasmapheresis).  Immunoglobulin antibodies given intravenously (IVIG). These help destroy the cells that are causing harm.  Medicines to improve muscle function. Speech therapy and physical therapy may also be recommended. Follow these instructions at home:  Take over-the-counter and prescription medicines only as told by your health care provider.  Do exercises as told by your health care provider. This includes any speech or physical therapy activities.  Consider joining a support group.  Keep all follow-up visits as told by your health care provider. This is important. Contact a health care provider if:  You have a fever.  You have a reaction to a medicine you are taking.  Your symptoms change or  get worse. Get help right away if:  You have trouble breathing.  You have more trouble swallowing. Summary  Paraneoplastic  syndromes are rare disorders triggered by the body's defense system (immune system) in response to a cancerous tumor.  Paraneoplastic syndromes can affect many areas of the body, including the nervous system, hormones, skin, blood, and joints.  There is no cure for these syndromes.  Treatment will focus on helping your immune system reduce its response to the tumor. This information is not intended to replace advice given to you by your health care provider. Make sure you discuss any questions you have with your health care provider. Document Revised: 01/21/2019 Document Reviewed: 01/21/2019 Elsevier Patient Education  [employer].

## 2019-12-08 NOTE — Progress Notes (Signed)
SLEEP MEDICINE CLINIC    Provider:  Larey Seat, MD  Primary Care Physician:  [name], MD [place] [address]     Referring Provider: [name], [place],  [place] [address]          Chief Complaint according to patient   Patient presents with:    . New Patient (Initial Visit)     Rv after cognitive testing and CSF testing. Had already PET scan, and blood tests. wife discusses there has been some decline but he remains pleasant, she describes gait has worsened, she has noted small tremors that she has noted. EEG was completed at [facility]. He has to be guided in and coached in completing certain things but is able to continue ADL's with her there to help.       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 Patient was this time unable to perform on MMSE- very restricted output- aphasia. Has falls and is no longer fully oriented. No behaviour changes otherwise.  Wife took over the fiances and has not seen any mistake before March 2020. He put together a notebook with all passwords and usernames and was actively involved through the spring. By September 2020 she had to take it all over.   He had an admission to [facility] through [name] and seen Dr Joesph July. EEG was negative for epilepsy and rather mildly affected. He had a background rhythm at 7 hertz.  We discussed the progression in light of only minimal abnormal tests, imaging, etc.  His wife noted fasciculations. Hand twitching, and touching him calms him.  The trembling lasts hours and is noted mostly in bed.  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 on 23 November 2019 and there were no paroxysmal or epileptiform patterns noted and especially no triphasic waves or prion disease related changes.  Diagnosis has been a atypical Alzheimer's dementia with rapid  progression, but if this is truly atypical.  He has been not using his BiPAP at this time it has really not reduced his overall AHI much more.  And he has so many central apneas.  The MMSE today was aborted.  He is not excessively daytime sleepy and I think that his central apneas are a sign of some neurodegenerative process as well.  I mentioned that we may want to do a paraneoplastic work-up and I would like for [name] to initiated I will send him a note today I will - fax this note. Unfortunately I cannot reach him by phone.     08-09-2019, after being seen by [name] at [facility] and [name] @ [phone]. 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 No quick test positive for prion disease, MRI was showing notable atropy in occipital visual cortex,  He has been weaning off Keppra- which made him restless. This was started by cone-hospitalist. He is on Aricept, increased to 10 mg- and Zoloft. He feels better on this combination. He remains off Zetia/ off lupron, and he is sleeping better on BiPAP. He uses hearing aids, he appears meek and passive, his wife directs the conversation. MOCA test last week -  significantly reduced in comparison to June . He had periods of agitation while in hospital.  He uses ASV 26/30 days. Airfit F 20 airfit  FFM.  Reportedly drools- a side effect of cholinergic medication. Has a greatly increased appetite for the last 6 weeks sicne off Lupron. Balance has improved with neuro-rehab and PT/ Speech. He exercises more frequently by having PT. Mild tremor, fine amplitude- in a patient with previous carpal tunnel.    He has continued to have great navigational difficulties and is expected to progress.  He has difficulties with visual memory-  Can remember what he wants but not recall how to get  there (in his mind).   06-08-2019. He has had confusion- more over the last 6 weeks, not related to vivid dreams.  He still j has reading difficulties, visio= spatial problems. He has difficulties imaging/ projecting a route, and some areas look unfamiliar.  Memory for fanacial records has declined since July.  He is more tired than ever, he had several times enuresis.  he has less facial expression. Feels cold a lot, neighbors mentioned a puffy face.   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 The clinical history would also fit that diagnosis.  PET scan did not show atrophy or metabolic decreease in that region of the brain .    Revisit, PCP is Dr Joylene Draft. Chief concern according to patient :  transient confusional episodes.  [name]  reports increasing memory problems, feeling these are related to directional problems, visio spatial- orientation to space, affecting his driving, his reading, his judging distance. . Recall of names and events are affected. He had for the first time ever trouble to file their tax returns. Multitasking is much harder now.  His wife stated he has trouble to recall the folders, the location of the folders and how to find them. 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  The last 6 weeks have been marked by inability to find simple, common objects in the house.  He had gotten lost and is not driving now- but he hadn't been driving alone since May 31 anyway  and not outside residential area. His wife was always with him.   01-31-2019 He had a TIA like event that day.  Central apnea is currently well controlled, also he had some exeptional nights.  Myasthenia negative - no explanation for diplopia.  I strongly suspect a neurodegenerative process. The confusion is not related to central apnea,  but central apnea and confusion are part of the underlying process.      03-29-2019, RV with 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 He  has a  has a past medical history of Allergy, Arthritis, Cancer ([place]), Cataract, Chronic kidney disease, Coronary atherosclerosis, Heart murmur, History of echocardiogram, radiation therapy, Hyperlipidemia, Personal history of malignant neoplasm of prostate, Sinus bradycardia, and Syncope..   I described in my virtual visit that since a trip to [place] in December / January-2020 there was a notable change in personality and cognitive capability that his family and especially his spouse have noted.  The patient describes his recent health history since April 2018 with a printed journal that his wife brought to the visit.  He had an upper  respiratory infection in late April 2018 developed cervical neuralgia and woke out of sleep with this pain seated.  Had a very severe shooting pain up the right side of his head followed by a syncopal episode.  That night the couple went to the [facility] emergency room and he was undergoing cardiac work-up which remained negative.  He was suspected to have had a vasovagal episode may be in relation to pain.   Over the year 2019 there were a few times but an event was mentioned from the prior year to [name] struggle to remember this was only noted and remarkable because he had such an exacting memory.   By late fall 2019 he started to use sometimes the wrong word for example he replaced the word car and truck.  Usually he corrected himself.  He had an onset of  transient double vision, in winter 2019 this began to occur daily and became more obvious but it did not impair his driving or daily activity.  It usually affected him when he was looking at a small object close up or at the person  far away.  The family noted no changes in personality demeanor, his sense of humor, his memory and his ability to function.  The couple went on a trip to [place] in [place] between December 26 and September 12, 2018 and enjoyed the trip very much the journey home however took 30 hours with very little sleep and was therefore very taxing.   The following day, January 12, [name] awoke and noted a visual field change with the left side only.  Localized split image in the left visual field of the left eye was a diagnosis the episode lasted 10 minutes during which she could not read.  He saw [name] who suspected that it may have been a TIA or painless migraine and started [name] on 400 mg magnesium per day.  On January 16s he woke up in the early morning with a 20-minute episode of confusion.  He had some trouble recalling names of friends his thinking was not congruent.  This cleared spontaneously but the couple went to the [facility] emergency room where he underwent a CT scan and MRI and an MRA of the head with a diagnosis of chronic small vessel ischemia and generalized brain atrophy no focal stroke was seen.  On January 17 he saw [name], his primary internist who also thought that it was a TIA versus migraine and prescribed baby aspirin daily.  Somewhere in that time he had an onset of itching at night causing him difficulty sleeping.  He tried antihistamines and tried Aveeno lotion.  The itchiness remained for several months.  By February 2020 the couple left for a vacation of 3 weeks with friends  playing golf, walking, playing bridge -there was a lot of interaction stimulation. During the second evening  on vacation there was a transient moment of confusion while [name] was driving he was not sure which way to turn.  For the residual weeks he showed some flat affect, was a little less talkative and less jovial than normal.  By March 2020 and back in [place] he had more episodes of transient  difficulty with orientation.  This all occurred in well known locations or in route to well-known places. [name] decided to stop his statin medication. He also wondered if magnesium cause itching, and discontinued that supplement.  He continued to be confused on directions while driving before initiating  a car ride.  He had a hard time to admission how to drive to another location.  Because of the call with pandemic and shutdown he had risen very little until now.  He drove 2 months from [facility] to [place] without any difficulties on 13 April.  On 17 April he had our telemedicine appointment, which was meant to address sleep primarily.  The sleep study followed on Jan 18, 2019 and was performed by a home sleep test as the lab remained closed at the time.   He was found to have very severe apnea at an AHI of 49.3/h and a higher RDI indicating the presence of snoring.  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   In the meantime we wanted to see if CPAP might correct his apnea and he was given an auto titration capable CPAP device between 5 and 16 cmH2O pressure with 3 cm expiratory pressure relief and heated humidity.  He continued to have high apnea and hypopnea indices between AHI of 30 and 10/h.  The patient drove to and from [facility] for 3 hours each way without difficulty in late May 2 weekends in a row.  On 31 May he woke up with some confusion and transient numbness of the left hand went to the [facility] ED but had to go along.  EKG was unremarkable he was there for over 8 hours and MRI showed no change in the atrophy or small vessel ischemia from January and this time was obtained with contrast.   By 02-09-2019 he presented to the [facility] neurology and memory clinic and I have a copy of this consult available.     Before we could  open the lab again I switched him to BiPAP with a maximum inspiratory pressure of 14 minimum expiratory pressure of 4 and a pressure support of 5 cm but he remained with a high apnea index, all apneas arising under the therapy for central apneas again with an AHI of 17.3/h.  Also this AHI was lower than the baseline AHI it was not a treatment for the long-term as it was insufficient.  On 25 March 2019 he returned for a titration to positive airway pressure during which it was clearly documented that he failed CPAP but he did much better with BiPAP 12/7 cmH2O with a rescue breath setting at 10/min.  The machine was ordered but was not immediately available to the patient and actually was supposed to be delivered on Monday, 05 April 2019    Family medical Rachelle Hora history: No other family member on CPAP with OSA, dementia , or degenerative disorders.   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  Family status is married , with adult children,and  grandchildren. The patient currently likes to golf, travel , to drive.     He was then asked to return for a titration to positive airway pressure on 25 March 2019 he used an F 30 on a full facemask and be started at 10/5 cm water pressure.  The patient developed some central apneas but the number was actively decreasing to an AHI of only 2.4 at the final setting.  With an ST of 10 so-called rescue breaths the patient at 12/7 cmH2O pressure reached a reduction of the AHI to  0.5/h.  Sleep efficiency was 84% which is very good for titration study, most of the arousals that still occurred during the study were actually related to spontaneous arousal or 2 limb movements.  It was notable that PLM's also continued into REM sleep rapid eye movement sleep.       Review of Systems: Out of a complete 14 system review, the patient complains of only the following symptoms, and all other reviewed systems are negative.:  Snoring,  nocturia related fragmented sleep. Apnea  Aphasia.    How likely are you to doze in the following situations: 0 = not likely, 1 = slight chance, 2 = moderate chance, 3 = high chance   Sitting and Reading? Watching Television? Sitting inactive in a public place (theater or meeting)? As a passenger in a car for an hour without a break? Lying down in the afternoon when circumstances permit? Sitting and talking to someone? Sitting quietly after lunch without alcohol? In a car, while stopped for a few minutes in traffic?   Total = 6/ 24 points   FSS endorsed at 18/ 63 points.     Social History   Socioeconomic History  . Marital status: Married    Spouse name: Not on file  . Number of children: Not on file  . Years of education: Not on file  . Highest education level: Not on file  Occupational History  . Occupation: Retired Engineer, drilling  Tobacco Use  . Smoking status: Never Smoker  . Smokeless tobacco: Never Used  Substance and Sexual Activity  . Alcohol use: Yes    Alcohol/week: 1.0 standard drinks    Types: 1 Cans of beer per week    Comment: occ  . Drug use: No  . Sexual activity: Not on file  Other Topics Concern  . Not on file  Social History Narrative   Lives in [place] with wife.  Exercises regularly - elliptical, play golf.   Social Determinants of Health   Financial Resource Strain:   . Difficulty of Paying Living Expenses:   Food Insecurity:   . Worried About Charity fundraiser in the Last Year:   . Arboriculturist in the Last Year:   Transportation Needs:   . Film/video editor (Medical):   Marland Kitchen Lack of Transportation (Non-Medical):   Physical Activity:   . Days of Exercise per Week:   . Minutes of Exercise per Session:   Stress:   . Feeling of Stress :   Social Connections:   . Frequency of Communication with Friends and Family:   . Frequency of Social Gatherings with Friends and Family:   . Attends Religious Services:   . Active Member of Clubs or  Organizations:   . Attends Archivist Meetings:   Marland Kitchen Marital Status:     Family History  Problem Relation Age of Onset  . Hypertension Mother   . Thyroid disease Mother   . Stroke Father   . Coronary artery disease Father   . Colon cancer Neg Hx   . Esophageal cancer Neg Hx   . Pancreatic cancer Neg Hx   . Rectal cancer Neg Hx   . Stomach cancer Neg Hx     Past Medical History:  Diagnosis Date  . Allergy   . Arthritis   . Cancer ([place])   . Cataract   . Chronic kidney disease    atrophic left kidney  . Coronary atherosclerosis    a. 2012 - incidentally noted  on CT scan of chest;  b. 08/2011 Myoview: normal; c. 03/2015 CPX @ [facility]: normal w/ PAC's, PVC's, and a 3 beat run of NSVT in recovery.  Marland Kitchen Heart murmur    asymptomatic function murmur.  Marland Kitchen History of echocardiogram    a. 12/2004 Echo: Nl EF, trace TR/MR.  Marland Kitchen Hx of radiation therapy   . Hyperlipidemia   . Personal history of malignant neoplasm of prostate    a. 2008 s/p prostatecomtomy-->radiation-->currently on Lupron injections.  . Sinus bradycardia   . Syncope    a. 12/2016    Past Surgical History:  Procedure Laterality Date  . PROSTATECTOMY    . ROBOT ASSISTED LAPAROSCOPIC RADICAL PROSTATECTOMY    . SHOULDER SURGERY Right   . SKIN GRAFT Left   . TONSILLECTOMY       Current Outpatient Medications on File Prior to Visit  Medication Sig Dispense Refill  . aspirin EC 81 MG tablet Take 81 mg by mouth daily.    Marland Kitchen donepezil (ARICEPT) 5 MG tablet Start with 1/2 pill at night for 1 month than a full pill for 1 month the 1.5 pills for one month then switch to one 10 mg pill    . Misc Natural Products (OSTEO BI-FLEX ADV JOINT SHIELD PO) Take 1 tablet by mouth daily.     . Multiple Vitamins-Minerals (CENTRUM SILVER PO) Take 1 tablet by mouth daily.    . sertraline (ZOLOFT) 50 MG tablet START WITH 1/2 PILL PER DAY FOR TWO WEEKS THEN INCREASE TO A FULL PILL     No current facility-administered medications on file  prior to visit.    Allergies  Allergen Reactions  . Atorvastatin Other (See Comments)    Caused confusion    Physical exam:  Today's Vitals   12/08/19 1014  BP: 105/69  Pulse: 62  Temp: 97.9 F (36.6 C)  Weight: 144 lb (65.3 kg)  Height: 5\' 8"  (1.727 m)   Body mass index is 21.9 kg/m.   Wt Readings from Last 3 Encounters:  12/08/19 144 lb (65.3 kg)  08/09/19 152 lb (68.9 kg)  06/18/19 156 lb (70.8 kg)     Ht Readings from Last 3 Encounters:  12/08/19 5\' 8"  (1.727 m)  08/09/19 5\' 9"  (1.753 m)  06/18/19 5\' 9"  (1.753 m)      General: The patient is awake, alert and appears not in acute distress. The patient is well groomed. Head: Normocephalic, atraumatic. Neck is supple. Mallampati 3,  neck circumference:15 inches . Nasal airflow  patent.  Retrognathia is not seen.  Dental status: intact. Cardiovascular:  Regular rate and cardiac rhythm by pulse, without distended neck veins. Respiratory: Lungs are clear to auscultation, no wheezing.  Skin:  Without evidence of ankle edema, or rash. Trunk: The patient's posture is erect.   Neurologic exam : The patient is awake and alert, but no longer oriented to place and time.   Memory subjective described as severely impaired.    Montreal Cognitive Assessment Blind 06/08/2019 03/29/2019  Attention: Read list of digits (0/2) 2 2  Attention: Read list of letters (0/1) 1 1  Attention: Serial 7 subtraction starting at 100 (0/3) 3 3  Language: Repeat phrase (0/2) 2 2  Language : Fluency (0/1) 1 1  Abstraction (0/2) 2 2  Delayed Recall (0/5) 1 3  Orientation (0/6) 6 6  Total 25 20    MMSE 29-30 on 06-08-2019 MMSE - Mini Mental State Exam 12/08/2019  Not completed: Unable to complete  Attention span & concentration ability appeared only slightly impaired  Speech is fluent, without dysarthria, dysphonia or aphasia.  Mood and affect are appropriate.   Cranial nerves: no loss of smell or taste reported  Pupils are equal  and briskly reactive to light. Funduscopic exam deferred. .  Extraocular movements in vertical and horizontal planes were intact and without nystagmus. No Diplopia. Visual fields by finger perimetry are intact. Hearing was intact to soft voice and finger rubbing.  Facial sensation intact to fine touch. Less expression- the patient's facial expression is reduced. Facial motor strength is symmetric and tongue and uvula move midline.  Neck ROM : rotation, tilt and flexion extension were normal for age and shoulder shrug was symmetrical.    Motor exam:  Symmetric bulk,- increased tone in biceps with mild cog-wheeling and a slight restriction in shoulder elevation. Symmetric grip strength- no carpaltunnel signs. There is twitching.  Sensory:  Fine touch, pinprick and vibration were normal.  Proprioception tested in the upper extremities was normal.  Coordination: Rapid alternating movements in the fingers/hands were of normal speed.  The Finger-to-nose maneuver was intact without evidence of ataxia, dysmetria or tremor. Notable was a mild tremor when writing. This was also seen on the Archimedes' spiral drawing and on the image copy part of his [place].   Gait and station: Patient could rise unassisted from a seated position, walked without assistive device. Stance is of normal width/ base and the patient turned with 4 steps, is turns were less steady.  He walks fast with a slight stooped posture and decreased arm swing. No pill rolling tremor. Toe and heel walk were impossible  Deep tendon reflexes: in the upper and lower extremities are symmetric, brisk .Marland Kitchen  Babinski response was deferred .      After spending a total time of 35 minutes face to face and additional time for physical and neurologic examination, review of laboratory studies,  personal review of imaging studies, reports and results of other testing and review of referral information / records as far as provided in visit, I have established  the following assessments:  1) rapid progressive dementia, all very rapidly developing- about 18 month of increasing frequency of transient confusional  Episodes.  We are now discussion posterior cortical atrophy clinical signs, without PET scan to show this being present.  CSF testing TAU positive - and ApoLipo protein positive. We may need a paras neoplastic panel _ I will ask [name].   2) Central sleep apnea and PLMs in REM sleep are most common with  PD or neuro- degenerative disorders.   3) Slight Parkinsonism.- cog-wheeling, slowing, stooped posture, arm swing was seen but was not full.    My Plan is to proceed with:.  I would like to thank [name], MD and [place],  [place] [id number] for allowing me to meet with and to take care of this pleasant patient.   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  He has progressive memory loss, confusion and visio-spatial disorientation. It has crystalized to be most likely cortical posterior atrophy in AD. The trembling and twitching is also new.   I plan to follow up either personally or through our NP within the next 3 months.    CC: I will share my notes with PCP and made a RV in 3 month-    Electronically signed by: Larey Seat, MD 12/08/2019 10:38 AM  [facility] and [employer] certified by [employer] of Sleep Medicine and [place] of the [employer] of Sleep Medicine. Board certified In Neurology through the [place], Fellow of the [employer] of Neurology. Medical Director of [employer].

## 2019-12-10 ENCOUNTER — Telehealth: Payer: Self-pay | Admitting: Neurology

## 2019-12-10 NOTE — Telephone Encounter (Signed)
Discussed the paraneoplastic Ab we mentioned and ordered after last visit. No need to send these, they have been done at [facility]. CD

## 2019-12-13 NOTE — Telephone Encounter (Signed)
Noted  

## 2020-01-03 DIAGNOSIS — R5383 Other fatigue: Secondary | ICD-10-CM | POA: Diagnosis not present

## 2020-01-03 DIAGNOSIS — R410 Disorientation, unspecified: Secondary | ICD-10-CM | POA: Diagnosis not present

## 2020-01-03 DIAGNOSIS — E7849 Other hyperlipidemia: Secondary | ICD-10-CM | POA: Diagnosis not present

## 2020-01-03 DIAGNOSIS — C61 Malignant neoplasm of prostate: Secondary | ICD-10-CM | POA: Diagnosis not present

## 2020-01-09 ENCOUNTER — Encounter: Payer: Self-pay | Admitting: Neurology

## 2020-01-11 ENCOUNTER — Other Ambulatory Visit: Payer: Self-pay | Admitting: Neurology

## 2020-01-11 DIAGNOSIS — G4731 Primary central sleep apnea: Secondary | ICD-10-CM

## 2020-01-17 NOTE — Telephone Encounter (Signed)
He can either return the machine to [facility] or to [place]. We inform [facility] of the change in therapy.

## 2020-01-18 DIAGNOSIS — G309 Alzheimer's disease, unspecified: Secondary | ICD-10-CM | POA: Diagnosis not present

## 2020-01-18 DIAGNOSIS — F0280 Dementia in other diseases classified elsewhere without behavioral disturbance: Secondary | ICD-10-CM | POA: Diagnosis not present

## 2020-01-18 DIAGNOSIS — R2689 Other abnormalities of gait and mobility: Secondary | ICD-10-CM | POA: Diagnosis not present

## 2020-01-18 DIAGNOSIS — G319 Degenerative disease of nervous system, unspecified: Secondary | ICD-10-CM | POA: Diagnosis not present

## 2020-01-18 DIAGNOSIS — G20 Parkinson's disease: Secondary | ICD-10-CM | POA: Diagnosis not present

## 2020-01-19 DIAGNOSIS — D649 Anemia, unspecified: Secondary | ICD-10-CM | POA: Diagnosis not present

## 2020-01-19 DIAGNOSIS — Z961 Presence of intraocular lens: Secondary | ICD-10-CM | POA: Diagnosis not present

## 2020-01-19 DIAGNOSIS — H5310 Unspecified subjective visual disturbances: Secondary | ICD-10-CM | POA: Diagnosis not present

## 2020-01-19 DIAGNOSIS — F0390 Unspecified dementia without behavioral disturbance: Secondary | ICD-10-CM | POA: Diagnosis not present

## 2020-01-19 DIAGNOSIS — I959 Hypotension, unspecified: Secondary | ICD-10-CM | POA: Diagnosis not present

## 2020-01-19 DIAGNOSIS — K922 Gastrointestinal hemorrhage, unspecified: Secondary | ICD-10-CM | POA: Diagnosis not present

## 2020-01-20 ENCOUNTER — Other Ambulatory Visit: Payer: Self-pay | Admitting: Neurology

## 2020-01-20 DIAGNOSIS — D649 Anemia, unspecified: Secondary | ICD-10-CM | POA: Diagnosis not present

## 2020-01-20 DIAGNOSIS — F0390 Unspecified dementia without behavioral disturbance: Secondary | ICD-10-CM | POA: Diagnosis not present

## 2020-01-20 DIAGNOSIS — I959 Hypotension, unspecified: Secondary | ICD-10-CM | POA: Diagnosis not present

## 2020-01-20 DIAGNOSIS — K922 Gastrointestinal hemorrhage, unspecified: Secondary | ICD-10-CM | POA: Diagnosis not present

## 2020-01-21 DIAGNOSIS — F0390 Unspecified dementia without behavioral disturbance: Secondary | ICD-10-CM | POA: Diagnosis not present

## 2020-01-21 DIAGNOSIS — D649 Anemia, unspecified: Secondary | ICD-10-CM | POA: Diagnosis not present

## 2020-01-21 DIAGNOSIS — K922 Gastrointestinal hemorrhage, unspecified: Secondary | ICD-10-CM | POA: Diagnosis not present

## 2020-01-21 DIAGNOSIS — R0902 Hypoxemia: Secondary | ICD-10-CM | POA: Diagnosis not present

## 2020-01-21 DIAGNOSIS — I959 Hypotension, unspecified: Secondary | ICD-10-CM | POA: Diagnosis not present

## 2020-01-26 IMAGING — MR MR HEAD W/O CM
9 of 11 series · 32 of 48 positions shown · non-contrast
Comparison: CT head and CTA head and neck head[REDACTED]. MR head
without and with contrast 01/31/2019.
COMPARISON: CT head and CTA head and neck head[REDACTED]. MR head
without and with contrast 01/31/2019.

Addendum:
CLINICAL DATA: Stroke, follow-up. Status post tPA. Slurred speech.

EXAM:
MRI HEAD WITHOUT CONTRAST
TECHNIQUE: Multiplanar, multiecho pulse sequences of the brain and surrounding
structures were obtained without intravenous contrast.

[Series 2: DWI · axial · 3.0mm · 0.94mm/px · z∈[-165,+10]mm · 8 of 122 slices shown (1 of 2)]
[im 1/122]
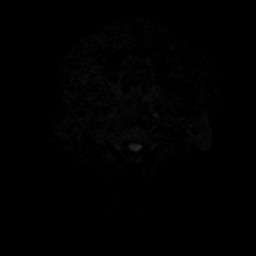
[im 18/122]
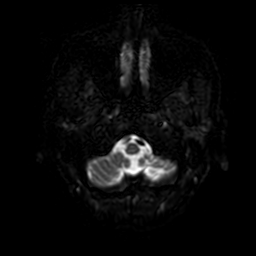
[im 35/122]
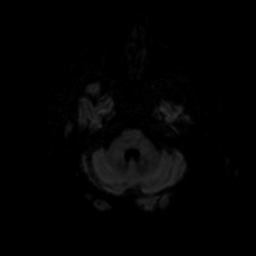
[im 52/122]
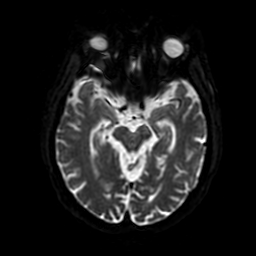
[im 70/122]
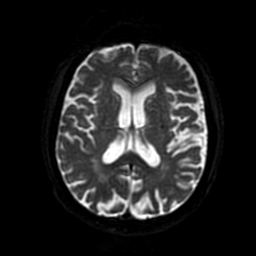
[im 87/122]
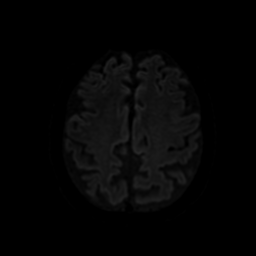
[im 104/122]
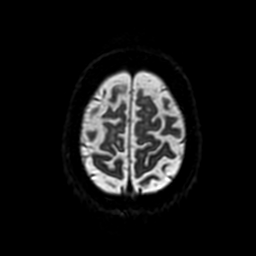
[im 122/122]
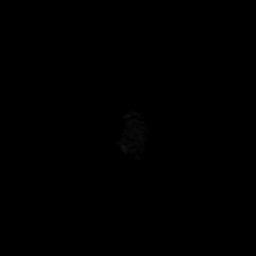

[Series 3: DWI · coronal · 4.0mm · 0.94mm/px · 5 of 78 slices shown (2 of 2)]
[im 1/78]
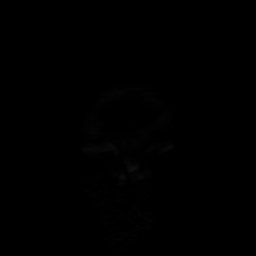
[im 20/78]
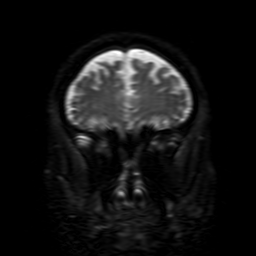
[im 39/78]
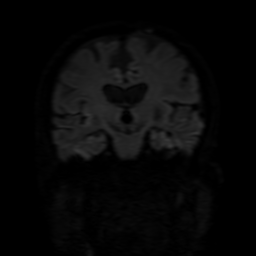
[im 58/78]
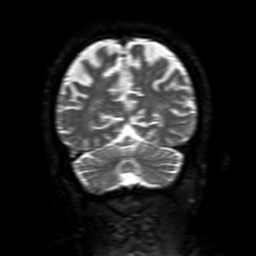
[im 78/78]
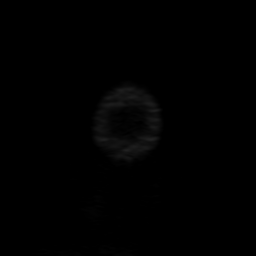

[Series 4: T2 · coronal · 5.0mm · 0.39mm/px · 2 of 28 slices shown (1 of 2)]
[im 1/28]
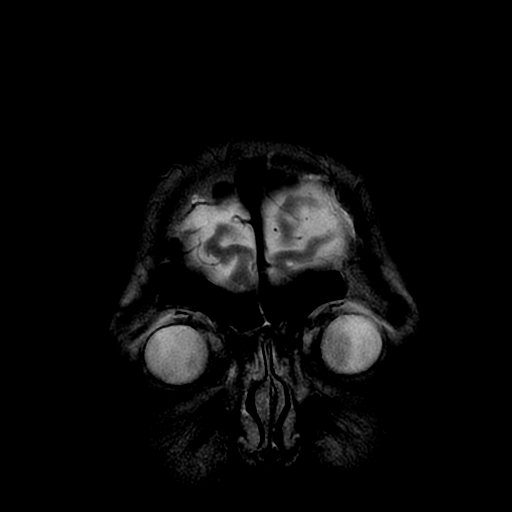
[im 28/28]
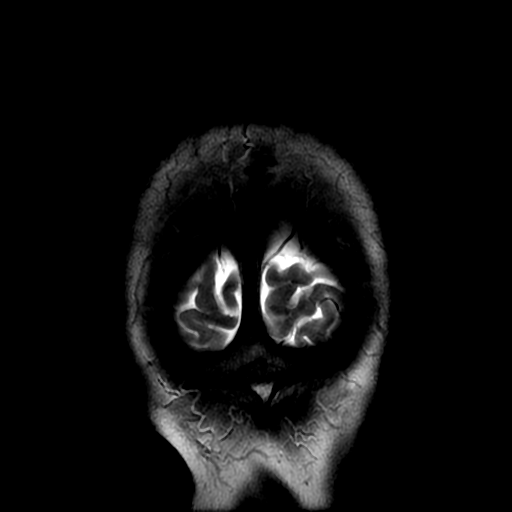

[Series 5: FLAIR · sagittal · 5.0mm · 0.47mm/px · 2 of 23 slices shown (1 of 2)]
[im 1/23]
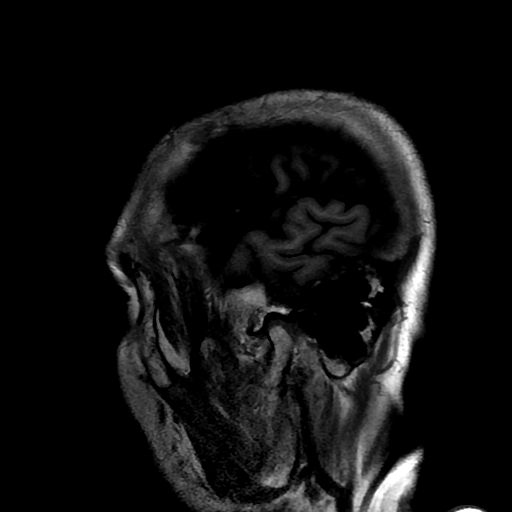
[im 23/23]
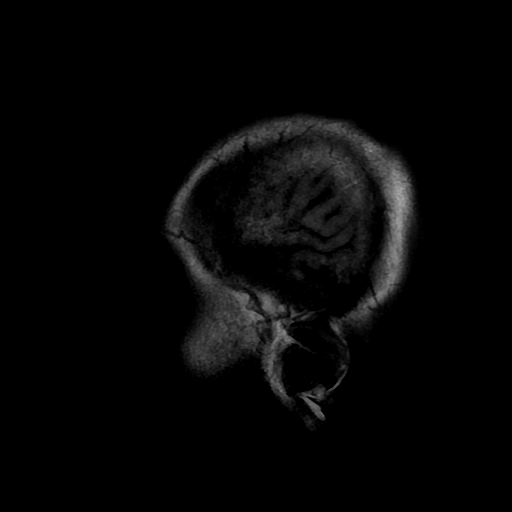

[Series 6: SWI · axial · 3.0mm · 0.47mm/px · z∈[-143,-76]mm · 4 of 108 slices shown]
[im 1/108]
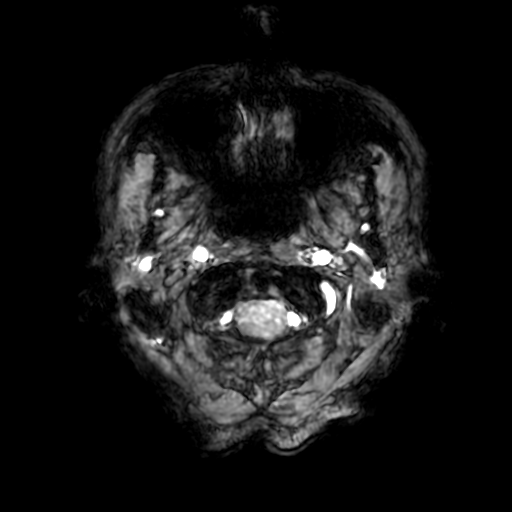
[im 16/108]
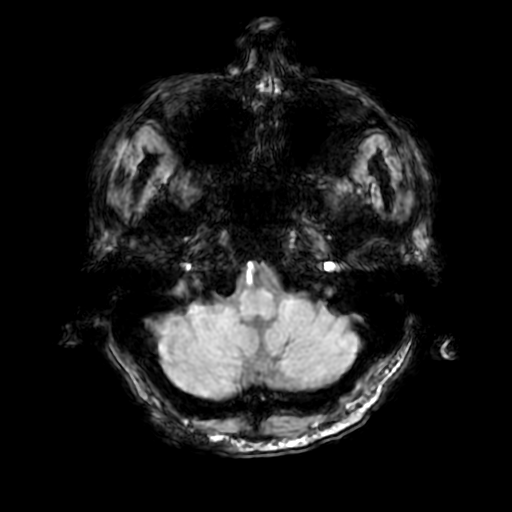
[im 31/108]
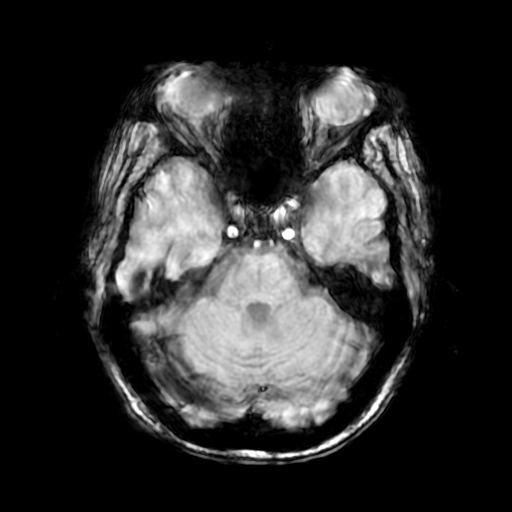
[im 46/108]
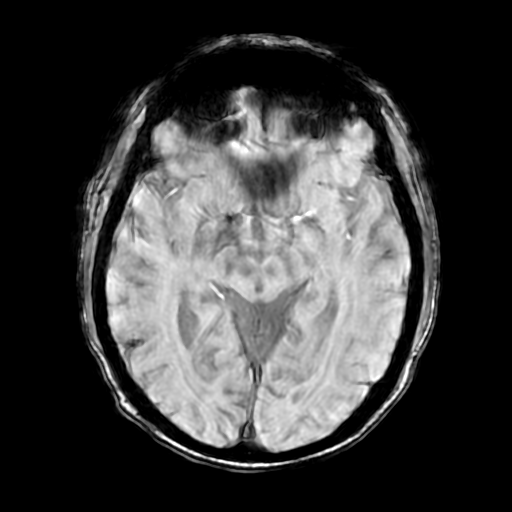

[Series 7: T2 · axial · 5.0mm · 0.47mm/px · z∈[-156,+9]mm · 2 of 29 slices shown (2 of 2)]
[im 1/29]
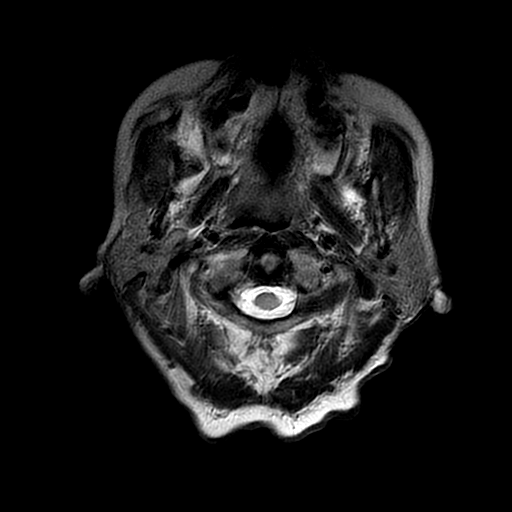
[im 29/29]
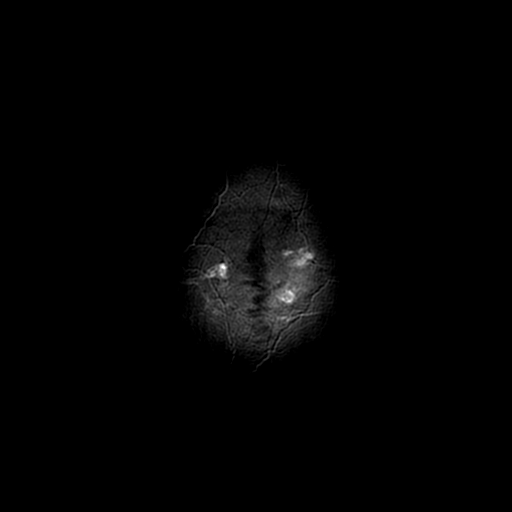

[Series 8: FLAIR · axial · 3.0mm · 0.47mm/px · z∈[-156,+9]mm · 2 of 29 slices shown (2 of 2)]
[im 1/29]
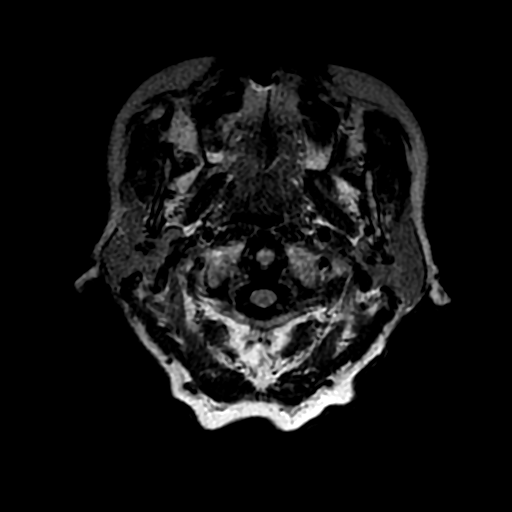
[im 29/29]
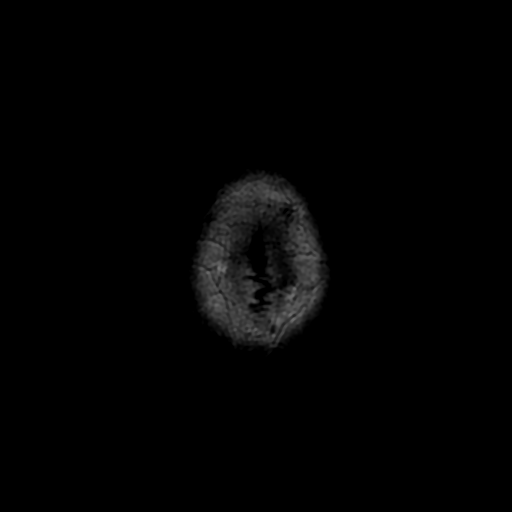

[Series 250: ADC · axial · 3.0mm · 0.94mm/px · z∈[-165,+10]mm · 4 of 61 slices shown (1 of 2)]
[im 1/61]
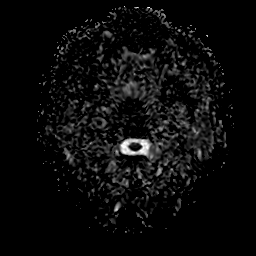
[im 21/61]
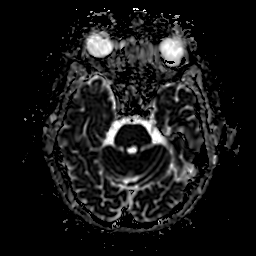
[im 41/61]
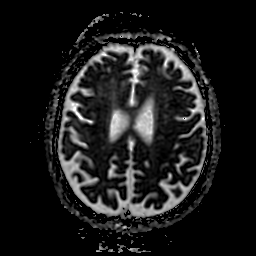
[im 61/61]
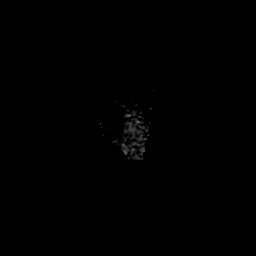

[Series 350: ADC · coronal · 4.0mm · 0.94mm/px · 3 of 39 slices shown (2 of 2)]
[im 1/39]
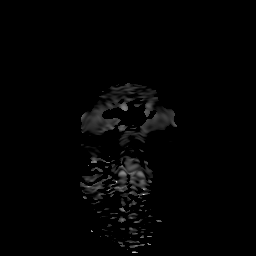
[im 20/39]
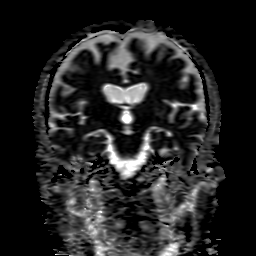
[im 39/39]
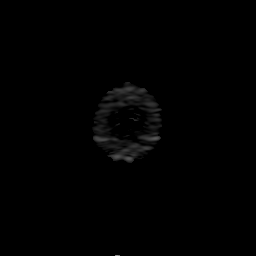

[32 of 48 positions shown; findings below may reference images not displayed]

FINDINGS: Brain: Diffusion-weighted images demonstrate no acute or subacute
infarction. Mild generalized atrophy and white matter disease is
stable. No acute hemorrhage or mass lesion is present. The
ventricles are of normal size. No significant extraaxial fluid
collection is present. Dilated perivascular spaces are present in
the basal ganglia. The brainstem and cerebellum are within normal
limits.

Vascular: Flow is present in the major intracranial arteries.

Skull and upper cervical spine: The craniocervical junction is
normal. Upper cervical spine is within normal limits. Marrow signal
is unremarkable.

Sinuses/Orbits: The paranasal sinuses and mastoid air cells are
clear. Left lens replacement is noted. Globes and orbits are
otherwise within normal limits.
IMPRESSION: 1. No acute intracranial abnormality or significant interval change.
2. Stable atrophy and white matter disease.

ADDENDUM:
A 4 mm subcortical infarct is present in the right left frontal
operculum on image 36 of series 2. Findings were discussed with Dr.
Chukwu at [DATE] p.m.

*** End of Addendum ***
FINDINGS: Brain: Diffusion-weighted images demonstrate no acute or subacute
infarction. Mild generalized atrophy and white matter disease is
stable. No acute hemorrhage or mass lesion is present. The
ventricles are of normal size. No significant extraaxial fluid
collection is present. Dilated perivascular spaces are present in
the basal ganglia. The brainstem and cerebellum are within normal
limits.

Vascular: Flow is present in the major intracranial arteries.

Skull and upper cervical spine: The craniocervical junction is
normal. Upper cervical spine is within normal limits. Marrow signal
is unremarkable.

Sinuses/Orbits: The paranasal sinuses and mastoid air cells are
clear. Left lens replacement is noted. Globes and orbits are
otherwise within normal limits.
IMPRESSION: 1. No acute intracranial abnormality or significant interval change.
2. Stable atrophy and white matter disease.

## 2020-02-11 DIAGNOSIS — R82998 Other abnormal findings in urine: Secondary | ICD-10-CM | POA: Diagnosis not present

## 2020-03-14 DIAGNOSIS — G309 Alzheimer's disease, unspecified: Secondary | ICD-10-CM | POA: Diagnosis not present

## 2020-03-14 DIAGNOSIS — F0280 Dementia in other diseases classified elsewhere without behavioral disturbance: Secondary | ICD-10-CM | POA: Diagnosis not present

## 2020-03-14 DIAGNOSIS — Z79899 Other long term (current) drug therapy: Secondary | ICD-10-CM | POA: Diagnosis not present

## 2020-03-14 DIAGNOSIS — F0390 Unspecified dementia without behavioral disturbance: Secondary | ICD-10-CM | POA: Diagnosis not present

## 2020-03-14 DIAGNOSIS — Z888 Allergy status to other drugs, medicaments and biological substances status: Secondary | ICD-10-CM | POA: Diagnosis not present

## 2020-03-14 DIAGNOSIS — G3189 Other specified degenerative diseases of nervous system: Secondary | ICD-10-CM | POA: Diagnosis not present

## 2020-03-15 ENCOUNTER — Ambulatory Visit: Payer: Medicare Other | Admitting: Neurology

## 2020-03-15 DIAGNOSIS — G253 Myoclonus: Secondary | ICD-10-CM | POA: Diagnosis not present

## 2020-03-15 DIAGNOSIS — Z8673 Personal history of transient ischemic attack (TIA), and cerebral infarction without residual deficits: Secondary | ICD-10-CM | POA: Diagnosis not present

## 2020-03-15 DIAGNOSIS — G4733 Obstructive sleep apnea (adult) (pediatric): Secondary | ICD-10-CM | POA: Diagnosis not present

## 2020-03-15 DIAGNOSIS — F0390 Unspecified dementia without behavioral disturbance: Secondary | ICD-10-CM | POA: Diagnosis not present

## 2020-03-15 DIAGNOSIS — M810 Age-related osteoporosis without current pathological fracture: Secondary | ICD-10-CM | POA: Diagnosis not present

## 2020-03-15 DIAGNOSIS — C61 Malignant neoplasm of prostate: Secondary | ICD-10-CM | POA: Diagnosis not present

## 2020-03-28 ENCOUNTER — Ambulatory Visit: Payer: Medicare Other | Admitting: Neurology

## 2020-03-29 DIAGNOSIS — F0280 Dementia in other diseases classified elsewhere without behavioral disturbance: Secondary | ICD-10-CM | POA: Diagnosis not present

## 2020-03-29 DIAGNOSIS — R278 Other lack of coordination: Secondary | ICD-10-CM | POA: Diagnosis not present

## 2020-03-29 DIAGNOSIS — R293 Abnormal posture: Secondary | ICD-10-CM | POA: Diagnosis not present

## 2020-03-29 DIAGNOSIS — I6389 Other cerebral infarction: Secondary | ICD-10-CM | POA: Diagnosis not present

## 2020-03-29 DIAGNOSIS — Z8673 Personal history of transient ischemic attack (TIA), and cerebral infarction without residual deficits: Secondary | ICD-10-CM | POA: Diagnosis not present

## 2020-03-29 DIAGNOSIS — G253 Myoclonus: Secondary | ICD-10-CM | POA: Diagnosis not present

## 2020-03-29 DIAGNOSIS — R2689 Other abnormalities of gait and mobility: Secondary | ICD-10-CM | POA: Diagnosis not present

## 2020-03-29 DIAGNOSIS — M6389 Disorders of muscle in diseases classified elsewhere, multiple sites: Secondary | ICD-10-CM | POA: Diagnosis not present

## 2020-03-30 DIAGNOSIS — R293 Abnormal posture: Secondary | ICD-10-CM | POA: Diagnosis not present

## 2020-03-30 DIAGNOSIS — R2689 Other abnormalities of gait and mobility: Secondary | ICD-10-CM | POA: Diagnosis not present

## 2020-03-30 DIAGNOSIS — F0280 Dementia in other diseases classified elsewhere without behavioral disturbance: Secondary | ICD-10-CM | POA: Diagnosis not present

## 2020-03-30 DIAGNOSIS — R278 Other lack of coordination: Secondary | ICD-10-CM | POA: Diagnosis not present

## 2020-03-30 DIAGNOSIS — M6389 Disorders of muscle in diseases classified elsewhere, multiple sites: Secondary | ICD-10-CM | POA: Diagnosis not present

## 2020-03-30 DIAGNOSIS — G253 Myoclonus: Secondary | ICD-10-CM | POA: Diagnosis not present

## 2020-03-31 DIAGNOSIS — M6389 Disorders of muscle in diseases classified elsewhere, multiple sites: Secondary | ICD-10-CM | POA: Diagnosis not present

## 2020-03-31 DIAGNOSIS — R293 Abnormal posture: Secondary | ICD-10-CM | POA: Diagnosis not present

## 2020-03-31 DIAGNOSIS — F0280 Dementia in other diseases classified elsewhere without behavioral disturbance: Secondary | ICD-10-CM | POA: Diagnosis not present

## 2020-03-31 DIAGNOSIS — R2689 Other abnormalities of gait and mobility: Secondary | ICD-10-CM | POA: Diagnosis not present

## 2020-03-31 DIAGNOSIS — G253 Myoclonus: Secondary | ICD-10-CM | POA: Diagnosis not present

## 2020-03-31 DIAGNOSIS — R278 Other lack of coordination: Secondary | ICD-10-CM | POA: Diagnosis not present

## 2020-04-04 DIAGNOSIS — I6389 Other cerebral infarction: Secondary | ICD-10-CM | POA: Diagnosis not present

## 2020-04-04 DIAGNOSIS — F0280 Dementia in other diseases classified elsewhere without behavioral disturbance: Secondary | ICD-10-CM | POA: Diagnosis not present

## 2020-04-04 DIAGNOSIS — R278 Other lack of coordination: Secondary | ICD-10-CM | POA: Diagnosis not present

## 2020-04-04 DIAGNOSIS — R2689 Other abnormalities of gait and mobility: Secondary | ICD-10-CM | POA: Diagnosis not present

## 2020-04-04 DIAGNOSIS — Z8673 Personal history of transient ischemic attack (TIA), and cerebral infarction without residual deficits: Secondary | ICD-10-CM | POA: Diagnosis not present

## 2020-04-04 DIAGNOSIS — R293 Abnormal posture: Secondary | ICD-10-CM | POA: Diagnosis not present

## 2020-04-04 DIAGNOSIS — G253 Myoclonus: Secondary | ICD-10-CM | POA: Diagnosis not present

## 2020-04-04 DIAGNOSIS — M6389 Disorders of muscle in diseases classified elsewhere, multiple sites: Secondary | ICD-10-CM | POA: Diagnosis not present

## 2020-04-05 DIAGNOSIS — G253 Myoclonus: Secondary | ICD-10-CM | POA: Diagnosis not present

## 2020-04-05 DIAGNOSIS — M6389 Disorders of muscle in diseases classified elsewhere, multiple sites: Secondary | ICD-10-CM | POA: Diagnosis not present

## 2020-04-05 DIAGNOSIS — R293 Abnormal posture: Secondary | ICD-10-CM | POA: Diagnosis not present

## 2020-04-05 DIAGNOSIS — R2689 Other abnormalities of gait and mobility: Secondary | ICD-10-CM | POA: Diagnosis not present

## 2020-04-05 DIAGNOSIS — R278 Other lack of coordination: Secondary | ICD-10-CM | POA: Diagnosis not present

## 2020-04-05 DIAGNOSIS — F0280 Dementia in other diseases classified elsewhere without behavioral disturbance: Secondary | ICD-10-CM | POA: Diagnosis not present

## 2020-04-06 DIAGNOSIS — R2689 Other abnormalities of gait and mobility: Secondary | ICD-10-CM | POA: Diagnosis not present

## 2020-04-06 DIAGNOSIS — R293 Abnormal posture: Secondary | ICD-10-CM | POA: Diagnosis not present

## 2020-04-06 DIAGNOSIS — F0280 Dementia in other diseases classified elsewhere without behavioral disturbance: Secondary | ICD-10-CM | POA: Diagnosis not present

## 2020-04-06 DIAGNOSIS — R278 Other lack of coordination: Secondary | ICD-10-CM | POA: Diagnosis not present

## 2020-04-06 DIAGNOSIS — M6389 Disorders of muscle in diseases classified elsewhere, multiple sites: Secondary | ICD-10-CM | POA: Diagnosis not present

## 2020-04-06 DIAGNOSIS — G253 Myoclonus: Secondary | ICD-10-CM | POA: Diagnosis not present

## 2020-04-07 DIAGNOSIS — M6389 Disorders of muscle in diseases classified elsewhere, multiple sites: Secondary | ICD-10-CM | POA: Diagnosis not present

## 2020-04-07 DIAGNOSIS — F0280 Dementia in other diseases classified elsewhere without behavioral disturbance: Secondary | ICD-10-CM | POA: Diagnosis not present

## 2020-04-07 DIAGNOSIS — R2689 Other abnormalities of gait and mobility: Secondary | ICD-10-CM | POA: Diagnosis not present

## 2020-04-07 DIAGNOSIS — R278 Other lack of coordination: Secondary | ICD-10-CM | POA: Diagnosis not present

## 2020-04-07 DIAGNOSIS — G253 Myoclonus: Secondary | ICD-10-CM | POA: Diagnosis not present

## 2020-04-07 DIAGNOSIS — R293 Abnormal posture: Secondary | ICD-10-CM | POA: Diagnosis not present

## 2020-04-10 DIAGNOSIS — G253 Myoclonus: Secondary | ICD-10-CM | POA: Diagnosis not present

## 2020-04-10 DIAGNOSIS — M6389 Disorders of muscle in diseases classified elsewhere, multiple sites: Secondary | ICD-10-CM | POA: Diagnosis not present

## 2020-04-10 DIAGNOSIS — F0280 Dementia in other diseases classified elsewhere without behavioral disturbance: Secondary | ICD-10-CM | POA: Diagnosis not present

## 2020-04-10 DIAGNOSIS — R2689 Other abnormalities of gait and mobility: Secondary | ICD-10-CM | POA: Diagnosis not present

## 2020-04-10 DIAGNOSIS — R293 Abnormal posture: Secondary | ICD-10-CM | POA: Diagnosis not present

## 2020-04-10 DIAGNOSIS — R278 Other lack of coordination: Secondary | ICD-10-CM | POA: Diagnosis not present

## 2020-04-11 DIAGNOSIS — R278 Other lack of coordination: Secondary | ICD-10-CM | POA: Diagnosis not present

## 2020-04-11 DIAGNOSIS — M6389 Disorders of muscle in diseases classified elsewhere, multiple sites: Secondary | ICD-10-CM | POA: Diagnosis not present

## 2020-04-11 DIAGNOSIS — F0280 Dementia in other diseases classified elsewhere without behavioral disturbance: Secondary | ICD-10-CM | POA: Diagnosis not present

## 2020-04-11 DIAGNOSIS — G253 Myoclonus: Secondary | ICD-10-CM | POA: Diagnosis not present

## 2020-04-11 DIAGNOSIS — R293 Abnormal posture: Secondary | ICD-10-CM | POA: Diagnosis not present

## 2020-04-11 DIAGNOSIS — R2689 Other abnormalities of gait and mobility: Secondary | ICD-10-CM | POA: Diagnosis not present

## 2020-04-12 DIAGNOSIS — M6389 Disorders of muscle in diseases classified elsewhere, multiple sites: Secondary | ICD-10-CM | POA: Diagnosis not present

## 2020-04-12 DIAGNOSIS — F0280 Dementia in other diseases classified elsewhere without behavioral disturbance: Secondary | ICD-10-CM | POA: Diagnosis not present

## 2020-04-12 DIAGNOSIS — R278 Other lack of coordination: Secondary | ICD-10-CM | POA: Diagnosis not present

## 2020-04-12 DIAGNOSIS — R293 Abnormal posture: Secondary | ICD-10-CM | POA: Diagnosis not present

## 2020-04-12 DIAGNOSIS — G253 Myoclonus: Secondary | ICD-10-CM | POA: Diagnosis not present

## 2020-04-12 DIAGNOSIS — R2689 Other abnormalities of gait and mobility: Secondary | ICD-10-CM | POA: Diagnosis not present

## 2020-04-13 DIAGNOSIS — M6389 Disorders of muscle in diseases classified elsewhere, multiple sites: Secondary | ICD-10-CM | POA: Diagnosis not present

## 2020-04-13 DIAGNOSIS — R293 Abnormal posture: Secondary | ICD-10-CM | POA: Diagnosis not present

## 2020-04-13 DIAGNOSIS — R2689 Other abnormalities of gait and mobility: Secondary | ICD-10-CM | POA: Diagnosis not present

## 2020-04-13 DIAGNOSIS — R278 Other lack of coordination: Secondary | ICD-10-CM | POA: Diagnosis not present

## 2020-04-13 DIAGNOSIS — F0280 Dementia in other diseases classified elsewhere without behavioral disturbance: Secondary | ICD-10-CM | POA: Diagnosis not present

## 2020-04-13 DIAGNOSIS — G253 Myoclonus: Secondary | ICD-10-CM | POA: Diagnosis not present

## 2020-04-14 DIAGNOSIS — G253 Myoclonus: Secondary | ICD-10-CM | POA: Diagnosis not present

## 2020-04-14 DIAGNOSIS — F0280 Dementia in other diseases classified elsewhere without behavioral disturbance: Secondary | ICD-10-CM | POA: Diagnosis not present

## 2020-04-14 DIAGNOSIS — R2689 Other abnormalities of gait and mobility: Secondary | ICD-10-CM | POA: Diagnosis not present

## 2020-04-14 DIAGNOSIS — R293 Abnormal posture: Secondary | ICD-10-CM | POA: Diagnosis not present

## 2020-04-14 DIAGNOSIS — R278 Other lack of coordination: Secondary | ICD-10-CM | POA: Diagnosis not present

## 2020-04-14 DIAGNOSIS — M6389 Disorders of muscle in diseases classified elsewhere, multiple sites: Secondary | ICD-10-CM | POA: Diagnosis not present

## 2020-04-17 DIAGNOSIS — Z20828 Contact with and (suspected) exposure to other viral communicable diseases: Secondary | ICD-10-CM | POA: Diagnosis not present

## 2020-04-17 DIAGNOSIS — R2689 Other abnormalities of gait and mobility: Secondary | ICD-10-CM | POA: Diagnosis not present

## 2020-04-17 DIAGNOSIS — G253 Myoclonus: Secondary | ICD-10-CM | POA: Diagnosis not present

## 2020-04-17 DIAGNOSIS — R293 Abnormal posture: Secondary | ICD-10-CM | POA: Diagnosis not present

## 2020-04-17 DIAGNOSIS — M6389 Disorders of muscle in diseases classified elsewhere, multiple sites: Secondary | ICD-10-CM | POA: Diagnosis not present

## 2020-04-17 DIAGNOSIS — F0280 Dementia in other diseases classified elsewhere without behavioral disturbance: Secondary | ICD-10-CM | POA: Diagnosis not present

## 2020-04-17 DIAGNOSIS — Z9189 Other specified personal risk factors, not elsewhere classified: Secondary | ICD-10-CM | POA: Diagnosis not present

## 2020-04-17 DIAGNOSIS — R278 Other lack of coordination: Secondary | ICD-10-CM | POA: Diagnosis not present

## 2020-04-18 ENCOUNTER — Non-Acute Institutional Stay (SKILLED_NURSING_FACILITY): Payer: Medicare Other | Admitting: Internal Medicine

## 2020-04-18 ENCOUNTER — Encounter: Payer: Self-pay | Admitting: Internal Medicine

## 2020-04-18 DIAGNOSIS — H547 Unspecified visual loss: Secondary | ICD-10-CM | POA: Diagnosis not present

## 2020-04-18 DIAGNOSIS — G253 Myoclonus: Secondary | ICD-10-CM

## 2020-04-18 DIAGNOSIS — G319 Degenerative disease of nervous system, unspecified: Secondary | ICD-10-CM | POA: Diagnosis not present

## 2020-04-18 DIAGNOSIS — F0280 Dementia in other diseases classified elsewhere without behavioral disturbance: Secondary | ICD-10-CM | POA: Diagnosis not present

## 2020-04-18 DIAGNOSIS — M6389 Disorders of muscle in diseases classified elsewhere, multiple sites: Secondary | ICD-10-CM | POA: Diagnosis not present

## 2020-04-18 DIAGNOSIS — G4731 Primary central sleep apnea: Secondary | ICD-10-CM | POA: Diagnosis not present

## 2020-04-18 DIAGNOSIS — R2689 Other abnormalities of gait and mobility: Secondary | ICD-10-CM | POA: Diagnosis not present

## 2020-04-18 DIAGNOSIS — R278 Other lack of coordination: Secondary | ICD-10-CM | POA: Diagnosis not present

## 2020-04-18 DIAGNOSIS — R293 Abnormal posture: Secondary | ICD-10-CM | POA: Diagnosis not present

## 2020-04-18 NOTE — Progress Notes (Signed)
Provider:  Rexene Underwood. Spencer Underwood, D.O., C.M.D. Location:  [facility] Room Number: 107 Place of Service:  SNF (31)  PCP: [name], MD Patient Care Team: [name], MD as PCP - General (Internal Medicine) Virgia Land, MD as Referring Physician  Extended Emergency Contact Information Primary Emergency Contact: Spencer Underwood Address: [place], [place] Phone: [phone] Mobile Phone: [phone] Relation: Spouse Secondary Emergency Contact: Spencer Underwood Phone: [phone] Relation: Daughter  Code Status: DNR, MOST  Goals of Care: Advanced Directive information Advanced Directives 04/18/2020  Does Patient Have a Medical Advance Directive? Yes  Type of Advance Directive Living will;Out of facility DNR (pink MOST or yellow form)  Does patient want to make changes to medical advance directive? No - Patient declined  Copy of [place] in Chart? -  Would patient like information on creating a medical advance directive? -      Chief Complaint  Patient presents with  . Acute Visit    Rigidity spell during care     HPI: Patient is a 79 y.o. male seen today for a consult to help with management.   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  He's had prostate cancer for which he was on lupron until 7/42, a systolic murmur and osteoporosis.  He came to stay for respite [place] [address] and has now moved to SNF.  Staff is having a hard time providing incontinence care due to stiffness and rigidity.  They are unable to get him fully clean in his groin and buttocks due to stiffening of his body.  Dr Joylene Draft has asked if I would consult since he can't see the resident. His wife has given permission for me to see him and requests to be present for the visit.  Pt was seen and examined in presence of his wife. When I  last met [name], he was alert, oriented, ambulatory, actually running and going to the gym.  He'd been on the health care committee here.  He has taken a marked decline over the 2021 especially.  His wife tells me about his cognitive issues primarily visual at first beginning in 2020.  He had difficulty finding his way around their home.  He started to have short and long-term memory difficulties.  Initially, he still drove himself to the gym, appts in [place] at [facility], etc.   July of 2020, he stopped driving.    His wife provided a document to staff about his changes:  He has a disconnect b/w his brain and what he sees.  His processing is slow.  He had been ambulating very unsteadily at short distances when he first came, but he had several falls and staff really did not feel like it was safe to continue this.    His wife tells me that he used to get dressed sitting in a chair in his closet so it's been a tough transition for him to dress and undress in the bed.  She thinks he is getting startled by the staff though some do have a very gentle and patient approach.  He is adjusting some.    He used to be bipap for central sleep apnea also but could not tolerate since 3/21.    He has lost 25 lbs total since that began.  He does eat well.  He  has no dysphagia.  He just eats very slowly.  Sometimes, he is able to feed himself.  He oscillates a lot over the course of a day in his level of alertness.  He has episodes of twitching of his body/spasms--it was happening more on his right side and his head would drop to his shoulder during the visit.  He's apparently had 2 EEGs and seizures have been ruled out.    Past Medical History:  Diagnosis Date  . Allergy   . Arthritis   . Cancer ([place])   . Cataract   . Chronic kidney disease    atrophic left kidney  . Coronary atherosclerosis    a. 2012 - incidentally noted on CT scan of chest;  b. 08/2011 Myoview: normal; c. 03/2015 CPX @ [facility]: normal w/ PAC's,  PVC's, and a 3 beat run of NSVT in recovery.  Marland Kitchen Heart murmur    asymptomatic function murmur.  Marland Kitchen History of echocardiogram    a. 12/2004 Echo: Nl EF, trace TR/MR.  Marland Kitchen Hx of radiation therapy   . Hyperlipidemia   . Personal history of malignant neoplasm of prostate    a. 2008 s/p prostatecomtomy-->radiation-->currently on Lupron injections.  . Sinus bradycardia   . Syncope    a. 12/2016   Past Surgical History:  Procedure Laterality Date  . PROSTATECTOMY    . ROBOT ASSISTED LAPAROSCOPIC RADICAL PROSTATECTOMY    . SHOULDER SURGERY Right   . SKIN GRAFT Left   . TONSILLECTOMY      Social History   Socioeconomic History  . Marital status: Married    Spouse name: Not on file  . Number of children: Not on file  . Years of education: Not on file  . Highest education level: Not on file  Occupational History  . Occupation: Retired Engineer, drilling  Tobacco Use  . Smoking status: Never Smoker  . Smokeless tobacco: Never Used  Substance and Sexual Activity  . Alcohol use: Yes    Alcohol/week: 1.0 standard drink    Types: 1 Cans of beer per week    Comment: occ  . Drug use: No  . Sexual activity: Not on file  Other Topics Concern  . Not on file  Social History Narrative   Lives in [place] with wife.  Exercises regularly - elliptical, play golf.   Social Determinants of Health   Financial Resource Strain:   . Difficulty of Paying Living Expenses:   Food Insecurity:   . Worried About Charity fundraiser in the Last Year:   . Arboriculturist in the Last Year:   Transportation Needs:   . Film/video editor (Medical):   Marland Kitchen Lack of Transportation (Non-Medical):   Physical Activity:   . Days of Exercise per Week:   . Minutes of Exercise per Session:   Stress:   . Feeling of Stress :   Social Connections:   . Frequency of Communication with Friends and Family:   . Frequency of Social Gatherings with Friends and Family:   . Attends Religious Services:   . Active Member of Clubs or  Organizations:   . Attends Archivist Meetings:   Marland Kitchen Marital Status:     reports that he has never smoked. He has never used smokeless tobacco. He reports current alcohol use of about 1.0 standard drink of alcohol per week. He reports that he does not use drugs.  Functional Status Survey:    Family History  Problem Relation Age of Onset  .  Hypertension Mother   . Thyroid disease Mother   . Stroke Father   . Coronary artery disease Father   . Colon cancer Neg Hx   . Esophageal cancer Neg Hx   . Pancreatic cancer Neg Hx   . Rectal cancer Neg Hx   . Stomach cancer Neg Hx     Health Maintenance  Topic Date Due  . Hepatitis C Screening  Never done  . COVID-19 Vaccine (1) Never done  . TETANUS/TDAP  Never done  . PNA vac Low Risk Adult (2 of 2 - PPSV23) 11/09/2013  . INFLUENZA VACCINE  04/02/2020    Allergies  Allergen Reactions  . Atorvastatin Other (See Comments)    Caused confusion    Outpatient Encounter Medications as of 04/18/2020  Medication Sig  . donepezil (ARICEPT) 10 MG tablet Take 10 mg by mouth at bedtime.  . melatonin 3 MG TABS tablet Take 3 mg by mouth at bedtime.  . [DISCONTINUED] aspirin EC 81 MG tablet Take 81 mg by mouth daily.  . [DISCONTINUED] donepezil (ARICEPT) 5 MG tablet Start with 1/2 pill at night for 1 month than a full pill for 1 month the 1.5 pills for one month then switch to one 10 mg pill  . [DISCONTINUED] Misc Natural Products (OSTEO BI-FLEX ADV JOINT SHIELD PO) Take 1 tablet by mouth daily.   . [DISCONTINUED] Multiple Vitamins-Minerals (CENTRUM SILVER PO) Take 1 tablet by mouth daily.  . [DISCONTINUED] pantoprazole (PROTONIX) 40 MG tablet Take 40 mg by mouth.  . [DISCONTINUED] sertraline (ZOLOFT) 50 MG tablet START WITH 1/2 PILL PER DAY FOR TWO WEEKS THEN INCREASE TO A FULL PILL   No facility-administered encounter medications on file as of 04/18/2020.    Review of Systems  Constitutional: Positive for weight loss. Negative for  chills and fever.  HENT: Negative for congestion and sore throat.   Eyes:       Visual challenges related to his dementia  Respiratory: Negative for cough and shortness of breath.   Cardiovascular: Negative for chest pain and leg swelling.  Gastrointestinal: Negative for abdominal pain and constipation.  Genitourinary: Negative for dysuria.  Musculoskeletal: Positive for falls.  Skin: Negative for rash.  Neurological: Positive for tremors and weakness. Negative for dizziness and loss of consciousness.  Psychiatric/Behavioral: Positive for memory loss. Negative for depression. The patient is not nervous/anxious and does not have insomnia.     Vitals:   04/18/20 1104  BP: (!) 106/58  Pulse: 70  Temp: (!) 97.5 F (36.4 C)  SpO2: 95%  Weight: 130 lb 3.2 oz (59.1 kg)  Height: _0  (1.727 m)   Body mass index is 19.8 kg/m. Physical Exam Vitals reviewed.  Constitutional:      General: He is not in acute distress.    Appearance: He is not toxic-appearing.     Comments: Alertness waxed and waned--would open his eyes widely, seem engaged, then a moment later his head would drop and he'd appear to sleep, thin, frail appearing  HENT:     Head: Normocephalic and atraumatic.     Right Ear: External ear normal.     Left Ear: External ear normal.     Nose: Nose normal.     Mouth/Throat:     Pharynx: Oropharynx is clear.  Eyes:     Conjunctiva/sclera: Conjunctivae normal.     Comments: glasses  Cardiovascular:     Rate and Rhythm: Normal rate and regular rhythm.  Pulmonary:     Effort: Pulmonary  effort is normal.     Breath sounds: Normal breath sounds.  Abdominal:     General: Bowel sounds are normal.     Palpations: Abdomen is soft.     Tenderness: There is no abdominal tenderness.  Musculoskeletal:     Cervical back: Neck supple.     Right lower leg: No edema.     Left lower leg: No edema.     Comments: Unable to follow commands  Skin:    General: Skin is warm and dry.    Neurological:     Motor: Weakness present.     Coordination: Coordination abnormal.     Gait: Gait abnormal.     Comments: Minimal speech is quite garbled; myoclonic jerking on right greater than left side, some rigidity; loss of muscle mass; using manual wheelchair and hoyer lift now  Psychiatric:     Comments: Could not really assess     Labs reviewed: Basic Metabolic Panel: Recent Labs    06/15/19 1646 06/15/19 1646 06/15/19 1659 06/17/19 0357 06/21/19 0751  NA 130*   < > 129* 130* 134*  K 4.3   < > 4.3 3.8 4.4  CL 95*   < > 94* 96* 100  CO2 25  --   --  21* 23  GLUCOSE 125*   < > 120* 104* 94  BUN 15   < > _0 CREATININE 1.02   < > 1.00 0.78 1.05  CALCIUM 9.1  --   --  9.2 9.5   < > = values in this interval not displayed.   Liver Function Tests: Recent Labs    06/15/19 1646 06/21/19 0751  AST 26 20  ALT 17 15  ALKPHOS 44 46  BILITOT 0.9 0.7  PROT 6.6 6.5  ALBUMIN 4.2 3.8   No results for input(s): LIPASE, AMYLASE in the last 8760 hours. No results for input(s): AMMONIA in the last 8760 hours. CBC: Recent Labs    06/15/19 1646 06/15/19 1646 06/15/19 1659 06/17/19 0357 06/21/19 0751  WBC 5.1  --   --  7.4 5.3  NEUTROABS 3.2  --   --   --  3.2  HGB 13.6   < > 13.9 14.1 15.0  HCT 40.3   < > 41.0 39.7 44.3  MCV 90.8  --   --  88.0 90.2  PLT 254  --   --  215 266   < > = values in this interval not displayed.   Cardiac Enzymes: No results for input(s): CKTOTAL, CKMB, CKMBINDEX, TROPONINI in the last 8760 hours. BNP: Invalid input(s): POCBNP Lab Results  Component Value Date   HGBA1C 5.8 (H) 06/16/2019   Lab Results  Component Value Date   TSH 1.57 07/11/2010   Lab Results  Component Value Date   VITAMINB12 671 07/11/2010   Lab Results  Component Value Date   FOLATE 16.7 07/11/2010   Lab Results  Component Value Date   IRON 89 07/11/2010   FERRITIN 69.5 07/11/2010    Imaging and Procedures obtained prior to SNF admission: EEG  LTVM - Continuous Bedside W/ Video Includes Portable EEG Read  Result Date: 06/18/2019 Lora Havens, MD     06/18/2019  1:34 PM Patient Name: Spencer Cree, MD MRN: [id number] Epilepsy Attending: Lora Havens Referring Physician/Provider: Ferne Coe, NP Duration:06/17/2019 [phone] 06/18/2019 1014  Patient history:79 year old male presented with sudden onset confusion and speech difficulty concerning for stroke. EEG to evaluate for seizures  Level  of alertness:Awake, asleep  AEDs during EEG study: None  Technical aspects: This EEG study was done with scalp electrodes positioned according to the 10-20 International system of electrode placement. Electrical activity was acquired at a sampling rate of _0  and reviewed with a high frequency filter of _1  and a low frequency filter of _2 . EEG data were recorded continuously and digitally stored.  Description: The posterior dominant rhythm consists of 8 Hz activity of moderate voltage (25-35 uV) seen predominantly in posterior head regions, symmetric and reactive to eye opening and eye closing.  EEG also showed intermittent 2-_3  generalized theta-delta slowing, maximal left frontotemporal region region. Hyperventilation and photic stimulation were not performed. Of note there was significant artifact after 06/18/2019 100 AM which significantly limited the review of EEG. Abnormality -Intermittent slow, generalized, maximum left frontotemporal  IMPRESSION: This study is issuggestive of cortical dysfunction in the left frontotemporal region. Additionally, there is evidence of mild diffuse encephalopathy. No seizures or epileptiform discharges were seen throughout the recording. This EEG appears improved compared to prior study on 06/16/2019 as the continuous left frontotemporal slowing has reduced is now replaced by intermittent slowing. Even though no clear epileptiform discharges were seen, given clinical history its possible that patient  had a seizure and the first EEG on 06/16/2019 showed slowing likely secondary to postictal state which has since been improving.    Assessment/Plan 1. 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 Yu Peggs, D.O. [facility] [address],  [address] Cell Phone (Mon-Fri 8am-5pm):  [phone] On Call:  [phone] & follow prompts after 5pm & weekends Office Phone:  [phone] Office Fax:  [phone]

## 2020-04-19 ENCOUNTER — Telehealth: Payer: Self-pay | Admitting: Neurology

## 2020-04-19 DIAGNOSIS — G253 Myoclonus: Secondary | ICD-10-CM | POA: Diagnosis not present

## 2020-04-19 DIAGNOSIS — R2689 Other abnormalities of gait and mobility: Secondary | ICD-10-CM | POA: Diagnosis not present

## 2020-04-19 DIAGNOSIS — F0280 Dementia in other diseases classified elsewhere without behavioral disturbance: Secondary | ICD-10-CM | POA: Diagnosis not present

## 2020-04-19 DIAGNOSIS — M6389 Disorders of muscle in diseases classified elsewhere, multiple sites: Secondary | ICD-10-CM | POA: Diagnosis not present

## 2020-04-19 DIAGNOSIS — R278 Other lack of coordination: Secondary | ICD-10-CM | POA: Diagnosis not present

## 2020-04-19 DIAGNOSIS — R293 Abnormal posture: Secondary | ICD-10-CM | POA: Diagnosis not present

## 2020-04-19 NOTE — Telephone Encounter (Signed)
Patient's wife, Opal Sidles, would like a call back regarding upcoming apt next Thursday as the pt is now in skilled nursing at [facility]. Best call back is [phone]

## 2020-04-20 DIAGNOSIS — F0280 Dementia in other diseases classified elsewhere without behavioral disturbance: Secondary | ICD-10-CM | POA: Diagnosis not present

## 2020-04-20 DIAGNOSIS — R2689 Other abnormalities of gait and mobility: Secondary | ICD-10-CM | POA: Diagnosis not present

## 2020-04-20 DIAGNOSIS — R293 Abnormal posture: Secondary | ICD-10-CM | POA: Diagnosis not present

## 2020-04-20 DIAGNOSIS — G253 Myoclonus: Secondary | ICD-10-CM | POA: Diagnosis not present

## 2020-04-20 DIAGNOSIS — M6389 Disorders of muscle in diseases classified elsewhere, multiple sites: Secondary | ICD-10-CM | POA: Diagnosis not present

## 2020-04-20 DIAGNOSIS — R278 Other lack of coordination: Secondary | ICD-10-CM | POA: Diagnosis not present

## 2020-04-20 NOTE — Telephone Encounter (Signed)
Called the wife back and advised the the apt is not needed. We cancelled the apt that was scheduled. Advised that since we are not prescribing any medications the patient can follow up as needed from our standpoint. She was very appreciative for all the care Dr Dohmeier has provided for the patient.

## 2020-04-21 DIAGNOSIS — F0280 Dementia in other diseases classified elsewhere without behavioral disturbance: Secondary | ICD-10-CM | POA: Diagnosis not present

## 2020-04-21 DIAGNOSIS — R278 Other lack of coordination: Secondary | ICD-10-CM | POA: Diagnosis not present

## 2020-04-21 DIAGNOSIS — R293 Abnormal posture: Secondary | ICD-10-CM | POA: Diagnosis not present

## 2020-04-21 DIAGNOSIS — G253 Myoclonus: Secondary | ICD-10-CM | POA: Diagnosis not present

## 2020-04-21 DIAGNOSIS — R2689 Other abnormalities of gait and mobility: Secondary | ICD-10-CM | POA: Diagnosis not present

## 2020-04-21 DIAGNOSIS — M6389 Disorders of muscle in diseases classified elsewhere, multiple sites: Secondary | ICD-10-CM | POA: Diagnosis not present

## 2020-04-24 DIAGNOSIS — R293 Abnormal posture: Secondary | ICD-10-CM | POA: Diagnosis not present

## 2020-04-24 DIAGNOSIS — R278 Other lack of coordination: Secondary | ICD-10-CM | POA: Diagnosis not present

## 2020-04-24 DIAGNOSIS — Z20828 Contact with and (suspected) exposure to other viral communicable diseases: Secondary | ICD-10-CM | POA: Diagnosis not present

## 2020-04-24 DIAGNOSIS — M6389 Disorders of muscle in diseases classified elsewhere, multiple sites: Secondary | ICD-10-CM | POA: Diagnosis not present

## 2020-04-24 DIAGNOSIS — R2689 Other abnormalities of gait and mobility: Secondary | ICD-10-CM | POA: Diagnosis not present

## 2020-04-24 DIAGNOSIS — G253 Myoclonus: Secondary | ICD-10-CM | POA: Diagnosis not present

## 2020-04-24 DIAGNOSIS — Z9189 Other specified personal risk factors, not elsewhere classified: Secondary | ICD-10-CM | POA: Diagnosis not present

## 2020-04-24 DIAGNOSIS — F0280 Dementia in other diseases classified elsewhere without behavioral disturbance: Secondary | ICD-10-CM | POA: Diagnosis not present

## 2020-04-25 DIAGNOSIS — F0280 Dementia in other diseases classified elsewhere without behavioral disturbance: Secondary | ICD-10-CM | POA: Diagnosis not present

## 2020-04-25 DIAGNOSIS — M6389 Disorders of muscle in diseases classified elsewhere, multiple sites: Secondary | ICD-10-CM | POA: Diagnosis not present

## 2020-04-25 DIAGNOSIS — R293 Abnormal posture: Secondary | ICD-10-CM | POA: Diagnosis not present

## 2020-04-25 DIAGNOSIS — G253 Myoclonus: Secondary | ICD-10-CM | POA: Diagnosis not present

## 2020-04-25 DIAGNOSIS — R278 Other lack of coordination: Secondary | ICD-10-CM | POA: Diagnosis not present

## 2020-04-25 DIAGNOSIS — R2689 Other abnormalities of gait and mobility: Secondary | ICD-10-CM | POA: Diagnosis not present

## 2020-04-26 DIAGNOSIS — F0280 Dementia in other diseases classified elsewhere without behavioral disturbance: Secondary | ICD-10-CM | POA: Diagnosis not present

## 2020-04-26 DIAGNOSIS — M6389 Disorders of muscle in diseases classified elsewhere, multiple sites: Secondary | ICD-10-CM | POA: Diagnosis not present

## 2020-04-26 DIAGNOSIS — G253 Myoclonus: Secondary | ICD-10-CM | POA: Diagnosis not present

## 2020-04-26 DIAGNOSIS — R293 Abnormal posture: Secondary | ICD-10-CM | POA: Diagnosis not present

## 2020-04-26 DIAGNOSIS — R278 Other lack of coordination: Secondary | ICD-10-CM | POA: Diagnosis not present

## 2020-04-26 DIAGNOSIS — R2689 Other abnormalities of gait and mobility: Secondary | ICD-10-CM | POA: Diagnosis not present

## 2020-04-27 ENCOUNTER — Ambulatory Visit: Payer: Medicare Other | Admitting: Neurology

## 2020-04-27 DIAGNOSIS — M6389 Disorders of muscle in diseases classified elsewhere, multiple sites: Secondary | ICD-10-CM | POA: Diagnosis not present

## 2020-04-27 DIAGNOSIS — F0280 Dementia in other diseases classified elsewhere without behavioral disturbance: Secondary | ICD-10-CM | POA: Diagnosis not present

## 2020-04-27 DIAGNOSIS — R293 Abnormal posture: Secondary | ICD-10-CM | POA: Diagnosis not present

## 2020-04-27 DIAGNOSIS — R278 Other lack of coordination: Secondary | ICD-10-CM | POA: Diagnosis not present

## 2020-04-27 DIAGNOSIS — G253 Myoclonus: Secondary | ICD-10-CM | POA: Diagnosis not present

## 2020-04-27 DIAGNOSIS — R2689 Other abnormalities of gait and mobility: Secondary | ICD-10-CM | POA: Diagnosis not present

## 2020-04-28 DIAGNOSIS — M6389 Disorders of muscle in diseases classified elsewhere, multiple sites: Secondary | ICD-10-CM | POA: Diagnosis not present

## 2020-04-28 DIAGNOSIS — G253 Myoclonus: Secondary | ICD-10-CM | POA: Diagnosis not present

## 2020-04-28 DIAGNOSIS — F0280 Dementia in other diseases classified elsewhere without behavioral disturbance: Secondary | ICD-10-CM | POA: Diagnosis not present

## 2020-04-28 DIAGNOSIS — R293 Abnormal posture: Secondary | ICD-10-CM | POA: Diagnosis not present

## 2020-04-28 DIAGNOSIS — R278 Other lack of coordination: Secondary | ICD-10-CM | POA: Diagnosis not present

## 2020-04-28 DIAGNOSIS — R2689 Other abnormalities of gait and mobility: Secondary | ICD-10-CM | POA: Diagnosis not present

## 2020-05-01 DIAGNOSIS — R2689 Other abnormalities of gait and mobility: Secondary | ICD-10-CM | POA: Diagnosis not present

## 2020-05-01 DIAGNOSIS — F0280 Dementia in other diseases classified elsewhere without behavioral disturbance: Secondary | ICD-10-CM | POA: Diagnosis not present

## 2020-05-01 DIAGNOSIS — R293 Abnormal posture: Secondary | ICD-10-CM | POA: Diagnosis not present

## 2020-05-01 DIAGNOSIS — R278 Other lack of coordination: Secondary | ICD-10-CM | POA: Diagnosis not present

## 2020-05-01 DIAGNOSIS — G253 Myoclonus: Secondary | ICD-10-CM | POA: Diagnosis not present

## 2020-05-01 DIAGNOSIS — M6389 Disorders of muscle in diseases classified elsewhere, multiple sites: Secondary | ICD-10-CM | POA: Diagnosis not present

## 2020-05-02 DIAGNOSIS — R293 Abnormal posture: Secondary | ICD-10-CM | POA: Diagnosis not present

## 2020-05-02 DIAGNOSIS — F0280 Dementia in other diseases classified elsewhere without behavioral disturbance: Secondary | ICD-10-CM | POA: Diagnosis not present

## 2020-05-02 DIAGNOSIS — R278 Other lack of coordination: Secondary | ICD-10-CM | POA: Diagnosis not present

## 2020-05-02 DIAGNOSIS — R2689 Other abnormalities of gait and mobility: Secondary | ICD-10-CM | POA: Diagnosis not present

## 2020-05-02 DIAGNOSIS — G253 Myoclonus: Secondary | ICD-10-CM | POA: Diagnosis not present

## 2020-05-02 DIAGNOSIS — M6389 Disorders of muscle in diseases classified elsewhere, multiple sites: Secondary | ICD-10-CM | POA: Diagnosis not present

## 2020-05-03 DIAGNOSIS — M6389 Disorders of muscle in diseases classified elsewhere, multiple sites: Secondary | ICD-10-CM | POA: Diagnosis not present

## 2020-05-03 DIAGNOSIS — R278 Other lack of coordination: Secondary | ICD-10-CM | POA: Diagnosis not present

## 2020-05-03 DIAGNOSIS — F0280 Dementia in other diseases classified elsewhere without behavioral disturbance: Secondary | ICD-10-CM | POA: Diagnosis not present

## 2020-05-03 DIAGNOSIS — Z8673 Personal history of transient ischemic attack (TIA), and cerebral infarction without residual deficits: Secondary | ICD-10-CM | POA: Diagnosis not present

## 2020-05-03 DIAGNOSIS — R293 Abnormal posture: Secondary | ICD-10-CM | POA: Diagnosis not present

## 2020-05-03 DIAGNOSIS — G253 Myoclonus: Secondary | ICD-10-CM | POA: Diagnosis not present

## 2020-05-03 DIAGNOSIS — I6389 Other cerebral infarction: Secondary | ICD-10-CM | POA: Diagnosis not present

## 2020-05-04 DIAGNOSIS — R293 Abnormal posture: Secondary | ICD-10-CM | POA: Diagnosis not present

## 2020-05-04 DIAGNOSIS — Z8673 Personal history of transient ischemic attack (TIA), and cerebral infarction without residual deficits: Secondary | ICD-10-CM | POA: Diagnosis not present

## 2020-05-04 DIAGNOSIS — F0280 Dementia in other diseases classified elsewhere without behavioral disturbance: Secondary | ICD-10-CM | POA: Diagnosis not present

## 2020-05-04 DIAGNOSIS — R278 Other lack of coordination: Secondary | ICD-10-CM | POA: Diagnosis not present

## 2020-05-04 DIAGNOSIS — G253 Myoclonus: Secondary | ICD-10-CM | POA: Diagnosis not present

## 2020-05-04 DIAGNOSIS — M6389 Disorders of muscle in diseases classified elsewhere, multiple sites: Secondary | ICD-10-CM | POA: Diagnosis not present

## 2020-05-05 DIAGNOSIS — Z8673 Personal history of transient ischemic attack (TIA), and cerebral infarction without residual deficits: Secondary | ICD-10-CM | POA: Diagnosis not present

## 2020-05-05 DIAGNOSIS — R278 Other lack of coordination: Secondary | ICD-10-CM | POA: Diagnosis not present

## 2020-05-05 DIAGNOSIS — G253 Myoclonus: Secondary | ICD-10-CM | POA: Diagnosis not present

## 2020-05-05 DIAGNOSIS — R293 Abnormal posture: Secondary | ICD-10-CM | POA: Diagnosis not present

## 2020-05-05 DIAGNOSIS — F0280 Dementia in other diseases classified elsewhere without behavioral disturbance: Secondary | ICD-10-CM | POA: Diagnosis not present

## 2020-05-05 DIAGNOSIS — M6389 Disorders of muscle in diseases classified elsewhere, multiple sites: Secondary | ICD-10-CM | POA: Diagnosis not present

## 2020-05-09 DIAGNOSIS — G253 Myoclonus: Secondary | ICD-10-CM | POA: Diagnosis not present

## 2020-05-09 DIAGNOSIS — Z8673 Personal history of transient ischemic attack (TIA), and cerebral infarction without residual deficits: Secondary | ICD-10-CM | POA: Diagnosis not present

## 2020-05-09 DIAGNOSIS — R293 Abnormal posture: Secondary | ICD-10-CM | POA: Diagnosis not present

## 2020-05-09 DIAGNOSIS — M6389 Disorders of muscle in diseases classified elsewhere, multiple sites: Secondary | ICD-10-CM | POA: Diagnosis not present

## 2020-05-09 DIAGNOSIS — F0280 Dementia in other diseases classified elsewhere without behavioral disturbance: Secondary | ICD-10-CM | POA: Diagnosis not present

## 2020-05-09 DIAGNOSIS — R278 Other lack of coordination: Secondary | ICD-10-CM | POA: Diagnosis not present

## 2020-05-10 DIAGNOSIS — R293 Abnormal posture: Secondary | ICD-10-CM | POA: Diagnosis not present

## 2020-05-10 DIAGNOSIS — M6389 Disorders of muscle in diseases classified elsewhere, multiple sites: Secondary | ICD-10-CM | POA: Diagnosis not present

## 2020-05-10 DIAGNOSIS — F0280 Dementia in other diseases classified elsewhere without behavioral disturbance: Secondary | ICD-10-CM | POA: Diagnosis not present

## 2020-05-10 DIAGNOSIS — R278 Other lack of coordination: Secondary | ICD-10-CM | POA: Diagnosis not present

## 2020-05-10 DIAGNOSIS — G253 Myoclonus: Secondary | ICD-10-CM | POA: Diagnosis not present

## 2020-05-10 DIAGNOSIS — Z8673 Personal history of transient ischemic attack (TIA), and cerebral infarction without residual deficits: Secondary | ICD-10-CM | POA: Diagnosis not present

## 2020-05-11 DIAGNOSIS — G253 Myoclonus: Secondary | ICD-10-CM | POA: Diagnosis not present

## 2020-05-11 DIAGNOSIS — R278 Other lack of coordination: Secondary | ICD-10-CM | POA: Diagnosis not present

## 2020-05-11 DIAGNOSIS — Z8673 Personal history of transient ischemic attack (TIA), and cerebral infarction without residual deficits: Secondary | ICD-10-CM | POA: Diagnosis not present

## 2020-05-11 DIAGNOSIS — M6389 Disorders of muscle in diseases classified elsewhere, multiple sites: Secondary | ICD-10-CM | POA: Diagnosis not present

## 2020-05-11 DIAGNOSIS — R293 Abnormal posture: Secondary | ICD-10-CM | POA: Diagnosis not present

## 2020-05-11 DIAGNOSIS — F0280 Dementia in other diseases classified elsewhere without behavioral disturbance: Secondary | ICD-10-CM | POA: Diagnosis not present

## 2020-05-12 DIAGNOSIS — R278 Other lack of coordination: Secondary | ICD-10-CM | POA: Diagnosis not present

## 2020-05-12 DIAGNOSIS — Z9189 Other specified personal risk factors, not elsewhere classified: Secondary | ICD-10-CM | POA: Diagnosis not present

## 2020-05-12 DIAGNOSIS — R293 Abnormal posture: Secondary | ICD-10-CM | POA: Diagnosis not present

## 2020-05-12 DIAGNOSIS — Z8673 Personal history of transient ischemic attack (TIA), and cerebral infarction without residual deficits: Secondary | ICD-10-CM | POA: Diagnosis not present

## 2020-05-12 DIAGNOSIS — F0280 Dementia in other diseases classified elsewhere without behavioral disturbance: Secondary | ICD-10-CM | POA: Diagnosis not present

## 2020-05-12 DIAGNOSIS — M6389 Disorders of muscle in diseases classified elsewhere, multiple sites: Secondary | ICD-10-CM | POA: Diagnosis not present

## 2020-05-12 DIAGNOSIS — G253 Myoclonus: Secondary | ICD-10-CM | POA: Diagnosis not present

## 2020-05-12 DIAGNOSIS — Z20828 Contact with and (suspected) exposure to other viral communicable diseases: Secondary | ICD-10-CM | POA: Diagnosis not present

## 2020-05-15 DIAGNOSIS — R293 Abnormal posture: Secondary | ICD-10-CM | POA: Diagnosis not present

## 2020-05-15 DIAGNOSIS — M6389 Disorders of muscle in diseases classified elsewhere, multiple sites: Secondary | ICD-10-CM | POA: Diagnosis not present

## 2020-05-15 DIAGNOSIS — G253 Myoclonus: Secondary | ICD-10-CM | POA: Diagnosis not present

## 2020-05-15 DIAGNOSIS — Z8673 Personal history of transient ischemic attack (TIA), and cerebral infarction without residual deficits: Secondary | ICD-10-CM | POA: Diagnosis not present

## 2020-05-15 DIAGNOSIS — R278 Other lack of coordination: Secondary | ICD-10-CM | POA: Diagnosis not present

## 2020-05-15 DIAGNOSIS — F0280 Dementia in other diseases classified elsewhere without behavioral disturbance: Secondary | ICD-10-CM | POA: Diagnosis not present

## 2020-05-17 ENCOUNTER — Encounter: Payer: Self-pay | Admitting: Nurse Practitioner

## 2020-05-17 ENCOUNTER — Encounter (INDEPENDENT_AMBULATORY_CARE_PROVIDER_SITE_OTHER): Payer: Medicare Other | Admitting: Ophthalmology

## 2020-05-17 ENCOUNTER — Other Ambulatory Visit: Payer: Self-pay

## 2020-05-17 ENCOUNTER — Non-Acute Institutional Stay: Payer: Medicare Other | Admitting: Nurse Practitioner

## 2020-05-17 DIAGNOSIS — M6389 Disorders of muscle in diseases classified elsewhere, multiple sites: Secondary | ICD-10-CM | POA: Diagnosis not present

## 2020-05-17 DIAGNOSIS — G253 Myoclonus: Secondary | ICD-10-CM | POA: Diagnosis not present

## 2020-05-17 DIAGNOSIS — F0280 Dementia in other diseases classified elsewhere without behavioral disturbance: Secondary | ICD-10-CM | POA: Diagnosis not present

## 2020-05-17 DIAGNOSIS — Z515 Encounter for palliative care: Secondary | ICD-10-CM

## 2020-05-17 DIAGNOSIS — R278 Other lack of coordination: Secondary | ICD-10-CM | POA: Diagnosis not present

## 2020-05-17 DIAGNOSIS — R293 Abnormal posture: Secondary | ICD-10-CM | POA: Diagnosis not present

## 2020-05-17 DIAGNOSIS — F0390 Unspecified dementia without behavioral disturbance: Secondary | ICD-10-CM

## 2020-05-17 DIAGNOSIS — Z8673 Personal history of transient ischemic attack (TIA), and cerebral infarction without residual deficits: Secondary | ICD-10-CM | POA: Diagnosis not present

## 2020-05-17 NOTE — Progress Notes (Signed)
[facility] Consult Note Telephone: [phone]  Fax: [phone]  PATIENT NAME: Spencer Underwood, [place] [address] [phone] (home)  DOB: [birth year] MRN: [id number]  PRIMARY CARE PROVIDER:    [name], MD,  [place] [address] [phone]  REFERRING PROVIDER:   [name], MD [address] [place],   [address] [phone]  RESPONSIBLE PARTY:   Extended Emergency Contact Information Primary Emergency Contact: Bounds,Jane Address: [place], [place] of [place] Phone: [phone] Mobile Phone: [phone] Relation: Spouse Secondary Emergency Contact: [place] of [place] Phone: [phone] Relation: Daughter  I met face to face with patient and wife in facility.  ASSESSMENT AND RECOMMENDATIONS:   1. Advance Care Planning/Goals of Care: Goals include to maximize quality of life and symptom management. Our advance care planning conversation included a discussion about the value and importance of advance care planning. Visit today consisted of building trust and discussions on Palliative care medicine as a specialized medical care for people living with serious illness, aimed at facilitating better quality of life through symptoms relief, assisting with advance care plan and establishing goals of care. Wife expressed appreciation for education provided on Palliative care and how that differs from Hospice care service.   Goal of care: Goal of care for patient is comfort and function. Wife wants patient to enjoy much of life as he can. Wife report patient as a very active person, that enjoys the outdoors. 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 Wife said patient still taps his feet to music in the facility during musical activity. Directives: Patient has a living  will. Patient's code status is DNR. Wife report DNR form and MOST form on file in the facility. Will request copy to be uploaded to [facility].  2. 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 Patient has episodes of twitching of his body/spasm. He had 2 EEGs and seizure was ruled out. Patient continues to have episodes of spasm and rigidity especially during personal care which is posing problem for his care givers in the facility. Patient was seen in consultation by Dr. Hollace Kinnier with [facility] senior care. Recommendation was made for Keppra. Patient wife expressed concern regarding starting patient on a new med given patients history of negative reaction to medications. Discussed wife concerns and validated her fears. Reviewed risk vs benefit of trying patient on Keppra. Discussed benefit to include possible relief of rigidity, ease of patient care, and reduction of patient agitation/anxiety during the provision of personal care. Patient is reported to hold on to staff tightly during personal care. Wife agreed to a try of Jodi Marble, noting that it will be discontinues promptly if any indication of negative effect. Wife verbalized desire to keep patient's PCP [name] as his attending while he is in the nursing home as [name] has been his PCP for a long time. Wife will reach out to [name] for prescription. Palliative care will continue to provide support to patient, family and the medical team.  3. Follow up Palliative Care Visit: Palliative care will continue to follow for goals of care clarification and symptom management. Return 4 weeks or prn.  4. 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  Married to wife for 58yr, has 2 children who lives out of town. Patient and wife receives support from friends and their church community. Patient has a personal care  aide who assist in provision of his ALDs.  5. Cognitive / Functional decline: Patient is non-verbal, would track voice with eyes occasionally. Dependent on persons for all of his ADLs including feeding. He has a history of repeated falls, now wheelchair dependent requires hoyer lift for transfers. Per wife, pt was walking 2 months ago, stopped talking 6-7 weeks ago. Patient has no dysphagia.  I spent 90 minutes providing this consultation, time includes time spent with patient and wife, chart review, and documentation. More than 50% of the time in this consultation was spent coordinating communication.   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 Patient was diagnosed with dementia with visual impairment due to posterior cerebral cortical atrophy in March 2020, stopped driving in July of 20174 Since diagnosis, patient has been experiencing progressive decline in cognition and function. Patient has been a resident of nursing home for the past 7 weeks. Palliative Care was asked to follow this patient by consultation request of PCrist Infante MD to help address advance care planning and goals of care. This is an initial visit.  CODE STATUS: DNR  PPS: 20%  HOSPICE ELIGIBILITY/DIAGNOSIS: Wife expressed unreadiness for Hospice care services at this time, wants patient to continue on Aricept. Wife believed Aricept enhanced patient muscle tone. Wife believed patient became floppy when taken off Aricept.   PAST MEDICAL HISTORY:  Past Medical History:  Diagnosis Date  . Allergy   . Arthritis   . [place]   . Cataract   . Chronic kidney disease    atrophic left kidney  . Coronary atherosclerosis    a. 2012 - incidentally noted on CT scan of chest;  b. 08/2011 Myoview: normal; c. 03/2015 CPX @ [facility]: normal w/ PAC's, PVC's, and a 3 beat run of NSVT  in recovery.  .Marland KitchenHeart murmur    asymptomatic function murmur.  .Marland KitchenHistory of echocardiogram    a. 12/2004 Echo: Nl EF, trace TR/MR.  .Marland KitchenHx of radiation therapy   . Hyperlipidemia   . Personal history of malignant neoplasm of prostate    a. 2008 s/p prostatecomtomy-->radiation-->currently on Lupron injections.  . Sinus bradycardia   . Syncope    a. 12/2016    SOCIAL HX:  Social History   Tobacco Use  . Smoking status: Never Smoker  . Smokeless tobacco: Never Used  Substance Use Topics  . Alcohol use: Yes    Alcohol/week: 1.0 standard drink    Types: 1 Cans of beer per week    Comment: occ   FAMILY HX:  Family History  Problem Relation Age of Onset  . Hypertension Mother   . Thyroid disease Mother   . Stroke Father   . Coronary artery disease Father   . Colon cancer Neg Hx   . Esophageal cancer Neg Hx   . Pancreatic cancer Neg Hx   . Rectal cancer Neg Hx   . Stomach cancer Neg Hx     ALLERGIES:  Allergies  Allergen Reactions  . Atorvastatin Other (See Comments)    Caused confusion     PERTINENT MEDICATIONS:  Outpatient Encounter Medications as of 05/17/2020  Medication Sig  . donepezil (ARICEPT)  10 MG tablet Take 10 mg by mouth at bedtime.  . melatonin 3 MG TABS tablet Take 3 mg by mouth at bedtime.   No facility-administered encounter medications on file as of 05/17/2020.    PHYSICAL EXAM / ROS:   Current and past weights: 123.6lbs down from 130lbs last month. BMI 13.6kg/m2 General: frail appearing, thin, alert and awake, sitting in wheelchair in NAD Cardiovascular: no chest pain reported, no edema, S1S2 normal Pulmonary: no cough, no increased SOB, room air Abdomen: appetite fair, no report of constipation, incontinent of bowel GU:  incontinent of urine MSK:  Loss of muscle mass, no joint and ROM abnormalities Skin: no rashes or wounds reported or noted on exposed skin Neurological: Coordination abnormality, occasional jerking movement   Jari Favre,  DNP, AGPCNP-BC

## 2020-05-18 DIAGNOSIS — G253 Myoclonus: Secondary | ICD-10-CM | POA: Diagnosis not present

## 2020-05-18 DIAGNOSIS — R293 Abnormal posture: Secondary | ICD-10-CM | POA: Diagnosis not present

## 2020-05-18 DIAGNOSIS — R278 Other lack of coordination: Secondary | ICD-10-CM | POA: Diagnosis not present

## 2020-05-18 DIAGNOSIS — M6389 Disorders of muscle in diseases classified elsewhere, multiple sites: Secondary | ICD-10-CM | POA: Diagnosis not present

## 2020-05-18 DIAGNOSIS — F0280 Dementia in other diseases classified elsewhere without behavioral disturbance: Secondary | ICD-10-CM | POA: Diagnosis not present

## 2020-05-18 DIAGNOSIS — Z8673 Personal history of transient ischemic attack (TIA), and cerebral infarction without residual deficits: Secondary | ICD-10-CM | POA: Diagnosis not present

## 2020-05-19 DIAGNOSIS — M6389 Disorders of muscle in diseases classified elsewhere, multiple sites: Secondary | ICD-10-CM | POA: Diagnosis not present

## 2020-05-19 DIAGNOSIS — R278 Other lack of coordination: Secondary | ICD-10-CM | POA: Diagnosis not present

## 2020-05-19 DIAGNOSIS — R293 Abnormal posture: Secondary | ICD-10-CM | POA: Diagnosis not present

## 2020-05-19 DIAGNOSIS — Z8673 Personal history of transient ischemic attack (TIA), and cerebral infarction without residual deficits: Secondary | ICD-10-CM | POA: Diagnosis not present

## 2020-05-19 DIAGNOSIS — F0280 Dementia in other diseases classified elsewhere without behavioral disturbance: Secondary | ICD-10-CM | POA: Diagnosis not present

## 2020-05-19 DIAGNOSIS — G253 Myoclonus: Secondary | ICD-10-CM | POA: Diagnosis not present

## 2020-05-22 DIAGNOSIS — R293 Abnormal posture: Secondary | ICD-10-CM | POA: Diagnosis not present

## 2020-05-22 DIAGNOSIS — R278 Other lack of coordination: Secondary | ICD-10-CM | POA: Diagnosis not present

## 2020-05-22 DIAGNOSIS — F0280 Dementia in other diseases classified elsewhere without behavioral disturbance: Secondary | ICD-10-CM | POA: Diagnosis not present

## 2020-05-22 DIAGNOSIS — Z8673 Personal history of transient ischemic attack (TIA), and cerebral infarction without residual deficits: Secondary | ICD-10-CM | POA: Diagnosis not present

## 2020-05-22 DIAGNOSIS — G253 Myoclonus: Secondary | ICD-10-CM | POA: Diagnosis not present

## 2020-05-22 DIAGNOSIS — M6389 Disorders of muscle in diseases classified elsewhere, multiple sites: Secondary | ICD-10-CM | POA: Diagnosis not present

## 2020-05-22 DIAGNOSIS — N390 Urinary tract infection, site not specified: Secondary | ICD-10-CM | POA: Diagnosis not present

## 2020-05-23 DIAGNOSIS — R278 Other lack of coordination: Secondary | ICD-10-CM | POA: Diagnosis not present

## 2020-05-23 DIAGNOSIS — M6389 Disorders of muscle in diseases classified elsewhere, multiple sites: Secondary | ICD-10-CM | POA: Diagnosis not present

## 2020-05-23 DIAGNOSIS — F0280 Dementia in other diseases classified elsewhere without behavioral disturbance: Secondary | ICD-10-CM | POA: Diagnosis not present

## 2020-05-23 DIAGNOSIS — Z8673 Personal history of transient ischemic attack (TIA), and cerebral infarction without residual deficits: Secondary | ICD-10-CM | POA: Diagnosis not present

## 2020-05-23 DIAGNOSIS — G253 Myoclonus: Secondary | ICD-10-CM | POA: Diagnosis not present

## 2020-05-23 DIAGNOSIS — R293 Abnormal posture: Secondary | ICD-10-CM | POA: Diagnosis not present

## 2020-05-24 DIAGNOSIS — D649 Anemia, unspecified: Secondary | ICD-10-CM | POA: Diagnosis not present

## 2020-05-24 DIAGNOSIS — K922 Gastrointestinal hemorrhage, unspecified: Secondary | ICD-10-CM | POA: Diagnosis not present

## 2020-05-24 DIAGNOSIS — Z23 Encounter for immunization: Secondary | ICD-10-CM | POA: Diagnosis not present

## 2020-05-24 DIAGNOSIS — M6389 Disorders of muscle in diseases classified elsewhere, multiple sites: Secondary | ICD-10-CM | POA: Diagnosis not present

## 2020-05-24 DIAGNOSIS — R278 Other lack of coordination: Secondary | ICD-10-CM | POA: Diagnosis not present

## 2020-05-24 DIAGNOSIS — R293 Abnormal posture: Secondary | ICD-10-CM | POA: Diagnosis not present

## 2020-05-24 DIAGNOSIS — Z20828 Contact with and (suspected) exposure to other viral communicable diseases: Secondary | ICD-10-CM | POA: Diagnosis not present

## 2020-05-24 DIAGNOSIS — G4733 Obstructive sleep apnea (adult) (pediatric): Secondary | ICD-10-CM | POA: Diagnosis not present

## 2020-05-24 DIAGNOSIS — G253 Myoclonus: Secondary | ICD-10-CM | POA: Diagnosis not present

## 2020-05-24 DIAGNOSIS — F0280 Dementia in other diseases classified elsewhere without behavioral disturbance: Secondary | ICD-10-CM | POA: Diagnosis not present

## 2020-05-24 DIAGNOSIS — Z9189 Other specified personal risk factors, not elsewhere classified: Secondary | ICD-10-CM | POA: Diagnosis not present

## 2020-05-24 DIAGNOSIS — Z8673 Personal history of transient ischemic attack (TIA), and cerebral infarction without residual deficits: Secondary | ICD-10-CM | POA: Diagnosis not present

## 2020-05-24 DIAGNOSIS — F0390 Unspecified dementia without behavioral disturbance: Secondary | ICD-10-CM | POA: Diagnosis not present

## 2020-05-24 DIAGNOSIS — M810 Age-related osteoporosis without current pathological fracture: Secondary | ICD-10-CM | POA: Diagnosis not present

## 2020-05-24 DIAGNOSIS — G459 Transient cerebral ischemic attack, unspecified: Secondary | ICD-10-CM | POA: Diagnosis not present

## 2020-05-24 DIAGNOSIS — C61 Malignant neoplasm of prostate: Secondary | ICD-10-CM | POA: Diagnosis not present

## 2020-05-24 DIAGNOSIS — R310 Gross hematuria: Secondary | ICD-10-CM | POA: Diagnosis not present

## 2020-05-25 DIAGNOSIS — R293 Abnormal posture: Secondary | ICD-10-CM | POA: Diagnosis not present

## 2020-05-25 DIAGNOSIS — G253 Myoclonus: Secondary | ICD-10-CM | POA: Diagnosis not present

## 2020-05-25 DIAGNOSIS — Z8673 Personal history of transient ischemic attack (TIA), and cerebral infarction without residual deficits: Secondary | ICD-10-CM | POA: Diagnosis not present

## 2020-05-25 DIAGNOSIS — M6389 Disorders of muscle in diseases classified elsewhere, multiple sites: Secondary | ICD-10-CM | POA: Diagnosis not present

## 2020-05-25 DIAGNOSIS — F0280 Dementia in other diseases classified elsewhere without behavioral disturbance: Secondary | ICD-10-CM | POA: Diagnosis not present

## 2020-05-25 DIAGNOSIS — R278 Other lack of coordination: Secondary | ICD-10-CM | POA: Diagnosis not present

## 2020-05-25 DIAGNOSIS — N390 Urinary tract infection, site not specified: Secondary | ICD-10-CM | POA: Diagnosis not present

## 2020-05-29 DIAGNOSIS — Z9189 Other specified personal risk factors, not elsewhere classified: Secondary | ICD-10-CM | POA: Diagnosis not present

## 2020-05-29 DIAGNOSIS — Z20828 Contact with and (suspected) exposure to other viral communicable diseases: Secondary | ICD-10-CM | POA: Diagnosis not present

## 2020-05-30 DIAGNOSIS — G253 Myoclonus: Secondary | ICD-10-CM | POA: Diagnosis not present

## 2020-05-30 DIAGNOSIS — R278 Other lack of coordination: Secondary | ICD-10-CM | POA: Diagnosis not present

## 2020-05-30 DIAGNOSIS — R293 Abnormal posture: Secondary | ICD-10-CM | POA: Diagnosis not present

## 2020-05-30 DIAGNOSIS — Z8673 Personal history of transient ischemic attack (TIA), and cerebral infarction without residual deficits: Secondary | ICD-10-CM | POA: Diagnosis not present

## 2020-05-30 DIAGNOSIS — F0280 Dementia in other diseases classified elsewhere without behavioral disturbance: Secondary | ICD-10-CM | POA: Diagnosis not present

## 2020-05-30 DIAGNOSIS — M6389 Disorders of muscle in diseases classified elsewhere, multiple sites: Secondary | ICD-10-CM | POA: Diagnosis not present

## 2020-05-31 DIAGNOSIS — F0280 Dementia in other diseases classified elsewhere without behavioral disturbance: Secondary | ICD-10-CM | POA: Diagnosis not present

## 2020-05-31 DIAGNOSIS — M6389 Disorders of muscle in diseases classified elsewhere, multiple sites: Secondary | ICD-10-CM | POA: Diagnosis not present

## 2020-05-31 DIAGNOSIS — G253 Myoclonus: Secondary | ICD-10-CM | POA: Diagnosis not present

## 2020-05-31 DIAGNOSIS — Z8673 Personal history of transient ischemic attack (TIA), and cerebral infarction without residual deficits: Secondary | ICD-10-CM | POA: Diagnosis not present

## 2020-05-31 DIAGNOSIS — R278 Other lack of coordination: Secondary | ICD-10-CM | POA: Diagnosis not present

## 2020-05-31 DIAGNOSIS — R293 Abnormal posture: Secondary | ICD-10-CM | POA: Diagnosis not present

## 2020-06-01 DIAGNOSIS — R310 Gross hematuria: Secondary | ICD-10-CM | POA: Diagnosis not present

## 2020-06-02 DIAGNOSIS — G253 Myoclonus: Secondary | ICD-10-CM | POA: Diagnosis not present

## 2020-06-02 DIAGNOSIS — M6389 Disorders of muscle in diseases classified elsewhere, multiple sites: Secondary | ICD-10-CM | POA: Diagnosis not present

## 2020-06-02 DIAGNOSIS — R1312 Dysphagia, oropharyngeal phase: Secondary | ICD-10-CM | POA: Diagnosis not present

## 2020-06-02 DIAGNOSIS — I6389 Other cerebral infarction: Secondary | ICD-10-CM | POA: Diagnosis not present

## 2020-06-02 DIAGNOSIS — R293 Abnormal posture: Secondary | ICD-10-CM | POA: Diagnosis not present

## 2020-06-02 DIAGNOSIS — Z8673 Personal history of transient ischemic attack (TIA), and cerebral infarction without residual deficits: Secondary | ICD-10-CM | POA: Diagnosis not present

## 2020-06-02 DIAGNOSIS — F0280 Dementia in other diseases classified elsewhere without behavioral disturbance: Secondary | ICD-10-CM | POA: Diagnosis not present

## 2020-06-02 DIAGNOSIS — R278 Other lack of coordination: Secondary | ICD-10-CM | POA: Diagnosis not present

## 2020-06-05 DIAGNOSIS — F0280 Dementia in other diseases classified elsewhere without behavioral disturbance: Secondary | ICD-10-CM | POA: Diagnosis not present

## 2020-06-05 DIAGNOSIS — R293 Abnormal posture: Secondary | ICD-10-CM | POA: Diagnosis not present

## 2020-06-05 DIAGNOSIS — I6389 Other cerebral infarction: Secondary | ICD-10-CM | POA: Diagnosis not present

## 2020-06-05 DIAGNOSIS — R1312 Dysphagia, oropharyngeal phase: Secondary | ICD-10-CM | POA: Diagnosis not present

## 2020-06-05 DIAGNOSIS — R278 Other lack of coordination: Secondary | ICD-10-CM | POA: Diagnosis not present

## 2020-06-05 DIAGNOSIS — M6389 Disorders of muscle in diseases classified elsewhere, multiple sites: Secondary | ICD-10-CM | POA: Diagnosis not present

## 2020-06-06 DIAGNOSIS — I6389 Other cerebral infarction: Secondary | ICD-10-CM | POA: Diagnosis not present

## 2020-06-06 DIAGNOSIS — R1312 Dysphagia, oropharyngeal phase: Secondary | ICD-10-CM | POA: Diagnosis not present

## 2020-06-06 DIAGNOSIS — R293 Abnormal posture: Secondary | ICD-10-CM | POA: Diagnosis not present

## 2020-06-06 DIAGNOSIS — R278 Other lack of coordination: Secondary | ICD-10-CM | POA: Diagnosis not present

## 2020-06-06 DIAGNOSIS — M6389 Disorders of muscle in diseases classified elsewhere, multiple sites: Secondary | ICD-10-CM | POA: Diagnosis not present

## 2020-06-06 DIAGNOSIS — F0280 Dementia in other diseases classified elsewhere without behavioral disturbance: Secondary | ICD-10-CM | POA: Diagnosis not present

## 2020-06-07 DIAGNOSIS — M6389 Disorders of muscle in diseases classified elsewhere, multiple sites: Secondary | ICD-10-CM | POA: Diagnosis not present

## 2020-06-07 DIAGNOSIS — I6389 Other cerebral infarction: Secondary | ICD-10-CM | POA: Diagnosis not present

## 2020-06-07 DIAGNOSIS — R1312 Dysphagia, oropharyngeal phase: Secondary | ICD-10-CM | POA: Diagnosis not present

## 2020-06-07 DIAGNOSIS — R293 Abnormal posture: Secondary | ICD-10-CM | POA: Diagnosis not present

## 2020-06-07 DIAGNOSIS — F0280 Dementia in other diseases classified elsewhere without behavioral disturbance: Secondary | ICD-10-CM | POA: Diagnosis not present

## 2020-06-07 DIAGNOSIS — R278 Other lack of coordination: Secondary | ICD-10-CM | POA: Diagnosis not present

## 2020-06-08 ENCOUNTER — Other Ambulatory Visit: Payer: Self-pay

## 2020-06-08 ENCOUNTER — Non-Acute Institutional Stay: Payer: Medicare Other | Admitting: Nurse Practitioner

## 2020-06-08 DIAGNOSIS — M6389 Disorders of muscle in diseases classified elsewhere, multiple sites: Secondary | ICD-10-CM | POA: Diagnosis not present

## 2020-06-08 DIAGNOSIS — F0280 Dementia in other diseases classified elsewhere without behavioral disturbance: Secondary | ICD-10-CM | POA: Diagnosis not present

## 2020-06-08 DIAGNOSIS — R1312 Dysphagia, oropharyngeal phase: Secondary | ICD-10-CM | POA: Diagnosis not present

## 2020-06-08 DIAGNOSIS — R278 Other lack of coordination: Secondary | ICD-10-CM | POA: Diagnosis not present

## 2020-06-08 DIAGNOSIS — I6389 Other cerebral infarction: Secondary | ICD-10-CM | POA: Diagnosis not present

## 2020-06-08 DIAGNOSIS — F0391 Unspecified dementia with behavioral disturbance: Secondary | ICD-10-CM

## 2020-06-08 DIAGNOSIS — Z515 Encounter for palliative care: Secondary | ICD-10-CM | POA: Diagnosis not present

## 2020-06-08 DIAGNOSIS — R293 Abnormal posture: Secondary | ICD-10-CM | POA: Diagnosis not present

## 2020-06-08 NOTE — Progress Notes (Signed)
[facility] Consult Note Telephone: [phone]  Fax: [phone]  PATIENT NAME: Spencer Underwood, [place] [address] [phone] (home)  DOB: 1940-10-23 MRN: [id number]  PRIMARY CARE PROVIDER:    [name], MD,  [place] [address] [phone]  REFERRING PROVIDER:   [name], MD [address] [place],  [place] [address] [phone]  RESPONSIBLE PARTY:   Extended Emergency Contact Information Primary Emergency Contact: Dimitroff,Jane Address: [place], [place] of [place] Phone: [phone] Mobile Phone: [phone] Relation: Spouse Secondary Emergency Contact: [place] of [place] Phone: [phone] Relation: Daughter  I met face to face with patient and wife in facility.  ASSESSMENT AND RECOMMENDATIONS:   1. Advance Care Planning/Goals of Care:  Goal of care: Goal of care for patient is comfort and function. Wife wants patient to enjoy much of life as he can. Wife report patient as an active person, that enjoys the outdoors. 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  Directives: Patient has a living will. Code status is DNR. DNR and MOST form on chart in facility and on [facility] EMR.  2. 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 Patient tend to tighten his muscle when being provided personal care, was started on Keppra to reduce symptoms. He is on day 3 of Keppra 240m PO BID. Patient's care giver KMaudie Mercurypresent during visit today report no change in pateint's condition since the start of Keppra. Wife report she believed the KJodi Marbleis making patient sleepy and often missing meals. Wife believed this is causing worsening of his weight loss. Wife is suggesting Keppra be stopped if it is  not improving patient's condition. Wife said she is planning to review need to stop Keppra with patient's PCP. Discussed with wife about risk versus benefit of treating patient's agitation during personal care as most medication that can be used could be sedating. Wife verbalized understanding, saying she does not want patient sleepy all the time and not able to eat. Validation provided. Patient with ongoing and worsening muscle wasting and weight loss. Patient's current weight 115.5lbs down from 123.6 at last palliative care visit 3 weeks ago. Patient on Ensure nutritional supplement, intake is about 50% of meal. Staff report he has a difficult time opening his mouth during feeding. Patient appeared catechetic putting him at an increased risk for pressure ulcer at pressure points. Recommended patient have air mattress, request faxed to Dr. PJoylene Draftoffice for orders.  Discussed and reviewed disease trajectory of dementia with wife. Wife expressed good understanding that dementia is progressive and terminal and likely to eventually lead to dysphagia, worsening weight loss, immobility and eventually death. Emotional and supportive care provided. Patient reported to have dark urine for about a month now. Infection and neoplasm has been ruled out by Urology, Hgb 13.6. Per wife, urology is planing to take patient for explorative procedure within the next 2 weeks to check his Urethra and bladder for cause of symptom, patient has a hx of prostate cancer with radiation treatment in the past. Wife report urine appeared to be getting lighter. Patient does not appear to be in any acute distress on exam today, no report of fever or chills. Patient however noted with occasional grimacing during visit. Patient may benefit  from scheduling pain medication, twice or three times a day as patient unable to verbalize if in pain or having discomfort. Dark urine could also be from dehydration. Recommended increasing patient's water  intake, offer patient 167m of water every 2-4hrs x 48hrs to improve hydration. Will discuss recommendation with Dr. PJoylene Draft  3. Follow up Palliative Care Visit: Palliative care will continue to follow for goals of care clarification and symptom management. Return 3 weeks or prn.  4. 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 Patient and wife receives support from friends and their church community. Patient has a personal care aide who assist in provision of his ALDs.  5. Cognitive / Functional decline: Patient is non-verbal. Dependent on persons for all of his ADLs including feeding. He has a history of repeated falls, now wheelchair dependent, requires hoyer lift for transfers. Per wife, pt was walking 2 months ago, stopped talking 2 moths ago. Patient on regular diet, requires cutting up food, staff report occassional pocketing of food in mouth.  I spent 60 minutes providing this consultation, time includes time spent with patient and wife, chart review, provider coordination, and documentation. Morethan 50% of the time in this consultation was spent coordinating communication.   HISTORY OF PRESENT ILLNESS:  LlDeeann Underwood is a [age].o. year old male with multiple medical problems including Dementia (FAST score 7d), CKD, hx of prostate cancer was on Lupron till 01/2019. 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 Patient has been a resident of nursing home for the past 10 weeks. Palliative Care was asked to follow this patient by consultation request of PeCrist InfanteMD to help address advance care planning and goals of care. This is a follow up visit form  05/17/2020.  CODE STATUS: DNR  PPS: 30%  HOSPICE ELIGIBILITY/DIAGNOSIS: TBD  PAST MEDICAL HISTORY:  Past Medical History:  Diagnosis Date  . Allergy   . Arthritis   . Cancer ([place]  . Cataract   . Chronic kidney disease    atrophic left kidney  . Coronary atherosclerosis    a. 2012 - incidentally noted on CT scan of chest;  b. 08/2011 Myoview: normal; c. 03/2015 CPX @ [facility]: normal w/ PAC's, PVC's, and a 3 beat run of NSVT in recovery.  . Marland Kitcheneart murmur    asymptomatic function murmur.  . Marland Kitchenistory of echocardiogram    a. 12/2004 Echo: Nl EF, trace TR/MR.  . Marland Kitchenx of radiation therapy   . Hyperlipidemia   . Personal history of malignant neoplasm of prostate    a. 2008 s/p prostatecomtomy-->radiation-->currently on Lupron injections.  . Sinus bradycardia   . Syncope    a. 12/2016    SOCIAL HX:  Social History   Tobacco Use  . Smoking status: Never Smoker  . Smokeless tobacco: Never Used  Substance Use Topics  . Alcohol use: Yes    Alcohol/week: 1.0 standard drink    Types: 1 Cans of beer per week    Comment: occ   FAMILY HX:  Family History  Problem Relation Age of Onset  . Hypertension Mother   . Thyroid disease Mother   . Stroke Father   . Coronary artery disease Father   .  Colon cancer Neg Hx   . Esophageal cancer Neg Hx   . Pancreatic cancer Neg Hx   . Rectal cancer Neg Hx   . Stomach cancer Neg Hx     ALLERGIES:  Allergies  Allergen Reactions  . Atorvastatin Other (See Comments)    Caused confusion     PERTINENT MEDICATIONS:  Outpatient Encounter Medications as of 06/08/2020  Medication Sig  . donepezil (ARICEPT) 10 MG tablet Take 10 mg by mouth at bedtime.  . melatonin 3 MG TABS tablet Take 3 mg by mouth at bedtime.   No facility-administered encounter medications on file as of 06/08/2020.    PHYSICAL EXAM / ROS:   Current and past weights: 115.5lbs down from 123.6lbs last month. Ht 16f8", BMI 17.6kg/m2 General: frail appearing, catechetic, appeared  drowsy, sitting in wheelchair in NAD Cardiovascular: no chest pain reported, no edema, S1S2 normal Pulmonary: no cough, no SOB, room air Abdomen: appetite poor, no report of constipation, incontinent of bowel GU:  incontinent of urine MSK:   muscle wasting, no joint and ROM abnormalities Skin: no rashes or wounds reported or noted on exposed skin Neurological: Coordination abnormality, occasional jerking movements   QJari Favre, DNP, AGPCNP-BC

## 2020-06-09 DIAGNOSIS — M6389 Disorders of muscle in diseases classified elsewhere, multiple sites: Secondary | ICD-10-CM | POA: Diagnosis not present

## 2020-06-09 DIAGNOSIS — I6389 Other cerebral infarction: Secondary | ICD-10-CM | POA: Diagnosis not present

## 2020-06-09 DIAGNOSIS — R293 Abnormal posture: Secondary | ICD-10-CM | POA: Diagnosis not present

## 2020-06-09 DIAGNOSIS — R1312 Dysphagia, oropharyngeal phase: Secondary | ICD-10-CM | POA: Diagnosis not present

## 2020-06-09 DIAGNOSIS — R278 Other lack of coordination: Secondary | ICD-10-CM | POA: Diagnosis not present

## 2020-06-09 DIAGNOSIS — F0280 Dementia in other diseases classified elsewhere without behavioral disturbance: Secondary | ICD-10-CM | POA: Diagnosis not present

## 2020-06-13 DIAGNOSIS — R1312 Dysphagia, oropharyngeal phase: Secondary | ICD-10-CM | POA: Diagnosis not present

## 2020-06-13 DIAGNOSIS — M6389 Disorders of muscle in diseases classified elsewhere, multiple sites: Secondary | ICD-10-CM | POA: Diagnosis not present

## 2020-06-13 DIAGNOSIS — R278 Other lack of coordination: Secondary | ICD-10-CM | POA: Diagnosis not present

## 2020-06-13 DIAGNOSIS — F0280 Dementia in other diseases classified elsewhere without behavioral disturbance: Secondary | ICD-10-CM | POA: Diagnosis not present

## 2020-06-13 DIAGNOSIS — I6389 Other cerebral infarction: Secondary | ICD-10-CM | POA: Diagnosis not present

## 2020-06-13 DIAGNOSIS — R293 Abnormal posture: Secondary | ICD-10-CM | POA: Diagnosis not present

## 2020-06-14 DIAGNOSIS — R293 Abnormal posture: Secondary | ICD-10-CM | POA: Diagnosis not present

## 2020-06-14 DIAGNOSIS — R1312 Dysphagia, oropharyngeal phase: Secondary | ICD-10-CM | POA: Diagnosis not present

## 2020-06-14 DIAGNOSIS — M6389 Disorders of muscle in diseases classified elsewhere, multiple sites: Secondary | ICD-10-CM | POA: Diagnosis not present

## 2020-06-14 DIAGNOSIS — R278 Other lack of coordination: Secondary | ICD-10-CM | POA: Diagnosis not present

## 2020-06-14 DIAGNOSIS — F0280 Dementia in other diseases classified elsewhere without behavioral disturbance: Secondary | ICD-10-CM | POA: Diagnosis not present

## 2020-06-14 DIAGNOSIS — I6389 Other cerebral infarction: Secondary | ICD-10-CM | POA: Diagnosis not present

## 2020-06-15 DIAGNOSIS — R293 Abnormal posture: Secondary | ICD-10-CM | POA: Diagnosis not present

## 2020-06-15 DIAGNOSIS — I6389 Other cerebral infarction: Secondary | ICD-10-CM | POA: Diagnosis not present

## 2020-06-15 DIAGNOSIS — R1312 Dysphagia, oropharyngeal phase: Secondary | ICD-10-CM | POA: Diagnosis not present

## 2020-06-15 DIAGNOSIS — R278 Other lack of coordination: Secondary | ICD-10-CM | POA: Diagnosis not present

## 2020-06-15 DIAGNOSIS — M6389 Disorders of muscle in diseases classified elsewhere, multiple sites: Secondary | ICD-10-CM | POA: Diagnosis not present

## 2020-06-15 DIAGNOSIS — F0280 Dementia in other diseases classified elsewhere without behavioral disturbance: Secondary | ICD-10-CM | POA: Diagnosis not present

## 2020-06-16 DIAGNOSIS — R293 Abnormal posture: Secondary | ICD-10-CM | POA: Diagnosis not present

## 2020-06-16 DIAGNOSIS — I6389 Other cerebral infarction: Secondary | ICD-10-CM | POA: Diagnosis not present

## 2020-06-16 DIAGNOSIS — R278 Other lack of coordination: Secondary | ICD-10-CM | POA: Diagnosis not present

## 2020-06-16 DIAGNOSIS — F0280 Dementia in other diseases classified elsewhere without behavioral disturbance: Secondary | ICD-10-CM | POA: Diagnosis not present

## 2020-06-16 DIAGNOSIS — R1312 Dysphagia, oropharyngeal phase: Secondary | ICD-10-CM | POA: Diagnosis not present

## 2020-06-16 DIAGNOSIS — M6389 Disorders of muscle in diseases classified elsewhere, multiple sites: Secondary | ICD-10-CM | POA: Diagnosis not present

## 2020-06-18 DIAGNOSIS — Z681 Body mass index (BMI) 19 or less, adult: Secondary | ICD-10-CM | POA: Diagnosis not present

## 2020-06-18 DIAGNOSIS — F0280 Dementia in other diseases classified elsewhere without behavioral disturbance: Secondary | ICD-10-CM | POA: Diagnosis not present

## 2020-06-18 DIAGNOSIS — R634 Abnormal weight loss: Secondary | ICD-10-CM | POA: Diagnosis not present

## 2020-06-18 DIAGNOSIS — E46 Unspecified protein-calorie malnutrition: Secondary | ICD-10-CM | POA: Diagnosis not present

## 2020-06-18 DIAGNOSIS — Z8546 Personal history of malignant neoplasm of prostate: Secondary | ICD-10-CM | POA: Diagnosis not present

## 2020-06-18 DIAGNOSIS — Z905 Acquired absence of kidney: Secondary | ICD-10-CM | POA: Diagnosis not present

## 2020-06-18 DIAGNOSIS — G309 Alzheimer's disease, unspecified: Secondary | ICD-10-CM | POA: Diagnosis not present

## 2020-06-19 DIAGNOSIS — Z8546 Personal history of malignant neoplasm of prostate: Secondary | ICD-10-CM | POA: Diagnosis not present

## 2020-06-19 DIAGNOSIS — E46 Unspecified protein-calorie malnutrition: Secondary | ICD-10-CM | POA: Diagnosis not present

## 2020-06-19 DIAGNOSIS — R634 Abnormal weight loss: Secondary | ICD-10-CM | POA: Diagnosis not present

## 2020-06-19 DIAGNOSIS — Z905 Acquired absence of kidney: Secondary | ICD-10-CM | POA: Diagnosis not present

## 2020-06-19 DIAGNOSIS — G309 Alzheimer's disease, unspecified: Secondary | ICD-10-CM | POA: Diagnosis not present

## 2020-06-19 DIAGNOSIS — F0280 Dementia in other diseases classified elsewhere without behavioral disturbance: Secondary | ICD-10-CM | POA: Diagnosis not present

## 2020-06-20 DIAGNOSIS — G309 Alzheimer's disease, unspecified: Secondary | ICD-10-CM | POA: Diagnosis not present

## 2020-06-20 DIAGNOSIS — Z905 Acquired absence of kidney: Secondary | ICD-10-CM | POA: Diagnosis not present

## 2020-06-20 DIAGNOSIS — E46 Unspecified protein-calorie malnutrition: Secondary | ICD-10-CM | POA: Diagnosis not present

## 2020-06-20 DIAGNOSIS — F0280 Dementia in other diseases classified elsewhere without behavioral disturbance: Secondary | ICD-10-CM | POA: Diagnosis not present

## 2020-06-20 DIAGNOSIS — R634 Abnormal weight loss: Secondary | ICD-10-CM | POA: Diagnosis not present

## 2020-06-20 DIAGNOSIS — Z8546 Personal history of malignant neoplasm of prostate: Secondary | ICD-10-CM | POA: Diagnosis not present

## 2020-06-21 DIAGNOSIS — Z905 Acquired absence of kidney: Secondary | ICD-10-CM | POA: Diagnosis not present

## 2020-06-21 DIAGNOSIS — E46 Unspecified protein-calorie malnutrition: Secondary | ICD-10-CM | POA: Diagnosis not present

## 2020-06-21 DIAGNOSIS — Z8546 Personal history of malignant neoplasm of prostate: Secondary | ICD-10-CM | POA: Diagnosis not present

## 2020-06-21 DIAGNOSIS — F0280 Dementia in other diseases classified elsewhere without behavioral disturbance: Secondary | ICD-10-CM | POA: Diagnosis not present

## 2020-06-21 DIAGNOSIS — G309 Alzheimer's disease, unspecified: Secondary | ICD-10-CM | POA: Diagnosis not present

## 2020-06-21 DIAGNOSIS — R634 Abnormal weight loss: Secondary | ICD-10-CM | POA: Diagnosis not present

## 2020-06-23 DIAGNOSIS — E46 Unspecified protein-calorie malnutrition: Secondary | ICD-10-CM | POA: Diagnosis not present

## 2020-06-23 DIAGNOSIS — Z905 Acquired absence of kidney: Secondary | ICD-10-CM | POA: Diagnosis not present

## 2020-06-23 DIAGNOSIS — F0280 Dementia in other diseases classified elsewhere without behavioral disturbance: Secondary | ICD-10-CM | POA: Diagnosis not present

## 2020-06-23 DIAGNOSIS — Z8546 Personal history of malignant neoplasm of prostate: Secondary | ICD-10-CM | POA: Diagnosis not present

## 2020-06-23 DIAGNOSIS — G309 Alzheimer's disease, unspecified: Secondary | ICD-10-CM | POA: Diagnosis not present

## 2020-06-23 DIAGNOSIS — R634 Abnormal weight loss: Secondary | ICD-10-CM | POA: Diagnosis not present

## 2020-06-24 DIAGNOSIS — Z905 Acquired absence of kidney: Secondary | ICD-10-CM | POA: Diagnosis not present

## 2020-06-24 DIAGNOSIS — F0280 Dementia in other diseases classified elsewhere without behavioral disturbance: Secondary | ICD-10-CM | POA: Diagnosis not present

## 2020-06-24 DIAGNOSIS — Z8546 Personal history of malignant neoplasm of prostate: Secondary | ICD-10-CM | POA: Diagnosis not present

## 2020-06-24 DIAGNOSIS — R634 Abnormal weight loss: Secondary | ICD-10-CM | POA: Diagnosis not present

## 2020-06-24 DIAGNOSIS — G309 Alzheimer's disease, unspecified: Secondary | ICD-10-CM | POA: Diagnosis not present

## 2020-06-24 DIAGNOSIS — E46 Unspecified protein-calorie malnutrition: Secondary | ICD-10-CM | POA: Diagnosis not present

## 2020-06-25 DIAGNOSIS — E46 Unspecified protein-calorie malnutrition: Secondary | ICD-10-CM | POA: Diagnosis not present

## 2020-06-25 DIAGNOSIS — R634 Abnormal weight loss: Secondary | ICD-10-CM | POA: Diagnosis not present

## 2020-06-25 DIAGNOSIS — Z905 Acquired absence of kidney: Secondary | ICD-10-CM | POA: Diagnosis not present

## 2020-06-25 DIAGNOSIS — G309 Alzheimer's disease, unspecified: Secondary | ICD-10-CM | POA: Diagnosis not present

## 2020-06-25 DIAGNOSIS — Z8546 Personal history of malignant neoplasm of prostate: Secondary | ICD-10-CM | POA: Diagnosis not present

## 2020-06-25 DIAGNOSIS — F0280 Dementia in other diseases classified elsewhere without behavioral disturbance: Secondary | ICD-10-CM | POA: Diagnosis not present

## 2020-06-26 DIAGNOSIS — G309 Alzheimer's disease, unspecified: Secondary | ICD-10-CM | POA: Diagnosis not present

## 2020-06-26 DIAGNOSIS — R634 Abnormal weight loss: Secondary | ICD-10-CM | POA: Diagnosis not present

## 2020-06-26 DIAGNOSIS — Z905 Acquired absence of kidney: Secondary | ICD-10-CM | POA: Diagnosis not present

## 2020-06-26 DIAGNOSIS — E46 Unspecified protein-calorie malnutrition: Secondary | ICD-10-CM | POA: Diagnosis not present

## 2020-06-26 DIAGNOSIS — Z8546 Personal history of malignant neoplasm of prostate: Secondary | ICD-10-CM | POA: Diagnosis not present

## 2020-06-26 DIAGNOSIS — F0280 Dementia in other diseases classified elsewhere without behavioral disturbance: Secondary | ICD-10-CM | POA: Diagnosis not present

## 2020-07-03 DEATH — deceased
